# Patient Record
Sex: Male | Born: 1945
Health system: Southern US, Community
[De-identification: ages and names within clinical notes are randomized; demographics above are authoritative.]

## PROBLEM LIST (undated history)

## (undated) DIAGNOSIS — H409 Unspecified glaucoma: Secondary | ICD-10-CM

## (undated) DIAGNOSIS — R001 Bradycardia, unspecified: Secondary | ICD-10-CM

## (undated) DIAGNOSIS — E785 Hyperlipidemia, unspecified: Secondary | ICD-10-CM

## (undated) DIAGNOSIS — F17200 Nicotine dependence, unspecified, uncomplicated: Secondary | ICD-10-CM

## (undated) DIAGNOSIS — I739 Peripheral vascular disease, unspecified: Secondary | ICD-10-CM

## (undated) DIAGNOSIS — I10 Essential (primary) hypertension: Secondary | ICD-10-CM

## (undated) HISTORY — DX: Hyperlipidemia, unspecified: E78.5

## (undated) HISTORY — DX: Bradycardia, unspecified: R00.1

## (undated) HISTORY — PX: REFRACTIVE SURGERY: SHX103

## (undated) HISTORY — DX: Nicotine dependence, unspecified, uncomplicated: F17.200

## (undated) HISTORY — DX: Unspecified glaucoma: H40.9

## (undated) HISTORY — DX: Peripheral vascular disease, unspecified: I73.9

---

## 2013-12-24 ENCOUNTER — Ambulatory Visit
Admission: RE | Admit: 2013-12-24 | Discharge: 2013-12-24 | Disposition: A | Discharge status: Home / Self Care | Payer: Medicare HMO | Source: Ambulatory Visit | Attending: Cardiology | Admitting: Cardiology

## 2013-12-24 ENCOUNTER — Other Ambulatory Visit: Payer: Self-pay | Admitting: Cardiology

## 2013-12-24 DIAGNOSIS — Z129 Encounter for screening for malignant neoplasm, site unspecified: Secondary | ICD-10-CM

## 2014-01-24 DIAGNOSIS — I10 Essential (primary) hypertension: Secondary | ICD-10-CM | POA: Diagnosis not present

## 2014-01-24 DIAGNOSIS — E780 Pure hypercholesterolemia: Secondary | ICD-10-CM | POA: Diagnosis not present

## 2014-01-24 DIAGNOSIS — Z136 Encounter for screening for cardiovascular disorders: Secondary | ICD-10-CM | POA: Diagnosis not present

## 2014-02-07 DIAGNOSIS — I739 Peripheral vascular disease, unspecified: Secondary | ICD-10-CM | POA: Diagnosis not present

## 2014-02-07 DIAGNOSIS — F17200 Nicotine dependence, unspecified, uncomplicated: Secondary | ICD-10-CM | POA: Diagnosis not present

## 2014-02-07 DIAGNOSIS — E780 Pure hypercholesterolemia: Secondary | ICD-10-CM | POA: Diagnosis not present

## 2014-02-07 DIAGNOSIS — I10 Essential (primary) hypertension: Secondary | ICD-10-CM | POA: Diagnosis not present

## 2014-02-15 DIAGNOSIS — I739 Peripheral vascular disease, unspecified: Secondary | ICD-10-CM | POA: Diagnosis not present

## 2014-02-24 DIAGNOSIS — E780 Pure hypercholesterolemia: Secondary | ICD-10-CM | POA: Diagnosis not present

## 2014-02-24 DIAGNOSIS — I739 Peripheral vascular disease, unspecified: Secondary | ICD-10-CM | POA: Diagnosis not present

## 2014-02-24 DIAGNOSIS — I10 Essential (primary) hypertension: Secondary | ICD-10-CM | POA: Diagnosis not present

## 2014-02-24 DIAGNOSIS — F17200 Nicotine dependence, unspecified, uncomplicated: Secondary | ICD-10-CM | POA: Diagnosis not present

## 2014-03-08 DIAGNOSIS — I10 Essential (primary) hypertension: Secondary | ICD-10-CM | POA: Diagnosis not present

## 2014-03-08 DIAGNOSIS — E780 Pure hypercholesterolemia: Secondary | ICD-10-CM | POA: Diagnosis not present

## 2014-03-08 DIAGNOSIS — Z01810 Encounter for preprocedural cardiovascular examination: Secondary | ICD-10-CM | POA: Diagnosis not present

## 2014-03-15 ENCOUNTER — Encounter (HOSPITAL_COMMUNITY)
Admission: RE | Disposition: A | Discharge status: Home / Self Care | Payer: Self-pay | Source: Ambulatory Visit | Attending: Cardiology

## 2014-03-15 ENCOUNTER — Encounter (HOSPITAL_COMMUNITY): Payer: Self-pay | Admitting: Cardiology

## 2014-03-15 ENCOUNTER — Ambulatory Visit (HOSPITAL_COMMUNITY)
Admission: RE | Admit: 2014-03-15 | Discharge: 2014-03-15 | Disposition: A | Discharge status: Home / Self Care | Payer: Commercial Managed Care - HMO | Source: Ambulatory Visit | Attending: Cardiology | Admitting: Cardiology

## 2014-03-15 DIAGNOSIS — J449 Chronic obstructive pulmonary disease, unspecified: Secondary | ICD-10-CM | POA: Diagnosis not present

## 2014-03-15 DIAGNOSIS — I70213 Atherosclerosis of native arteries of extremities with intermittent claudication, bilateral legs: Secondary | ICD-10-CM | POA: Insufficient documentation

## 2014-03-15 DIAGNOSIS — Z79899 Other long term (current) drug therapy: Secondary | ICD-10-CM | POA: Insufficient documentation

## 2014-03-15 DIAGNOSIS — I739 Peripheral vascular disease, unspecified: Secondary | ICD-10-CM | POA: Diagnosis not present

## 2014-03-15 DIAGNOSIS — F17210 Nicotine dependence, cigarettes, uncomplicated: Secondary | ICD-10-CM | POA: Diagnosis not present

## 2014-03-15 DIAGNOSIS — I7092 Chronic total occlusion of artery of the extremities: Secondary | ICD-10-CM | POA: Diagnosis not present

## 2014-03-15 DIAGNOSIS — I10 Essential (primary) hypertension: Secondary | ICD-10-CM | POA: Diagnosis not present

## 2014-03-15 DIAGNOSIS — E780 Pure hypercholesterolemia: Secondary | ICD-10-CM | POA: Insufficient documentation

## 2014-03-15 DIAGNOSIS — Z7982 Long term (current) use of aspirin: Secondary | ICD-10-CM | POA: Diagnosis not present

## 2014-03-15 HISTORY — PX: LOWER EXTREMITY ANGIOGRAM: SHX5508

## 2014-03-15 HISTORY — PX: INSERTION OF ILIAC STENT: SHX6256

## 2014-03-15 SURGERY — ANGIOGRAM, LOWER EXTREMITY
Anesthesia: LOCAL

## 2014-03-15 MED ORDER — LIDOCAINE HCL (PF) 1 % IJ SOLN
INTRAMUSCULAR | Status: AC
  Filled 2014-03-15: qty 30

## 2014-03-15 MED ORDER — SODIUM CHLORIDE 0.9 % IV SOLN: INTRAVENOUS | Status: DC

## 2014-03-15 MED ORDER — MIDAZOLAM HCL 2 MG/2ML IJ SOLN
INTRAMUSCULAR | Status: AC
  Filled 2014-03-15: qty 2

## 2014-03-15 MED ORDER — NITROGLYCERIN IN D5W 200-5 MCG/ML-% IV SOLN
INTRAVENOUS | Status: AC
  Filled 2014-03-15: qty 250

## 2014-03-15 MED ORDER — HEPARIN (PORCINE) IN NACL 2-0.9 UNIT/ML-% IJ SOLN
INTRAMUSCULAR | Status: AC
  Filled 2014-03-15: qty 1000

## 2014-03-15 MED ORDER — HEPARIN SODIUM (PORCINE) 1000 UNIT/ML IJ SOLN
INTRAMUSCULAR | Status: AC
  Filled 2014-03-15: qty 1

## 2014-03-15 MED ORDER — CLOPIDOGREL BISULFATE 300 MG PO TABS
ORAL_TABLET | ORAL | Status: AC
  Filled 2014-03-15: qty 2

## 2014-03-15 MED ORDER — SODIUM CHLORIDE 0.9 % IV SOLN: 1.0000 mL/kg/h | INTRAVENOUS | Status: DC

## 2014-03-15 MED ORDER — HEPARIN (PORCINE) IN NACL 2-0.9 UNIT/ML-% IJ SOLN
INTRAMUSCULAR | Status: AC
  Filled 2014-03-15: qty 500

## 2014-03-15 MED ORDER — HYDROMORPHONE HCL 1 MG/ML IJ SOLN
INTRAMUSCULAR | Status: AC
  Filled 2014-03-15: qty 1

## 2014-03-15 MED ORDER — VERAPAMIL HCL 2.5 MG/ML IV SOLN
INTRAVENOUS | Status: AC
  Filled 2014-03-15: qty 2

## 2014-03-15 NOTE — Discharge Instructions (Signed)
Angiogram, Care After °Refer to this sheet in the next few weeks. These instructions provide you with information on caring for yourself after your procedure. Your health care provider may also give you more specific instructions. Your treatment has been planned according to current medical practices, but problems sometimes occur. Call your health care provider if you have any problems or questions after your procedure.  °WHAT TO EXPECT AFTER THE PROCEDURE °After your procedure, it is typical to have the following sensations: °· Minor discomfort or tenderness and a small bump at the catheter insertion site. The bump should usually decrease in size and tenderness within 1 to 2 weeks. °· Any bruising will usually fade within 2 to 4 weeks. °HOME CARE INSTRUCTIONS  °· You may need to keep taking blood thinners if they were prescribed for you. Take medicines only as directed by your health care provider. °· Do not apply powder or lotion to the site. °· Do not take baths, swim, or use a hot tub until your health care provider approves. °· You may shower 24 hours after the procedure. Remove the bandage (dressing) and gently wash the site with plain soap and water. Gently pat the site dry. °· Inspect the site at least twice daily. °· Limit your activity for the first 48 hours. Do not bend, squat, or lift anything over 20 lb (9 kg) or as directed by your health care provider. °· Plan to have someone take you home after the procedure. Follow instructions about when you can drive or return to work. °SEEK MEDICAL CARE IF: °· You get light-headed when standing up. °· You have drainage (other than a small amount of blood on the dressing). °· You have chills. °· You have a fever. °· You have redness, warmth, swelling, or pain at the insertion site. °SEEK IMMEDIATE MEDICAL CARE IF:  °· You develop chest pain or shortness of breath, feel faint, or pass out. °· You have bleeding, swelling larger than a walnut, or drainage from the  catheter insertion site. °· You develop pain, discoloration, coldness, or severe bruising in the leg or arm that held the catheter. °· You have heavy bleeding from the site. If this happens, hold pressure on the site and call 911. °MAKE SURE YOU: °· Understand these instructions. °· Will watch your condition. °· Will get help right away if you are not doing well or get worse. °Document Released: 07/12/2004 Document Revised: 05/10/2013 Document Reviewed: 05/18/2012 °ExitCare® Patient Information ©2015 [employer]. This information is not intended to replace advice given to you by your health care provider. Make sure you discuss any questions you have with your health care provider. ° °

## 2014-03-15 NOTE — Interval H&P Note (Signed)
History and Physical Interval Note:  03/15/2014 7:50 AM  Timothy Webster  has presented today for surgery, with the diagnosis of pvd  The various methods of treatment have been discussed with the patient and family. After consideration of risks, benefits and other options for treatment, the patient has consented to  Procedure(s): LOWER EXTREMITY ANGIOGRAM (N/A) and possible PTA as a surgical intervention .  The patient's history has been reviewed, patient examined, no change in status, stable for surgery.  I have reviewed the patient's chart and labs.  Questions were answered to the patient's satisfaction.     Pamella PertGANJI,[name]

## 2014-03-15 NOTE — H&P (Signed)
  Please see office visit notes for complete details of HPI.  

## 2014-03-15 NOTE — CV Procedure (Signed)
Procedures performed:  1. Right femoral Arterial access 2. Abdominal aortogram, abdominal aortogram with bifemoral runoff. 3. Crossover from right to the left  femoral artery placement of catheter tip in the left common  femoral artery.  4. Left femoral arteriogram with distal runoff 5. PTA and stenting of the left external iliac artery with implantation of 2 overlapping 9.0 x 60 and 9.0 x 20 mm absolute Pro self-expanding stents. PTCA and diamondback atherectomy of the left distal CTO of left SFA with 1.5 mm CSI  Stealth Solid crown, PTA and atherectomy with the same crown of the left tibioperoneal trunk followed by PTA and balloon angioplasty of the left SFA with a 5.0 x 16 mm Saber balloon, and PTA and balloon angioplasty of the left tibioperoneal trunk with a 2.5 x 40 mm chocolate balloon. 6. Right femoral arteriogram and closure of right femoral arterial access with Perclose. Fairly complex and prolonged procedure due to multiple catheter exchange and wire exchange.   Indication: Claudication,  Abnormal LE arterial duplex.  patient is a 69 year old African-American male with history of hypertension, hyperlipidemia and tobacco use disorder who presents with severe lifestyle limiting claudication with markedly abnormal bilateral ABI at 0.3, due to symptoms of ongoing severe claudication, he was brought to the peripheral angiography suite to eval at his peripheral anatomy.   Peripheral arthrogram: No evidence of abdominal aneurysm. 2 renal arteries one on either side and they're widely patent.   Aortoiliac bifurcation was widely patent. there is mild atherosclerotic changes of the abdominal aorta. The right and left external iliac artery showed a 50-60% stenosis, careful pullback was performed across both the lesions, there was a 40 mmHg pressure gradient across the left iliac artery stenosis, there was no gradient across the right iliac artery.   Femoral arteriogram: the right SFA shows similar  diffuse calcification. Midsegment of the right SFA has a CTO. Below the right knee there is two-vessel runoff in the form of peroneal and posterior tibial artery. Anterior tibial artery is severely diffusely diseased and is occluded and reconstitutes via collaterals and fills faintly at the level of the ankle.  The left SFA shows severe diffuse calcification, there is a 60% stenosis in the midsegment followed by distal SFA which is CTO and calcified. Below the left knee, the anterior tibial artery is again severely diseased and calcified and occluded and reconstitutes via collaterals. The tibioperoneal trunk has a high-grade 99% calcific stenosis.   Interventional data:  SUCCESSFUL PTA and stenting of the left external iliac artery with implantation of 2 overlapping 9.0 x 60 and 9.0 x 20 mm absolute Pro self-expanding stents. PTCA and diamondback atherectomy of the left distal CTO of left SFA with 1.5 mm CSI  Stealth Solid crown, PTA and atherectomy with the same crown of the left tibioperoneal trunk followed by PTA and balloon angioplasty of the left SFA with a 5.0 x 16 mm Saber balloon, and PTA and balloon angioplasty of the left tibioperoneal trunk with a 2.5 x 40 mm chocolate balloon.  Recommendation: Patient due to symptoms of claudication will need right SFA angioplasty in a elective fashion.   TECHNIQUE OF THE  PROCEDURE: Under sterile precautions using a 5-French  rightal arterial access, a 5 Frenchpigtailatheter was advanced  into the ascending aorta and arteriogram was performed.  the catheter was then utilized to perform abdominal aortogram with bifemoral runoff.  Using the same catheter, I cross the left iliac artery with the help of a Versacore wire and pigtail catheter. The  catheter was then exchanged to a 5 [place] end hole catheter. The careful pullback across the stenosis in the iliac artery revealed a 40 mmHg pressure gradient.  I decided to proceed with intervention to the left  external iliac artery. A 45 cm 7 French Ansel sheath was advanced into the left common iliac artery and then angioplasty was performed via this catheter sheath. Direct stenting was performed with utilization of 2 overlapping self-expanding 9.0 x 60 and a 9.0 x 20 mm absolute Pro stents. The stents were postdilated with a 8.0 x 60 mm  Armada balloon which was performed at at 10 atmospheric pressure 2. The inflow the stent had to be stented again due to short length of the first stent that was placed and there was a lesion in the inflow. Excellent results was obtained.  Attention was then directed towards the right external iliac artery stenosis. Careful pullback was performed across the right iliac stenosis keeping the left femoral arterial access intact with palpable Versacore wire. It was not significant stenosis. Then I re-advance the sheath back into the left common femoral artery protecting the recently placed self-expanding stent carefully under fluoroscopic guidance. I utilized a 5 [place] KMP catheter to access the SFA and advanced the guidewire into the SFA.  Utilizing a 5 French glide cath and hole catheter, 300 cm 0.035 inch Glidewire, I carefully advanced to the site of CTO in the left distal SFA/proximal popliteal artery under fluoroscopic guidance. I was unable to cross the 5 French sheath at the site of CTO. Then I utilized a Cook CXR 4 [place] support catheter, with moderate amount of difficulty and was able to cross the CTO. Then I utilized 0.014 x 300 cm Viper guidewire and placed the tip of the guidewire carefully into the peroneal artery. Then utilizing 1.5 mm solid crown, careful atherectomy was performed of the left distal SFA and proximal popliteal artery followed by atherectomy of the tibioperoneal trunk. High-speed was utilized up to 120000 rotations per second in the popliteal SFA region and 90,000. Into the tibioperoneal trunk after having passed multiple times, intra-arterial  nitroglycerin was administered and angiography was performed. Excellent result was evident at atherectomy site without any evidence of dissection. I then utilized a 2.5 x 40 mm chocolate balloon to perform angioplasty of the left tibioperoneal trunk which was performed at 15 atmospheric pressure for 2 minutes. The same balloon was utilized to perform angioplasty at the distal SFA/proximal popliteal artery at 15 atmospheric pressure for 90 seconds. Following this I utilized a 5.0 x 60 mm Saber balloon and balloon angioplasty at 8 atmospheric pressure for 1 minute each 2 was performed at the site of atherectomy. I then utilized the same balloon to perform angioplasty at 50-60% stenosis in the midsegment of the SFA which was again performed at 7 atmospheric pressure for 1 minute each 2. Following this angiography revealed excellent result, brisk flow without evidence of any dissection. The guidewire was withdrawn, the Ancel sheath was withdrawn back into the right common femoral artery, right femoral arteriogram was performed and access closed with Perclose with excellent hemostasis. Patient tolerated the procedure well. There was no immediate competitions. A  348 mL of contrast was utilized for diagnostic and procedure.

## 2014-03-17 LAB — POCT ACTIVATED CLOTTING TIME
ACTIVATED CLOTTING TIME: 239 s
ACTIVATED CLOTTING TIME: 300 s
Activated Clotting Time: 263 seconds
Activated Clotting Time: 288 seconds

## 2014-03-23 DIAGNOSIS — I739 Peripheral vascular disease, unspecified: Secondary | ICD-10-CM | POA: Diagnosis not present

## 2014-03-23 DIAGNOSIS — F17200 Nicotine dependence, unspecified, uncomplicated: Secondary | ICD-10-CM | POA: Diagnosis not present

## 2014-03-23 DIAGNOSIS — I10 Essential (primary) hypertension: Secondary | ICD-10-CM | POA: Diagnosis not present

## 2014-04-20 DIAGNOSIS — I10 Essential (primary) hypertension: Secondary | ICD-10-CM | POA: Diagnosis not present

## 2014-04-20 DIAGNOSIS — I739 Peripheral vascular disease, unspecified: Secondary | ICD-10-CM | POA: Diagnosis not present

## 2014-04-23 NOTE — H&P (Signed)
Notes from office copied to [facility] L. Scobie Mar 31, 2014 11:37 AM Location: [facility] Patient #: [phone] DOB: [birth year] Married / Language: English / Race: Black or African American Male    History of Present Illness(Jagadeesh Tomasita Crumble, MD; 2014/03/31 8:55 PM) Patient words: 7-10 day F/U post cath  The patient is a 69 year old male who presents with peripheral vascular disease. Patient presents here for follow-up and evaluation of peripheral arterial disease. Underwent left iliac artery stenting and left SFA and walk smoke popliteal and tibioperoneal trunk up for me with 1.5 mm diamondback,. He has residual right SFA CTO and needs angioplasty for the same. He now presents here for follow-up.  His wife is present at the bedside, states that his symptoms of leg claudication has improved remarkably. He is now able to walk significantly much more than previously but still has mild limitations due to claudication of his right Calf. He has not noticed any bluish discoloration or ulcerations. He has reduced his smoking to about 2-3 cigarettes a day and is having difficulty quitting smoking. Presents here for follow-up. Has chronic mild dyspnea, but no chest pain or palpitation. No leg edema. States that he has not had any complications from the procedure.     Problem List/Past Medical(Charavina Reader; Mar 31, 2014 11:41 AM) Tobacco use disorder, severe, dependence (F17.200). 45 pack year history of smoking  Chest x-ray 12/24/2013: COPD. There is no evidence of pneumonia, CHF, or other acute cardiopulmonary abnormality. Hypercholesteremia (E78.0). Labs 12/24/2013: thyroid panel normal, total cholesterol 174, triglycerides 67, HDL 59, LDL 102 PAD (peripheral artery disease) (I73.9). Labs 03/08/2014: HB 12.5/HCT 38.7 with normocytic indices, PT 12.9, INR 0.97, creatinine 0.77, BMP normal  Lower extremity arterial duplex 02/15/2014: Study suggests  occlusion/near occlusion of the mid SFA bilaterally with severe calcific diffuse plaque. This exam reveals severely decreased perfusion of both the lower extremities, noted at the post tibial artery level with bilateral ABI of 0.37. Consider further work-up.  ABI 01/04/2014: This exam reveals moderately decreased perfusion of both the lower extremities, noted at the post tibial artery level. Right ABI 0.64 and left 0.52 with monophasic waveforms suggesting severe disease. Consider complete evaluation of the Lower extremities if clinically indicated. Essential hypertension, benign (I10). Labs 12/24/2013: Creatinine 0.85, CMP normal  Treadmill Exercise stress 01/24/2014: Indications: Assessment of Chest Pain. Conclusions: Negative for ischemia. Normal exercise tolerence. The patient exercised according to the Bruce protocol, Total time recorded 7 Min. 37 sec. achieving a max heart rate of 153 which was 101% of MPHR for age and 8.8 METS of work. Baseline NIBP was 126/64. Peak NIBP was 126/64 MaxSysp was: 160 MaxDiasp was: 64. The baseline ECG showed NSR,Normal ECG. During exercise there was no ST-T changes of ischemia. Symptoms: MPHR (100%) achieved. Mild dyspnea. Arrhythmia: Occasional PVC, PAC. Continue primary prevention.    Allergies(Charavina Reader; 03/31/2014 11:41 AM) No Known Drug Allergies. 12/24/2013    Family History(Charavina Reader; 03/31/2014 11:41 AM) Mother. Deceased. at age 8, from cancer. Father. Deceased. father died when pt was a small child, does not know any information on him Siblings. 2 brothers, younger    Social History(Charavina Reader; 31-Mar-2014 11:41 AM) Marital status. Married. Number of Children. 1. Current tobacco use. Current every day smoker. 1 ppd smoker, for 20 years; 1/2 PPD Non Drinker/No Alcohol Use Living Situation. Lives with spouse.    Past Surgical History(Charavina Reader; March 31, 2014 11:41 AM) None.  12/24/2013    Medication History(Charavina Reader; 03/31/14 11:42 AM) Cilostazol (  Tablet, 1 (  one) Tablet Oral two times daily, Taken starting 03/21/2014) Active. Amlodipine Besy-Benazepril HCl (10-40MG  Capsule, 1 (one) Oral daily, Taken starting 02/07/2014) Active. Aspirin (81MG  Tablet, 1 Oral daily) Active.    Diagnostic Studies History(Jagadeesh Tomasita Crumble Derin Matthes, MD; 03/23/2014 9:04 PM) Colonoscopy. 2010 Normal ABI's. 01/04/2014 This exam reveals moderately decreased perfusion of both the lower extremities, noted at the post tibial artery level. Right ABI 0.64 and left 0.52 with monophasic waveforms suggesting severe disease. Consider complete evaluation of the Lower extremities if clinically indicated. Treadmill stress test. 01/24/2014 Indications: Assessment of Chest Pain. Conclusions: Negative for ischemia. Normal exercise tolerence. The patient exercised according to the Bruce protocol, Total time recorded 7 Min. 37 sec. achieving a max heart rate of 153 which was 101% of MPHR for age and 8.8 METS of work. Baseline NIBP was 126/64. Peak NIBP was 126/64 MaxSysp was: 160 MaxDiasp was: 64. The baseline ECG showed NSR,Normal ECG. During exercise there was no ST-T changes of ischemia. Symptoms: MPHR (100%) achieved. Mild dyspnea. Arrhythmia: Occasional PVC, PAC. Continue primary prevention. Angiography. 03/15/2014 Peripheral arteriogram 03/15/2014: Left external iliac artery 9.0 x 60, 9.0 x 20 mm self-expanding stent. Left distal SFA and proximal popliteal and tibioperoneal trunk    Review of Systems(Jagadeesh Tomasita Crumble Reyne Falconi, MD; 03/23/2014 8:55 PM) General:Present- Feeling well. Not Present- Fatigue, Fever and Night Sweats. Skin:Not Present- Itching and Rash. HEENT:Not Present- Headache. Respiratory:Not Present- Difficulty Breathing. Cardiovascular:Present- Claudications. Not Present- Chest Pain, Fainting, Orthopnea and Swelling of Extremities. Gastrointestinal:Not  Present- Abdominal Pain, Constipation, Diarrhea, Nausea and Vomiting. Musculoskeletal:Not Present- Joint Swelling. Neurological:Not Present- Headaches. Hematology:Not Present- Blood Clots, Easy Bruising and Nose Bleed. All other systems negative    Vitals(Charavina Reader; 03/23/2014 11:41 AM) 03/23/2014 11:38 AM Weight: 126 lb Height: 66 in Body Surface Area: 1.63 m Body Mass Index: 20.34 kg/m Pulse: 63 (Regular) P.OX: 98% (Room air) BP: 124/68 (Sitting, Left Arm, Standard)     Physical Exam(Jagadeesh Tomasita Crumble Dorthula Bier, MD; 03/23/2014 9:02 PM) The physical exam findings are as follows:   General Mental Status- Alert. General Appearance- Cooperative and Appears stated age. Not in acute distress. Orientation- Oriented X3. Build & Nutrition- Lean and Well built.   Head and Neck Thyroid Gland Characteristics- no palpable nodules. no palpable enlargement.   Eye Cornea- Left- Opaque.   Chest and Lung Exam Inspection:Shape- Barrel-shaped. Palpation:Tender- No chest wall tenderness. Auscultation: Breath sounds:- Clear.   Cardiovascular Inspection:Jugular vein- Right- No Distention. Auscultation:Heart Sounds- S1 WNL, S2 WNL and No gallop present. Murmurs & Other Heart Sounds: Murmur:- No murmur.   Abdomen Palpation/Percussion:Palpation and Percussion of the abdomen reveal - Non Tender and No hepatosplenomegaly. Auscultation:Auscultation of the abdomen reveals - Bowel sounds normal.   Peripheral Vascular Lower Extremity:Inspection- Left- No Pigmentation or Varicose veins. Right- No Pigmentation or Varicose veins. Palpation:Temperature- Left- Normal. Right- Cool. Edema- Bilateral- No edema. Femoral pulse- Left- Normal. Right- Normal. Bilateral- Bruit. Popliteal pulse- Bilateral- 0+. Dorsalis pedis pulse- Left- 1/2+. Right- 0+. Posterior tibial pulse- Bilateral- 0+. Carotid arteries- Left- No Carotid bruit.  Right- No Carotid bruit. Abdomen- No prominent abdominal aortic pulsation or epigastric bruit.   Neurologic Motor:- Grossly intact without any focal deficits.   Musculoskeletal Global Assessment Left Lower Extremity - normal range of motion without pain. Right Lower Extremity- normal range of motion without pain.    Assessment & Plan(Jagadeesh Tomasita Crumble Riannon Mukherjee, MD; 03/23/2014 9:03 PM) PAD (peripheral artery disease) (I73.9) Story: Lower extremity arterial duplex 02/15/2014: Study suggests occlusion/near occlusion of the mid SFA bilaterally with severe calcific diffuse plaque. This exam  reveals severely decreased perfusion of both the lower extremities, noted at the post tibial artery level with bilateral ABI of 0.37. Consider further work-up.  ABI 01/04/2014: This exam reveals moderately decreased perfusion of both the lower extremities, noted at the post tibial artery level. Right ABI 0.64 and left 0.52 with monophasic waveforms suggesting severe disease. Consider complete evaluation of the Lower extremities if clinically indicated. Impression: Peripheral arteriogram 03/15/2014: Left external iliac artery 9.0 x 60, 9.0 x 20 mm self-expanding stent. Left distal SFA and proximal popliteal and tibioperoneal trunk atherectomy with CSI 1.5 mm Crown, balloon angioplasty of the same. Right SFA CTO, needs angioplasty. 2 was runoff below the knee in the form of posterior tibial and peroneal arteries. Current Plans l Continued Cilostazol , 1 (one) Tablet Tablet two times daily, Mail Order #[id number], 03/23/2014, Ref. x1. l Restarted Pravastatin Sodium , 1 Tablet at bedtime, #30, 30 days starting 03/23/2014, No Refill, Mail Order #90, Ref. x1.  Essential hypertension, benign (I10) Story: Labs 03/08/2014: HB 12.5/HCT 38.7 with normocytic indices, PT 12.9, INR 0.97, creatinine 0.77, BMP normal  Treadmill Exercise stress 01/24/2014: Indications: Assessment of Chest Pain. Conclusions: Negative for  ischemia. Normal exercise tolerence. The patient exercised according to the Bruce protocol, Total time recorded 7 Min. 37 sec. achieving a max heart rate of 153 which was 101% of MPHR for age and 8.8 METS of work. Baseline NIBP was 126/64. Peak NIBP was 126/64 MaxSysp was: 160 MaxDiasp was: 64. The baseline ECG showed NSR,Normal ECG. During exercise there was no ST-T changes of ischemia. Symptoms: MPHR (100%) achieved. Mild dyspnea. Arrhythmia: Occasional PVC, PAC. Continue primary prevention. Impression: EKG 12/24/2013: Marked sinus bradycardia at the rate of 39 bpm, normal U waves evident. Otherwise normal EKG.  Tobacco use disorder, severe, dependence (F17.200) Story: 45 pack year history of smoking  Chest x-ray 12/24/2013: COPD. There is no evidence of pneumonia, CHF, or other acute cardiopulmonary abnormality. Current Plans l Mechanism of underlying disease process and action of medications discussed with the patient. I discussed primary/secondary prevention and also dietary counceling was done. Patient presents to me for follow-up of peripheral arterial disease. He underwent peripheral arteriogram and successful angioplasty but complex angioplasty to his left iliac artery and also proximal popliteal and tibioperoneal trunk atherectomy. Since then he has noticed significant improvement in symptoms of claudication. He still has mild symptoms of claudication in his right leg. I discussed with him regarding proceeding with angiography, he would like to wait for 4-5 weeks for traveling plans the recommend up soon. He is the right foot is still cold but is not in any acute limb threatening ischemia, left foot is warm and has faint pedal pulses. I have again discussed with him regarding smoking cessation, offered him medication. Patient would like to try patches and he would like to by them on his own. I'll see him back after peripheral arteriogram and possible angioplasty, I will obtain CMP along with  lipid profile testing at that time. 03/23/2014  l Referred to [name] MD (Internal Medicine).  Addendum: Due to persistent symptoms of claudication right leg proceed with repeat angiography and possible angioplasty.

## 2014-04-25 MED ORDER — SODIUM CHLORIDE 0.9 % IV SOLN
INTRAVENOUS | Status: DC
Start: 2014-04-26 — End: 2014-04-26
  Administered 2014-04-26: 06:00:00 via INTRAVENOUS

## 2014-04-26 ENCOUNTER — Encounter (HOSPITAL_COMMUNITY): Payer: Self-pay | Admitting: Cardiology

## 2014-04-26 ENCOUNTER — Ambulatory Visit (HOSPITAL_COMMUNITY)
Admission: RE | Admit: 2014-04-26 | Discharge: 2014-04-26 | Disposition: A | Discharge status: Home / Self Care | Payer: Commercial Managed Care - HMO | Source: Ambulatory Visit | Attending: Cardiology | Admitting: Cardiology

## 2014-04-26 ENCOUNTER — Encounter (HOSPITAL_COMMUNITY)
Admission: RE | Disposition: A | Discharge status: Home / Self Care | Payer: Self-pay | Source: Ambulatory Visit | Attending: Cardiology

## 2014-04-26 DIAGNOSIS — E780 Pure hypercholesterolemia: Secondary | ICD-10-CM | POA: Diagnosis not present

## 2014-04-26 DIAGNOSIS — I10 Essential (primary) hypertension: Secondary | ICD-10-CM | POA: Insufficient documentation

## 2014-04-26 DIAGNOSIS — J449 Chronic obstructive pulmonary disease, unspecified: Secondary | ICD-10-CM | POA: Insufficient documentation

## 2014-04-26 DIAGNOSIS — I739 Peripheral vascular disease, unspecified: Secondary | ICD-10-CM | POA: Insufficient documentation

## 2014-04-26 DIAGNOSIS — I70211 Atherosclerosis of native arteries of extremities with intermittent claudication, right leg: Secondary | ICD-10-CM | POA: Diagnosis not present

## 2014-04-26 DIAGNOSIS — F17210 Nicotine dependence, cigarettes, uncomplicated: Secondary | ICD-10-CM | POA: Insufficient documentation

## 2014-04-26 DIAGNOSIS — I771 Stricture of artery: Secondary | ICD-10-CM | POA: Diagnosis not present

## 2014-04-26 HISTORY — PX: LOWER EXTREMITY ANGIOGRAM: SHX5508

## 2014-04-26 LAB — POCT ACTIVATED CLOTTING TIME
ACTIVATED CLOTTING TIME: 257 s
ACTIVATED CLOTTING TIME: 190 s
Activated Clotting Time: 251 seconds
Activated Clotting Time: 319 seconds
Activated Clotting Time: 165 seconds

## 2014-04-26 SURGERY — ANGIOGRAM, LOWER EXTREMITY
Anesthesia: LOCAL | Laterality: Right

## 2014-04-26 MED ORDER — HEPARIN SODIUM (PORCINE) 1000 UNIT/ML IJ SOLN
INTRAMUSCULAR | Status: AC
  Filled 2014-04-26: qty 1

## 2014-04-26 MED ORDER — HEPARIN (PORCINE) IN NACL 2-0.9 UNIT/ML-% IJ SOLN
INTRAMUSCULAR | Status: AC
Start: 2014-04-26 — End: 2014-04-26
  Filled 2014-04-26: qty 500

## 2014-04-26 MED ORDER — NITROGLYCERIN IN D5W 200-5 MCG/ML-% IV SOLN
INTRAVENOUS | Status: AC
  Filled 2014-04-26: qty 250

## 2014-04-26 MED ORDER — VERAPAMIL HCL 2.5 MG/ML IV SOLN
INTRAVENOUS | Status: AC
  Filled 2014-04-26: qty 2

## 2014-04-26 MED ORDER — HEPARIN (PORCINE) IN NACL 2-0.9 UNIT/ML-% IJ SOLN
INTRAMUSCULAR | Status: AC
  Filled 2014-04-26: qty 1000

## 2014-04-26 MED ORDER — CLOPIDOGREL BISULFATE 300 MG PO TABS
ORAL_TABLET | ORAL | Status: AC
  Filled 2014-04-26: qty 2

## 2014-04-26 MED ORDER — LIDOCAINE HCL (PF) 1 % IJ SOLN
INTRAMUSCULAR | Status: AC
  Filled 2014-04-26: qty 30

## 2014-04-26 MED ORDER — CLOPIDOGREL BISULFATE 75 MG PO TABS: 75.0000 mg | ORAL_TABLET | Freq: Every day | ORAL | Status: DC

## 2014-04-26 MED ORDER — HYDROMORPHONE HCL 1 MG/ML IJ SOLN
INTRAMUSCULAR | Status: AC
  Filled 2014-04-26: qty 1

## 2014-04-26 MED ORDER — FAMOTIDINE IN NACL 20-0.9 MG/50ML-% IV SOLN
INTRAVENOUS | Status: AC
  Filled 2014-04-26: qty 50

## 2014-04-26 MED ORDER — SODIUM CHLORIDE 0.9 % IV SOLN: 1.0000 mL/kg/h | INTRAVENOUS | Status: DC

## 2014-04-26 MED ORDER — MIDAZOLAM HCL 2 MG/2ML IJ SOLN
INTRAMUSCULAR | Status: AC
  Filled 2014-04-26: qty 2

## 2014-04-26 NOTE — Interval H&P Note (Signed)
History and Physical Interval Note:  04/26/2014 7:47 AM  Timothy Webster  has presented today for surgery, with the diagnosis of pad  The various methods of treatment have been discussed with the patient and family. After consideration of risks, benefits and other options for treatment, the patient has consented to  Procedure and Medical resident Dr. Althea CharonKaramalegos for observation   [name]

## 2014-04-26 NOTE — Discharge Instructions (Signed)

## 2014-04-26 NOTE — CV Procedure (Addendum)
Procedure performed: Left femoral arterial access, cross over from the left into the right femoral artery, right femoral arteriogram with distal runoff, PTA and atherectomy of the right chronically occluded mid SFA and atherectomy of the proximal SFA and popliteal artery followed by balloon angioplasty. Extremely difficult, prolonged procedure and took approximately 2-1/2 hours due to complexity of the anatomy and multiple wire and devise exchanges.  Indication: Patient is a 69 year old African-American male with history of tobacco use disorder, known peripheral arterial disease with severe symptomatic claudication with severely decreased ABI bilaterally. ABI 0.3. He had undergone prior peripheral arteriogram and angioplasty 03/15/2014: Left external iliac artery 9.0 x 60 mm, 9.0 x 20 mm self-expanding stent, left distal SFA and proximal popliteal and tibioperoneal trunk atherectomy with CSI 1.5 mm solid crown.   Patient has residual right SFA occlusion, heavily calcified with diffuse disease. Due to symptomatic claudication and severely reduced ABI, he was now electively brought to the peripheral angiography suite to reevaluate his peripheral anatomy and attempted angioplasty if anatomy has not significantly changed.   Angiographic data: Right common and external iliac artery show moderate disease and calcification without high-grade stenosis. Right femoral artery has a calcific spicule in the proximal segment. Right SFA is severely diffusely diseased with heavy calcification followed by occlusion in the midsegment. It reconstitutes after a short segment occlusion, there is a large collateral that arises at the site of occlusion.  Below the knee, right ATA is occluded, right PT appears to fill at the level of the ankle and right peroneal is patent in the proximal segment and occluded or diffusely diseased in the midsegment distal segment. Very slow flow was evident and unable to completely evaluate the  anatomy due to markedly reduced flow.  Interventional data: Successful PTA, atherectomy of the right SFA and right popliteal artery with the CSI 1.5 mm solid crown atherectomy followed by balloon angioplasty with a 5.0 x 100 mm Saber. Stenosis reduced from 100% to less than 20% with brisk one-vessel runoff in the form of PT in the right lower extremity. The peroneal artery is also widely patent up to the mid to distal segment. ATA is occluded in the proximal segment.  Recommendation: Patient will need continued risk factor modification. He'll be discharged home today with outpatient follow-up. A total of 175 mL of contrast was utilized for diagnostic and interventional procedure.\  Technique: Under sterile precautions under fluoroscopic guidance, left femoral arterial access was obtained using syringe technique. A 7 French Ansel angled 45 cm sheath was advanced to the distal abdominal aorta and using a 5 French crossover catheter, I placed the Versacore wire into the right SFA. I then advanced the 5 [place] crossover catheter into the right external iliac artery. Because of inability to advance any further, a Glidewire had to be utilized to direct the catheter to the right SFA. The 7 French sheath was then placed in the right common femoral artery.  Using heparin 5 to correlation, maintaining his greater than 250, I then utilized a 5 [place] glide tip catheter to perform right femoral arteriogram with distal runoff. Then I utilized initially a whisper 0.014 x 300 cm wire and attempted to cross the CTO. I also utilized a angled CXI support catheter and with significant and great difficulty I was able to cross the flush occluded right SFA with the help of a MiracleBros 19 heavy wire, I was able to access the right distal SFA and right popliteal artery and enters the lumen. I was unable to  cross the CXI support catheter through the CTO, hence quick cross catheter was utilized to cross the CTO. The wire was  carefully positioned into the right popliteal artery distally. I then exchanged the heavy wire to a Viper advance guidewire, performed atherectomy with 1.5 mm solid crown. Multiple runs were made throughout the CTO and proximal to mid segment of SFA. This was followed by balloon angioplasty with a Saber 5.0 x 100 mm balloon at its atmospheric pressure for 3 minutes with gentle inflations. There was excellent result, after the angioplasty, proximal SFA and right proximal and mid popliteal artery high-grade stenosis and severe calcification were evident. Hence I performed atherectomy for the second time at these sites, followed by balloon angioplasty of the same balloon again at 6-7 atmospheric pressure for 3 minutes each this was followed by intra-arterial nitroglycerin administration and angiography. Excellent result with very brisk flow, one-vessel runoff below the knee, the peroneal artery is diseased distally, posterior tibial artery is widely patent. Anterior tibial is diffusely diseased and occluded in the proximal segment. Having performed this I withdrew the wire and the sheath back into the left femoral artery and exchanged it to a short 7 [place] sheath. Patient tolerated the procedure. The was no immediate competition.

## 2014-04-26 NOTE — Interval H&P Note (Signed)
History and Physical Interval Note:  04/26/2014 7:46 AM  Timothy Webster  has presented today for surgery, with the diagnosis of pad  The various methods of treatment have been discussed with the patient and family. After consideration of risks, benefits and other options for treatment, the patient has consented to  Procedure(s): LOWER EXTREMITY ANGIOGRAM (N/A) and possible PTA as a surgical intervention .  The patient's history has been reviewed, patient examined, no change in status, stable for surgery.  I have reviewed the patient's chart and labs.  Questions were answered to the patient's satisfaction.     Pamella PertGANJI,[name]

## 2014-04-26 NOTE — Progress Notes (Signed)
Site area: LFA Site Prior to Removal:  Level Risk analyst0Pressure Applied For:5725min Manual:  yes  Patient Status During Pull:  stable Post Pull Site:  Level 0 Post Pull Instructions Given:  yes Post Pull Pulses Present: palpable Dressing Applied:  clear Bedrest begins @ 1355 Comments:

## 2014-04-27 LAB — POCT ACTIVATED CLOTTING TIME: Activated Clotting Time: 282 seconds

## 2014-05-11 DIAGNOSIS — I739 Peripheral vascular disease, unspecified: Secondary | ICD-10-CM | POA: Diagnosis not present

## 2014-05-11 DIAGNOSIS — F17200 Nicotine dependence, unspecified, uncomplicated: Secondary | ICD-10-CM | POA: Diagnosis not present

## 2014-06-27 DIAGNOSIS — E785 Hyperlipidemia, unspecified: Secondary | ICD-10-CM | POA: Diagnosis not present

## 2014-06-27 DIAGNOSIS — I10 Essential (primary) hypertension: Secondary | ICD-10-CM | POA: Diagnosis not present

## 2014-06-29 DIAGNOSIS — I10 Essential (primary) hypertension: Secondary | ICD-10-CM | POA: Diagnosis not present

## 2014-06-29 DIAGNOSIS — E785 Hyperlipidemia, unspecified: Secondary | ICD-10-CM | POA: Diagnosis not present

## 2014-09-07 DIAGNOSIS — I739 Peripheral vascular disease, unspecified: Secondary | ICD-10-CM | POA: Diagnosis not present

## 2014-09-19 DIAGNOSIS — I739 Peripheral vascular disease, unspecified: Secondary | ICD-10-CM | POA: Diagnosis not present

## 2014-09-19 DIAGNOSIS — F17200 Nicotine dependence, unspecified, uncomplicated: Secondary | ICD-10-CM | POA: Diagnosis not present

## 2014-10-25 DIAGNOSIS — I10 Essential (primary) hypertension: Secondary | ICD-10-CM | POA: Diagnosis not present

## 2014-10-25 DIAGNOSIS — E785 Hyperlipidemia, unspecified: Secondary | ICD-10-CM | POA: Diagnosis not present

## 2014-11-28 DIAGNOSIS — Z6821 Body mass index (BMI) 21.0-21.9, adult: Secondary | ICD-10-CM | POA: Diagnosis not present

## 2014-11-28 DIAGNOSIS — N400 Enlarged prostate without lower urinary tract symptoms: Secondary | ICD-10-CM | POA: Diagnosis not present

## 2014-11-28 DIAGNOSIS — Z125 Encounter for screening for malignant neoplasm of prostate: Secondary | ICD-10-CM | POA: Diagnosis not present

## 2015-03-14 DIAGNOSIS — H5412 Blindness, left eye, low vision right eye: Secondary | ICD-10-CM | POA: Diagnosis not present

## 2015-03-14 DIAGNOSIS — I10 Essential (primary) hypertension: Secondary | ICD-10-CM | POA: Diagnosis not present

## 2015-03-14 DIAGNOSIS — E785 Hyperlipidemia, unspecified: Secondary | ICD-10-CM | POA: Diagnosis not present

## 2015-04-04 DIAGNOSIS — I739 Peripheral vascular disease, unspecified: Secondary | ICD-10-CM | POA: Diagnosis not present

## 2015-04-19 DIAGNOSIS — I10 Essential (primary) hypertension: Secondary | ICD-10-CM | POA: Diagnosis not present

## 2015-04-19 DIAGNOSIS — M7711 Lateral epicondylitis, right elbow: Secondary | ICD-10-CM | POA: Diagnosis not present

## 2015-04-19 DIAGNOSIS — I739 Peripheral vascular disease, unspecified: Secondary | ICD-10-CM | POA: Diagnosis not present

## 2015-04-19 DIAGNOSIS — E7800 Pure hypercholesterolemia, unspecified: Secondary | ICD-10-CM | POA: Diagnosis not present

## 2015-05-11 DIAGNOSIS — E7800 Pure hypercholesterolemia, unspecified: Secondary | ICD-10-CM | POA: Diagnosis not present

## 2015-05-11 DIAGNOSIS — I739 Peripheral vascular disease, unspecified: Secondary | ICD-10-CM | POA: Diagnosis not present

## 2015-05-14 NOTE — H&P (Signed)
OFFICE VISIT NOTES COPIED TO EPIC FOR DOCUMENTATION  .  History of Present Illness Timothy Webster([name]. Jidenna Figgs MD; 04/19/2015 4:17 PM) Patient words: Last O/V 09/19/2014; 6 Mth F/U for PAD.  The patient is a 70 year old male who presents for a Follow-up for Peripheral vascular disease. Patient presents here for follow-up and evaluation of peripheral arterial disease. On 03/25/2014, he underwent left iliac artery stenting and left SFA and proximal popliteal and tibioperoneal trunk atherectomy with 1.5 mm diamondback and on 04/26/2014 he underwent atherectomy and balloon angioplasty of the right SFA and right popliteal artery.  He states that his symptoms of leg claudication have improved but states that he still has some cramping and severe burning in both calves right slightly worse than left with activity, that appear to have comeback since last OV 3 months ago. He has not noticed any bluish discoloration or ulcerations. He has quit smoking since second procedure. Has chronic mild dyspnea, but no chest pain or palpitation. No leg edema.   Problem List/Past Medical (April Rinaldo RatelGarrison; 04/19/2015 3:41 PM) Essential hypertension, benign (I10)  Treadmill Exercise stress 01/24/2014: Indications: Assessment of Chest Pain. Conclusions: Negative for ischemia. Normal exercise tolerence. The patient exercised according to the Bruce protocol, Total time recorded 7 Min. 37 sec. achieving a max heart rate of 153 which was 101% of MPHR for age and 8.8 METS of work. Baseline NIBP was 126/64. Peak NIBP was 126/64 MaxSysp was: 160 MaxDiasp was: 64. The baseline ECG showed NSR,Normal ECG. During exercise there was no ST-T changes of ischemia. Symptoms: MPHR (100%) achieved. Mild dyspnea. Arrhythmia: Occasional PVC, PAC. Continue primary prevention. Hypercholesteremia (E78.00)  Tobacco use disorder, severe, dependence (F17.200)  45 pack year history of smoking Chest x-ray 12/24/2013: COPD. There is no evidence of pneumonia,  CHF, or other acute cardiopulmonary abnormality. PAD (peripheral artery disease) (I73.9)  Lower extremity arterial duplex 04/04/2015: Moderate velocity increase at the right proximal superficial femoral artery suggestive of > 50% stenosis. Severe calcific plaque throughout the left lower extremity. Study suggest restenosis of the right SFA angioplasty site. Right AT/PT appears occluded Left lower extremity arterial system shows severe calcific plaque. Left iliac and SFA angioplasty site appear patent. Left AT appears occluded. This exam reveals moderately decreased perfusion of the lower extremity bilaterally with ABI of 0.70 bilateral. Compared to the study done on 09/07/14, no significant change in ABI, but right SFA flow appears dampened now and suggests restenosis or progression of disease. Consider further w/u if clinically indicated. Lower extremity arterial duplex 09/07/2014: 1. Severe calcific plaque throughout biltaral lower extremity arteries. Right common femoral artery shows >50% stenosis. 2. No hemodynamically significant stenoses are identified in the left lower extremity arterial system. 3. This exam reveals moderately decreased perfusion of both lower extremities, noted at the post tibial artery level with bilateral ABI of 0.76. 4. Compared to the study done on ABI has improved from 0.37 bilaterally. Right SFA angioplasty site and left iliac stent and SFA angioplasty site patent. Clinical correlation recommended. Peripheral arteriogram 04/26/2014: Atherectomy with 1.5 mm CSI solid crown followed by balloon angioplasty of the right SFA CTO and right popliteal artery. AT occluded and T-P trunk 70-80% stenosis. Brisk two-vessel runoff with the diseased distal peroneal artery and normal dominant PT. 03/15/2014: Left external iliac artery 9.0 x 60, 9.0 x 20 mm self-expanding stent. Left distal SFA and proximal popliteal and tibioperoneal trunk atherectomy with CSI 1.5 mm Crown, balloon angioplasty of the  same.  Allergies (April Garrison; 04/19/2015 3:41 PM)  No Known Drug Allergies 12/24/2013  Family History (April Rinaldo Ratel; 05/04/15 3:41 PM) Mother  Deceased. at age 84, from cancer. Father  Deceased. father died when pt was a small child, does not know any information on him Siblings  2 brothers, younger  Social History (April Rinaldo Ratel; 05/04/2015 3:47 PM) Current tobacco use  Former smoker. Quit 01/2015 Non Drinker/No Alcohol Use  Marital status  Married. Number of Children  1. Living Situation  Lives with spouse.  Past Surgical History (April Rinaldo Ratel; 05/04/15 3:41 PM) None 12/24/2013  Medication History (April Garrison; 05/04/15 3:49 PM) Amlodipine Besy-Benazepril HCl (10-40MG  Capsule, 1 (one) Capsule Capsule Oral daily, Taken starting 08/18/2014) Active. Cilostazol (100MG  Tablet, 1 (one) Tablet Tablet Tablet Oral two times daily, Taken starting 03/23/2014) Active. Aspirin (81MG  Tablet, 1 Oral daily) Active. Pravastatin Sodium (20MG  Tablet, 1 Oral daily) Active. Medications Reconciled (Pt brought list)  Diagnostic Studies History (April Garrison; 05-04-15 3:45 PM) Loney Laurence  04/20/2014: Creatinine 0.77, CMP normal, HCT 12.7/HCT 38.4 with normocytic indices, total cholesterol 172, triglycerides 60, HDL 75, LDL 85, LDL particle # 1026, PT 13.2, INR 1.0 03/08/2014: HB 12.5/HCT 38.7 with normocytic indices, PT 12.9, INR 0.97, creatinine 0.77, BMP normal 12/24/2013: thyroid panel normal, total cholesterol 174, triglycerides 67, HDL 59, LDL 102 Lower Extremity Dopplers 04/04/2015 Moderate velocity increase at the right proximal superficial femoral artery suggestive of > 50% stenosis. Severe calcific plaque throughout the left lower extremity. Study suggest restenosis of the right SFA angioplasty site. Right AT/PT appears occluded Left lower extremity arterial system shows severe calcific plaque. Left iliac and SFA angioplasty site appear patent. Left AT appears occluded. This  exam reveals moderately decreased perfusion of the lower extremity bilaterally with ABI of 0.70 bilateral. Compared to the study done on 09/07/14, no significant change in ABI, but right SFA flow appears dampened now and suggests restenosis or progression of disease. Consider further w/u if clinically indicated Colonoscopy 2010 Normal Treadmill stress test 01/24/2014 Indications: Assessment of Chest Pain. Conclusions: Negative for ischemia. Normal exercise tolerence. The patient exercised according to the Bruce protocol, Total time recorded 7 Min. 37 sec. achieving a max heart rate of 153 which was 101% of MPHR for age and 8.8 METS of work. Baseline NIBP was 126/64. Peak NIBP was 126/64 MaxSysp was: 160 MaxDiasp was: 64. The baseline ECG showed NSR,Normal ECG. During exercise there was no ST-T changes of ischemia. Symptoms: MPHR (100%) achieved. Mild dyspnea. Arrhythmia: Occasional PVC, PAC. Continue primary prevention. Angiography 03/15/2014 Peripheral arteriogram 03/15/2014: Left external iliac artery 9.0 x 60, 9.0 x 20 mm self-expanding stent. Left distal SFA and proximal popliteal and tibioperoneal trunk Arteriogram 04/26/2014 Peripheral arteriogram : Atherectomy with 1.5 mm CSI solid ground followed by balloon angioplasty of the right SFA CTO and right popliteal artery. AT occluded and T-P trunk 70-80% stenosis. Brisk two-vessel runoff with the diseased distal peroneal artery and normal dominant PT    Review of Systems Timothy Pert MD; 05-04-2015 4:27 PM) General Present- Feeling well. Not Present- Fatigue, Fever and Night Sweats. Skin Not Present- Itching and Rash. HEENT Not Present- Headache. Respiratory Not Present- Difficulty Breathing. Cardiovascular Not Present- Chest Pain, Claudications, Fainting, Orthopnea and Swelling of Extremities. Gastrointestinal Not Present- Abdominal Pain, Constipation, Diarrhea, Nausea and Vomiting. Musculoskeletal Present- Back Pain (upper back pain and  right elbow pain continuous for 3 weeks). Not Present- Joint Swelling. Neurological Not Present- Headaches. Hematology Not Present- Blood Clots, Easy Bruising and Nose Bleed. All other systems negative  Vitals (April Garrison; May 04, 2015 3:51 PM)  04/19/2015 3:46 PM Weight: 132.19 lb Height: 66in Body Surface Area: 1.68 m Body Mass Index: 21.34 kg/m  Pulse: 65 (Regular)  P.OX: 98% (Room air) BP: 128/64 (Sitting, Left Arm, Standard)       Physical Exam Timothy Pert MD; 04/19/2015 4:24 PM) General Mental Status-Alert. General Appearance-Cooperative, Appears stated age, Not in acute distress. Orientation-Oriented X3. Build & Nutrition-Lean and Well built.  Head and Neck Thyroid Gland Characteristics - no palpable nodules, no palpable enlargement.  Eye Cornea - Left-Opaque.  Chest and Lung Exam Inspection Shape - Barrel-shaped. Palpation Tender - No chest wall tenderness. Auscultation Breath sounds - Clear.  Cardiovascular Inspection Jugular vein - Right - No Distention. Auscultation Heart Sounds - S1 WNL, S2 WNL and No gallop present. Murmurs & Other Heart Sounds - Murmur - No murmur.  Abdomen Palpation/Percussion Normal exam - Non Tender and No hepatosplenomegaly. Auscultation Normal exam - Bowel sounds normal.  Peripheral Vascular Lower Extremity Inspection - Bilateral - Loss of hair, No Pigmentation, No Varicose veins. Palpation - Temperature - Bilateral - Normal. Edema - Bilateral - No edema. Femoral pulse - Bilateral - Normal(loud bruit). Popliteal pulse - Bilateral - 0+. Dorsalis pedis pulse - Bilateral - Absent. Posterior tibial pulse - Bilateral - Absent. Carotid arteries - Bilateral-No Carotid bruit. Abdomen-No prominent abdominal aortic pulsation, No epigastric bruit.  Neurologic Motor-Grossly intact without any focal deficits.  Musculoskeletal Global Assessment Left Lower Extremity - normal range of motion without  pain. Right Lower Extremity - normal range of motion without pain.    Assessment & Plan Timothy Pert MD; 04/19/2015 7:07 PM)  PAD (peripheral artery disease) (I73.9) Story: Lower extremity arterial duplex 04/04/2015: Moderate velocity increase at the right proximal superficial femoral artery suggestive of > 50% stenosis. Severe calcific plaque throughout the left lower extremity. Study suggest restenosis of the right SFA angioplasty site. Right AT/PT appears occluded Left lower extremity arterial system shows severe calcific plaque. Left iliac and SFA angioplasty site appear patent. Left AT appears occluded. This exam reveals moderately decreased perfusion of the lower extremity bilaterally with ABI of 0.70 bilateral. Compared to the study done on 09/07/14, no significant change in ABI, but right SFA flow appears dampened now and suggests restenosis or progression of disease. Consider further w/u if clinically indicated.  Peripheral arteriogram 04/26/2014: Atherectomy with 1.5 mm CSI solid crown, balloon angioplasty of the right SFA CTO and right popliteal artery. AT occluded and T-P trunk 70-80% stenosis. Brisk two-vessel runoff with the diseased distal peroneal artery and normal dominant PT. 03/15/2014: Left external iliac artery 9.0 x 60, 9.0 x 20 mm self-expanding stent. Left distal SFA and proximal popliteal and tibioperoneal trunk atherectomy with CSI 1.5 mm Crown, balloon angioplasty of the same. Future Plans 05/08/2015: METABOLIC PANEL, BASIC [phone]) - one time 05/08/2015: CBC & PLATELETS (AUTO) (78295) - one time 05/08/2015: PT (PROTHROMBIN TIME) (62130) - one time Labwork Story: 04/20/2014: Creatinine 0.77, CMP normal, HCT 12.7/HCT 38.4 with normocytic indices, total cholesterol 172, triglycerides 60, HDL 75, LDL 85, LDL particle # 1026, PT 13.2, INR 1.0  03/08/2014: HB 12.5/HCT 38.7 with normocytic indices, PT 12.9, INR 0.97, creatinine 0.77, BMP normal  12/24/2013: thyroid panel normal,  total cholesterol 174, triglycerides 67, HDL 59, LDL 102 Essential hypertension, benign (I10) Story: Treadmill Exercise stress 01/24/2014: Indications: Assessment of Chest Pain. Conclusions: Negative for ischemia. Normal exercise tolerence. The patient exercised according to the Bruce protocol, Total time recorded 7 Min. 37 sec. achieving a max heart rate of 153 which  was 101% of MPHR for age and 8.8 METS of work. Baseline NIBP was 126/64. Peak NIBP was 126/64 MaxSysp was: 160 MaxDiasp was: 64. The baseline ECG showed NSR,Normal ECG. During exercise there was no ST-T changes of ischemia. Symptoms: MPHR (100%) achieved. Mild dyspnea. Arrhythmia: Occasional PVC, PAC. Continue primary prevention. Impression: EKG 04/19/2015: Normal sinus rhythm at rate of 52 bpm, normal axis. No evidence of ischemia otherwise normal EKG. No significant change from EKG 12/24/2013: Marked sinus bradycardia at the rate of 39 bpm. Right tennis elbow (M77.11) Current Plans Started TraMADol HCl , 1 (one) Tablet three times daily, as needed, #90, 04/19/2015, No Refill. Hypercholesteremia (E78.00) Future Plans 05/08/2015: LIPID PANEL (16109) - one time Tobacco use disorder, severe, dependence - Status is Resolved (F17.200) Story: 45 pack year history of smoking  Chest x-ray 12/24/2013: COPD. There is no evidence of pneumonia, CHF, or other acute cardiopulmonary abnormality. Current Plans Mechanism of underlying disease process and action of medications discussed with the patient. I discussed primary/secondary prevention and also dietary counseling was done. He see here on a six-month office visit and follow-up of referral arterial disease. He has no noticed recurrence of burning sensation in both his calf right worse than the left and physical examination and vascular Dopplers suggestive of progression of disease. He would like to proceed with repeat peripheral arteriogram as it has been lifestyle limiting and symptoms of  worse. Otherwise he is on appropriate medical therapy, he completely quit smoking which I congratulated him. I'll repeat lipid profile testing along with his precatheterization lab. No changes in the medications were done today. Mild tenderness in the lateral epicondyles suggestive of tennis elbow and also mild spraining in his upper back, prescribed him tramadol for the same.  CC Dr. [name].  Addendum Note(Bridgette Allison AGNP-C; 05/12/2015 3:05 PM) 05/11/2015: Total cholesterol 182, triglycerides 57, HDL 79, LDL 92, creatinine 0.88, potassium 4.6, CBC normal, PT/INR normal  Labs stable for PV angiogram Signed by Timothy Pert, MD (04/19/2015 7:08

## 2015-05-16 ENCOUNTER — Encounter (HOSPITAL_COMMUNITY)
Admission: RE | Disposition: A | Discharge status: Home / Self Care | Payer: Self-pay | Source: Ambulatory Visit | Attending: Cardiology

## 2015-05-16 ENCOUNTER — Ambulatory Visit (HOSPITAL_COMMUNITY)
Admission: RE | Admit: 2015-05-16 | Discharge: 2015-05-17 | Disposition: A | Discharge status: Home / Self Care | Payer: Commercial Managed Care - HMO | Source: Ambulatory Visit | Attending: Cardiology | Admitting: Cardiology

## 2015-05-16 DIAGNOSIS — I70213 Atherosclerosis of native arteries of extremities with intermittent claudication, bilateral legs: Secondary | ICD-10-CM | POA: Diagnosis not present

## 2015-05-16 DIAGNOSIS — E785 Hyperlipidemia, unspecified: Secondary | ICD-10-CM | POA: Insufficient documentation

## 2015-05-16 DIAGNOSIS — I10 Essential (primary) hypertension: Secondary | ICD-10-CM | POA: Insufficient documentation

## 2015-05-16 DIAGNOSIS — T82858A Stenosis of vascular prosthetic devices, implants and grafts, initial encounter: Secondary | ICD-10-CM | POA: Insufficient documentation

## 2015-05-16 DIAGNOSIS — Y838 Other surgical procedures as the cause of abnormal reaction of the patient, or of later complication, without mention of misadventure at the time of the procedure: Secondary | ICD-10-CM | POA: Insufficient documentation

## 2015-05-16 DIAGNOSIS — Z7982 Long term (current) use of aspirin: Secondary | ICD-10-CM | POA: Insufficient documentation

## 2015-05-16 DIAGNOSIS — I739 Peripheral vascular disease, unspecified: Secondary | ICD-10-CM | POA: Diagnosis not present

## 2015-05-16 DIAGNOSIS — Z87891 Personal history of nicotine dependence: Secondary | ICD-10-CM | POA: Insufficient documentation

## 2015-05-16 HISTORY — PX: PERIPHERAL VASCULAR CATHETERIZATION: SHX172C

## 2015-05-16 LAB — POCT ACTIVATED CLOTTING TIME
ACTIVATED CLOTTING TIME: 234 s
ACTIVATED CLOTTING TIME: 178 s
Activated Clotting Time: 219 seconds
Activated Clotting Time: 188 seconds

## 2015-05-16 SURGERY — LOWER EXTREMITY ANGIOGRAPHY
Anesthesia: LOCAL | Laterality: Right

## 2015-05-16 MED ORDER — IODIXANOL 320 MG/ML IV SOLN
INTRAVENOUS | Status: DC | PRN
  Administered 2015-05-16: 110 mL via INTRA_ARTERIAL

## 2015-05-16 MED ORDER — SODIUM CHLORIDE 0.9% FLUSH: 3.0000 mL | Freq: Two times a day (BID) | INTRAVENOUS | Status: DC

## 2015-05-16 MED ORDER — CLOPIDOGREL BISULFATE 300 MG PO TABS
ORAL_TABLET | ORAL | Status: DC | PRN
  Administered 2015-05-16: 600 mg via ORAL

## 2015-05-16 MED ORDER — LIDOCAINE HCL (PF) 1 % IJ SOLN
INTRAMUSCULAR | Status: AC
  Filled 2015-05-16: qty 30

## 2015-05-16 MED ORDER — NITROGLYCERIN 1 MG/10 ML FOR IR/CATH LAB
INTRA_ARTERIAL | Status: DC | PRN
  Administered 2015-05-16: 600 ug

## 2015-05-16 MED ORDER — HEPARIN (PORCINE) IN NACL 2-0.9 UNIT/ML-% IJ SOLN
INTRAMUSCULAR | Status: DC | PRN
  Administered 2015-05-16: 2000 mL

## 2015-05-16 MED ORDER — VERAPAMIL HCL 2.5 MG/ML IV SOLN
INTRAVENOUS | Status: AC
  Filled 2015-05-16: qty 2

## 2015-05-16 MED ORDER — HEPARIN SODIUM (PORCINE) 1000 UNIT/ML IJ SOLN
INTRAMUSCULAR | Status: AC
  Filled 2015-05-16: qty 1

## 2015-05-16 MED ORDER — SODIUM CHLORIDE 0.9 % IV SOLN: 250.0000 mL | INTRAVENOUS | Status: DC | PRN

## 2015-05-16 MED ORDER — CLOPIDOGREL BISULFATE 300 MG PO TABS
ORAL_TABLET | ORAL | Status: AC
  Filled 2015-05-16: qty 2

## 2015-05-16 MED ORDER — SODIUM CHLORIDE 0.9% FLUSH: 3.0000 mL | INTRAVENOUS | Status: DC | PRN

## 2015-05-16 MED ORDER — VIPERSLIDE LUBRICANT OPTIME
TOPICAL | Status: DC | PRN
  Administered 2015-05-16: 14:00:00 via SURGICAL_CAVITY

## 2015-05-16 MED ORDER — HEPARIN (PORCINE) IN NACL 2-0.9 UNIT/ML-% IJ SOLN
INTRAMUSCULAR | Status: AC
  Filled 2015-05-16: qty 1000

## 2015-05-16 MED ORDER — LIDOCAINE HCL (PF) 1 % IJ SOLN
INTRAMUSCULAR | Status: DC | PRN
  Administered 2015-05-16: 16 mL

## 2015-05-16 MED ORDER — SODIUM CHLORIDE 0.9 % IV SOLN: INTRAVENOUS | Status: DC

## 2015-05-16 MED ORDER — HYDRALAZINE HCL 20 MG/ML IJ SOLN
INTRAMUSCULAR | Status: AC
  Filled 2015-05-16: qty 1

## 2015-05-16 MED ORDER — HYDRALAZINE HCL 20 MG/ML IJ SOLN
10.0000 mg | Freq: Once | INTRAMUSCULAR | Status: AC
  Administered 2015-05-16: 10 mg via INTRAVENOUS

## 2015-05-16 MED ORDER — SODIUM CHLORIDE 0.9 % IV SOLN
INTRAVENOUS | Status: DC
Start: 2015-05-16 — End: 2015-05-16
  Administered 2015-05-16: 500 mL via INTRAVENOUS

## 2015-05-16 MED ORDER — HEPARIN SODIUM (PORCINE) 1000 UNIT/ML IJ SOLN
INTRAMUSCULAR | Status: DC | PRN
  Administered 2015-05-16: 2000 [IU] via INTRAVENOUS
  Administered 2015-05-16: 6000 [IU] via INTRAVENOUS

## 2015-05-16 MED ORDER — LABETALOL HCL 5 MG/ML IV SOLN: 10.0000 mg | Freq: Once | INTRAVENOUS | Status: DC

## 2015-05-16 MED ORDER — NITROGLYCERIN 1 MG/10 ML FOR IR/CATH LAB
INTRA_ARTERIAL | Status: DC | PRN
  Administered 2015-05-16: 600 ug via INTRA_ARTERIAL

## 2015-05-16 MED ORDER — CLONIDINE HCL 0.2 MG PO TABS
0.2000 mg | ORAL_TABLET | Freq: Two times a day (BID) | ORAL | Status: DC
  Administered 2015-05-16 – 2015-05-17 (×2): 0.2 mg via ORAL
  Filled 2015-05-16 (×2): qty 1

## 2015-05-16 MED ORDER — NITROGLYCERIN IN D5W 200-5 MCG/ML-% IV SOLN
INTRAVENOUS | Status: AC
  Filled 2015-05-16: qty 250

## 2015-05-16 SURGICAL SUPPLY — 18 items
BALLN LUTONIX 5X150X130 (BALLOONS) ×3
BALLOON LUTONIX 5X150X130 (BALLOONS) ×2 IMPLANT
CATH OMNI FLUSH 5F 65CM (CATHETERS) ×3 IMPLANT
DEVICE CONTINUOUS FLUSH (MISCELLANEOUS) ×3 IMPLANT
DIAMONDBACK SOLID OAS 1.5MM (CATHETERS) ×3
KIT ENCORE 26 ADVANTAGE (KITS) ×3 IMPLANT
KIT MICROINTRODUCER STIFF 5F (SHEATH) ×3 IMPLANT
KIT PV (KITS) ×3 IMPLANT
LUBRICANT VIPERSLIDE CORONARY (MISCELLANEOUS) ×3 IMPLANT
SHEATH PINNACLE 5F 10CM (SHEATH) ×3 IMPLANT
SHEATH PINNACLE ST 7F 45CM (SHEATH) ×3 IMPLANT
SYRINGE MEDRAD AVANTA MACH 7 (SYRINGE) ×3 IMPLANT
SYSTEM DIMNDBCK SLD OAS 1.5MM (CATHETERS) ×2 IMPLANT
TAPE RADIOPAQUE TURBO (MISCELLANEOUS) ×3 IMPLANT
TRANSDUCER W/STOPCOCK (MISCELLANEOUS) ×3 IMPLANT
TRAY PV CATH (CUSTOM PROCEDURE TRAY) ×3 IMPLANT
WIRE HITORQ VERSACORE ST 145CM (WIRE) ×3 IMPLANT
WIRE VIPER ADVANCE .017X335CM (WIRE) ×3 IMPLANT

## 2015-05-16 NOTE — Interval H&P Note (Signed)
History and Physical Interval Note:  05/16/2015 1:17 PM  Timothy Webster  has presented today for surgery, with the diagnosis of pvd  The various methods of treatment have been discussed with the patient and family. After consideration of risks, benefits and other options for treatment, the patient has consented to  Procedure(s): Lower Extremity Angiography (N/A) and possible PTA  as a surgical intervention .  The patient's history has been reviewed, patient examined, no change in status, stable for surgery.  I have reviewed the patient's chart and labs.  Questions were answered to the patient's satisfaction.     Yates DecampGANJI, Dieter Hane

## 2015-05-16 NOTE — Progress Notes (Signed)
Pt received from cath lab. Pt and wife oriented to unit and equipment. VSS, CCMD notified.   Dr. Jacinto HalimGanji updated wife via telephone at bedside. Received verbal order for heart healthy diet, clonidine, and NS at 3175mL/hr. Per Dr. Jacinto HalimGanji pt baseline HR is SB.   Call light within reach. Will continue to monitor.   Leonidas Rombergaitlin S Bumbledare, RN

## 2015-05-16 NOTE — Progress Notes (Signed)
Site area: LFA Site Prior to Removal:  Level 0 Pressure Applied For:20 imn Manual:   yes Patient Status During Pull:  Pt SBP to 84 during pressure post hydralazine. 200 cc bolus and mild trendelenburg Post Pull Site:  Level 0 Post Pull Instructions Given:  yes Post Pull Pulses Present: palpable Dressing Applied: tegaderm  Bedrest begins @ 1700  Till 2100 Comments:removed by [facility]

## 2015-05-17 ENCOUNTER — Encounter (HOSPITAL_COMMUNITY): Payer: Self-pay | Admitting: Cardiology

## 2015-05-17 DIAGNOSIS — Z87891 Personal history of nicotine dependence: Secondary | ICD-10-CM | POA: Diagnosis not present

## 2015-05-17 DIAGNOSIS — I70213 Atherosclerosis of native arteries of extremities with intermittent claudication, bilateral legs: Secondary | ICD-10-CM | POA: Diagnosis not present

## 2015-05-17 DIAGNOSIS — I10 Essential (primary) hypertension: Secondary | ICD-10-CM | POA: Diagnosis not present

## 2015-05-17 DIAGNOSIS — Z7982 Long term (current) use of aspirin: Secondary | ICD-10-CM | POA: Diagnosis not present

## 2015-05-17 DIAGNOSIS — T82858A Stenosis of vascular prosthetic devices, implants and grafts, initial encounter: Secondary | ICD-10-CM | POA: Diagnosis not present

## 2015-05-17 DIAGNOSIS — E785 Hyperlipidemia, unspecified: Secondary | ICD-10-CM | POA: Diagnosis not present

## 2015-05-17 MED ORDER — IBUPROFEN 600 MG PO TABS
600.0000 mg | ORAL_TABLET | Freq: Once | ORAL | Status: AC
  Administered 2015-05-17: 600 mg via ORAL
  Filled 2015-05-17: qty 1

## 2015-05-17 MED ORDER — CLONIDINE HCL 0.2 MG PO TABS: 0.2000 mg | ORAL_TABLET | Freq: Two times a day (BID) | ORAL | Status: DC

## 2015-05-17 MED ORDER — IBUPROFEN 600 MG PO TABS
600.0000 mg | ORAL_TABLET | Freq: Once | ORAL | Status: DC
  Filled 2015-05-17: qty 1

## 2015-05-17 NOTE — Discharge Summary (Signed)
Physician Discharge Summary  [name] MRN: [id number] DOB/AGE: 1945/09/14 70 y.o.  Admit date: 05/16/2015 Discharge date: 05/17/2015  Discharge Diagnoses: 1. PAD s/p orbital atherectomy with 1.5 mm CSI solid crown burr, followed by drug-coated balloon angioplasty with a 5 x 1 50 mm Lutonix balloon (H/O Peripheral arteriogram 04/26/2014: Atherectomy with 1.5 mm CSI solid crown, balloon angioplasty of the right SFA CTO and right popliteal artery. AT occluded and T-P trunk 70-80% stenosis. Brisk two-vessel runoff with the diseased distal peroneal artery and normal dominant PT. 03/15/2014: Left external iliac artery 9.0 x 60, 9.0 x 20 mm self-expanding stent. Left distal SFA and proximal popliteal and tibioperoneal trunk atherectomy with CSI 1.5 mm Crown, balloon angioplasty of the same). 2. HTN 3. HLD 4. H/O Tobacco use; quit 01/2015  Significant Diagnostic Studies: PV Angiogram 05/16/2015: 1. Abdominal aortogram reveals widely patent left external iliac artery stent placed on 03/25/2014. Right SFA in the midsegment showed high-grade 95% stenosis, tandem 70-80% stenosis proximally. Heavy calcification was evident. Below the right knee there was two-vessel runoff in the form of peroneal and posterior tibial artery. 2. Successful orbital atherectomy with 1.5 mm CSI solid crown burr, followed by drug-coated balloon angioplasty with a 5 x 1 50 mm Lutonix balloon, stenosis reduced from 99% to 0% with brisk two-vessel runoff below the knee, flow improved significantly post angioplasty. 3. Left femoral arteriogram with distal runoff: Proximal SFA shows a focal 80-90% stenosis, there is moderate to severe diffuse calcification, two-vessel runoff in the form of peroneal and posterior tibial artery. Brisk runoff was evident. The previously performed atherectomy for left distal SFA/proximal popliteal artery is widely patent.  Hospital Course:  He has a h/o HTN, HLD, tobacco use, and known PAD. On  03/25/2014, he underwent left iliac artery stenting and left SFA and proximal popliteal and tibioperoneal trunk atherectomy with 1.5 mm diamondback and on 04/26/2014 he underwent atherectomy and balloon angioplasty of the right SFA and right popliteal artery.  At his last follow up, he reported cramping and severe burning in both calves, right slightly worse than left with activity, that had become worse since he previous office visit.  He has not noticed any bluish discoloration or ulcerations.  He has quit smoking since second procedure.  Has chronic mild dyspnea, but no chest pain or palpitation.  No leg edema.  Vascular Dopplers suggestive of progression of disease, therefore decided to proceed with repeat peripheral arteriogram.  On 05/16/2015, he underwent successful orbital atherectomy with 1.5 mm CSI solid crown burr, followed by drug-coated balloon angioplasty with a 5 x 1 50 mm Lutonix balloon.  Recommendations on discharge: Patient has a focal lesion in the left proximal SFA which appears to be amenable for atherectomy and angioplasty. If symptoms of claudication persist will consider this on an elective fashion. Patient will need ABI in the outpatient basis post procedure within the next 2-3 weeks.  Otherwise he is on appropriate medical therapy, he has completely quit smoking. BP high in the hospital, will add and continue Clonidine for now and f/u in the OP setting.  Discharge Exam: Blood pressure 135/62, pulse 46, temperature 98.5 F (36.9 C), temperature source Oral, resp. rate 18, SpO2 100 %.    General appearance: alert, cooperative, appears stated age and no distress Resp: clear to auscultation bilaterally Cardio: regular rate and rhythm, S1, S2 normal, no murmur, click, rub or gallop GI: soft, non-tender; bowel sounds normal; no masses,  no organomegaly Extremities: extremities normal, atraumatic, no cyanosis  or edema and Warm, bilateral rappid capillary fill noted Pulses: Bilateral  carotids normal, bilateral femoral artery pulses normal. Popliteal pulse 1+. Dorsalis pedis absent bilaterally, PT 2+ right 1+ left. Left femoral arterial access site without any complications. FOLLOW UP PLANS AND APPOINTMENTS    Medication List    STOP taking these medications        clopidogrel 75 MG tablet  Commonly known as:  PLAVIX      TAKE these medications        acetaminophen 500 MG tablet  Commonly known as:  TYLENOL  Take 1,000 mg by mouth every 6 (six) hours as needed (pain).     amLODipine-benazepril 10-40 MG capsule  Commonly known as:  LOTREL  Take 1 capsule by mouth daily.     aspirin EC 81 MG tablet  Take 81 mg by mouth daily.     cilostazol 100 MG tablet  Commonly known as:  PLETAL  Take 100 mg by mouth 2 (two) times daily.     cloNIDine 0.2 MG tablet  Commonly known as:  CATAPRES  Take 1 tablet (0.2 mg total) by mouth 2 (two) times daily.     pravastatin 40 MG tablet  Commonly known as:  PRAVACHOL  Take 40 mg by mouth at bedtime.       Follow-up Information    Follow up with Yates DecampGANJI, Jodi Criscuolo, MD On 05/25/2015.   Specialty:  Cardiology   Why:  at 9:45am   Contact information:   [address] [place] [address] [phone]      Yates DecampJay Verlean Allport, MD 05/17/2015, 7:39 AM [facility] Pager: [phone] Office: [phone] If no answer: [phone]

## 2015-05-17 NOTE — Progress Notes (Signed)
Order received to discharge.  Telemetry removed and CCMD notified.  IV removed with catheter intact.  Discharge education given to Pt with wife at bedside.  All questions answered.  Pt denies pain to left leg, palpable pulse to left foot. Pt stable at discharge.

## 2015-05-25 DIAGNOSIS — M7711 Lateral epicondylitis, right elbow: Secondary | ICD-10-CM | POA: Diagnosis not present

## 2015-05-25 DIAGNOSIS — E7800 Pure hypercholesterolemia, unspecified: Secondary | ICD-10-CM | POA: Diagnosis not present

## 2015-05-25 DIAGNOSIS — I10 Essential (primary) hypertension: Secondary | ICD-10-CM | POA: Diagnosis not present

## 2015-05-25 DIAGNOSIS — I739 Peripheral vascular disease, unspecified: Secondary | ICD-10-CM | POA: Diagnosis not present

## 2015-06-13 DIAGNOSIS — I739 Peripheral vascular disease, unspecified: Secondary | ICD-10-CM | POA: Diagnosis not present

## 2015-07-05 DIAGNOSIS — I10 Essential (primary) hypertension: Secondary | ICD-10-CM | POA: Diagnosis not present

## 2015-07-05 DIAGNOSIS — R202 Paresthesia of skin: Secondary | ICD-10-CM | POA: Diagnosis not present

## 2015-07-05 DIAGNOSIS — E785 Hyperlipidemia, unspecified: Secondary | ICD-10-CM | POA: Diagnosis not present

## 2015-08-25 DIAGNOSIS — E7800 Pure hypercholesterolemia, unspecified: Secondary | ICD-10-CM | POA: Diagnosis not present

## 2015-08-25 DIAGNOSIS — I739 Peripheral vascular disease, unspecified: Secondary | ICD-10-CM | POA: Diagnosis not present

## 2015-08-25 DIAGNOSIS — R202 Paresthesia of skin: Secondary | ICD-10-CM | POA: Diagnosis not present

## 2015-08-25 DIAGNOSIS — I10 Essential (primary) hypertension: Secondary | ICD-10-CM | POA: Diagnosis not present

## 2015-09-08 DIAGNOSIS — E7800 Pure hypercholesterolemia, unspecified: Secondary | ICD-10-CM | POA: Diagnosis not present

## 2015-10-04 DIAGNOSIS — I10 Essential (primary) hypertension: Secondary | ICD-10-CM | POA: Diagnosis not present

## 2015-10-04 DIAGNOSIS — Z23 Encounter for immunization: Secondary | ICD-10-CM | POA: Diagnosis not present

## 2015-10-04 DIAGNOSIS — E785 Hyperlipidemia, unspecified: Secondary | ICD-10-CM | POA: Diagnosis not present

## 2015-10-05 DIAGNOSIS — I739 Peripheral vascular disease, unspecified: Secondary | ICD-10-CM | POA: Diagnosis not present

## 2015-10-12 DIAGNOSIS — R001 Bradycardia, unspecified: Secondary | ICD-10-CM | POA: Diagnosis not present

## 2015-10-12 DIAGNOSIS — E7800 Pure hypercholesterolemia, unspecified: Secondary | ICD-10-CM | POA: Diagnosis not present

## 2015-10-12 DIAGNOSIS — I739 Peripheral vascular disease, unspecified: Secondary | ICD-10-CM | POA: Diagnosis not present

## 2015-10-12 DIAGNOSIS — R202 Paresthesia of skin: Secondary | ICD-10-CM | POA: Diagnosis not present

## 2015-12-19 DIAGNOSIS — R079 Chest pain, unspecified: Secondary | ICD-10-CM | POA: Diagnosis not present

## 2015-12-19 DIAGNOSIS — I10 Essential (primary) hypertension: Secondary | ICD-10-CM | POA: Diagnosis not present

## 2015-12-28 DIAGNOSIS — Z0000 Encounter for general adult medical examination without abnormal findings: Secondary | ICD-10-CM | POA: Diagnosis not present

## 2016-02-05 DIAGNOSIS — E785 Hyperlipidemia, unspecified: Secondary | ICD-10-CM | POA: Diagnosis not present

## 2016-02-05 DIAGNOSIS — I10 Essential (primary) hypertension: Secondary | ICD-10-CM | POA: Diagnosis not present

## 2016-02-05 DIAGNOSIS — R21 Rash and other nonspecific skin eruption: Secondary | ICD-10-CM | POA: Diagnosis not present

## 2016-02-17 ENCOUNTER — Emergency Department (HOSPITAL_COMMUNITY): Payer: Medicare HMO

## 2016-02-17 ENCOUNTER — Emergency Department (HOSPITAL_COMMUNITY)
Admission: EM | Admit: 2016-02-17 | Discharge: 2016-02-17 | Disposition: A | Discharge status: Home / Self Care | Payer: Medicare HMO | Attending: Emergency Medicine | Admitting: Emergency Medicine

## 2016-02-17 ENCOUNTER — Encounter (HOSPITAL_COMMUNITY): Payer: Self-pay | Admitting: Emergency Medicine

## 2016-02-17 DIAGNOSIS — Z7982 Long term (current) use of aspirin: Secondary | ICD-10-CM | POA: Diagnosis not present

## 2016-02-17 DIAGNOSIS — I10 Essential (primary) hypertension: Secondary | ICD-10-CM | POA: Insufficient documentation

## 2016-02-17 DIAGNOSIS — Z79899 Other long term (current) drug therapy: Secondary | ICD-10-CM | POA: Insufficient documentation

## 2016-02-17 DIAGNOSIS — R0789 Other chest pain: Secondary | ICD-10-CM | POA: Diagnosis not present

## 2016-02-17 DIAGNOSIS — K429 Umbilical hernia without obstruction or gangrene: Secondary | ICD-10-CM | POA: Diagnosis not present

## 2016-02-17 DIAGNOSIS — R079 Chest pain, unspecified: Secondary | ICD-10-CM | POA: Diagnosis not present

## 2016-02-17 DIAGNOSIS — Z87891 Personal history of nicotine dependence: Secondary | ICD-10-CM | POA: Diagnosis not present

## 2016-02-17 DIAGNOSIS — R072 Precordial pain: Secondary | ICD-10-CM | POA: Diagnosis present

## 2016-02-17 DIAGNOSIS — I2510 Atherosclerotic heart disease of native coronary artery without angina pectoris: Secondary | ICD-10-CM | POA: Diagnosis not present

## 2016-02-17 DIAGNOSIS — R0602 Shortness of breath: Secondary | ICD-10-CM | POA: Diagnosis not present

## 2016-02-17 HISTORY — DX: Essential (primary) hypertension: I10

## 2016-02-17 LAB — HEPATIC FUNCTION PANEL
ALK PHOS: 49 U/L (ref 38–126)
ALT: 18 U/L (ref 17–63)
AST: 27 U/L (ref 15–41)
Albumin: 4.3 g/dL (ref 3.5–5.0)
Bilirubin, Direct: <0.1 mg/dL — ABNORMAL LOW (ref 0.1–0.5)
TOTAL PROTEIN: 7.5 g/dL (ref 6.5–8.1)
Total Bilirubin: 0.5 mg/dL (ref 0.3–1.2)

## 2016-02-17 LAB — BASIC METABOLIC PANEL
Anion gap: 9 (ref 5–15)
BUN: 14 mg/dL (ref 6–20)
CALCIUM: 9.6 mg/dL (ref 8.9–10.3)
CO2: 24 mmol/L (ref 22–32)
Chloride: 107 mmol/L (ref 101–111)
Creatinine, Ser: 0.91 mg/dL (ref 0.61–1.24)
GFR calc Af Amer: >60 mL/min (ref >60)
GLUCOSE: 123 mg/dL — AB (ref 65–99)
POTASSIUM: 3.3 mmol/L — AB (ref 3.5–5.1)
Sodium: 140 mmol/L (ref 135–145)

## 2016-02-17 LAB — I-STAT TROPONIN, ED: Troponin i, poc: 0 ng/mL (ref 0.00–0.08)

## 2016-02-17 LAB — CBC
HEMATOCRIT: 40.6 % (ref 39.0–52.0)
Hemoglobin: 13.4 g/dL (ref 13.0–17.0)
MCH: 30.6 pg (ref 26.0–34.0)
MCHC: 33 g/dL (ref 30.0–36.0)
MCV: 92.7 fL (ref 78.0–100.0)
Platelets: 219 10*3/uL (ref 150–400)
RBC: 4.38 MIL/uL (ref 4.22–5.81)
RDW: 14.5 % (ref 11.5–15.5)
WBC: 5.9 10*3/uL (ref 4.0–10.5)

## 2016-02-17 LAB — LIPASE, BLOOD: Lipase: 14 U/L (ref 11–51)

## 2016-02-17 MED ORDER — IOPAMIDOL (ISOVUE-370) INJECTION 76%
100.0000 mL | Freq: Once | INTRAVENOUS | Status: AC | PRN
  Administered 2016-02-17: 100 mL via INTRAVENOUS

## 2016-02-17 NOTE — ED Notes (Signed)
Dr Zammit in to assess 

## 2016-02-17 NOTE — ED Notes (Signed)
Awaiting reassessment and disposition 

## 2016-02-17 NOTE — ED Triage Notes (Signed)
Pt reports centralized CP X 2-3 weeks. States he began feeling dizzy today and has some SOB.

## 2016-02-17 NOTE — ED Notes (Signed)
Pt reports that he has had leg pain and chest pain for 2 weeks- he reports that he saw his pcp, Dr Loleta ChanceHill and told him he had leg pain and that they were burning and when he got up in hte morning it was like water running- When asked he was speaking of water inside of his leg, he reports that this is correct. He states he also spoke with Dr Loleta ChanceHill regarding his chest pain but when asked what the physician told him he states he didn't tell me nothing. He denies pain currently

## 2016-02-17 NOTE — ED Provider Notes (Signed)
[facility]Y DEPT Provider Note   CSN: [id number] Arrival date & time: 02/17/16  1555     History   Chief Complaint Chief Complaint  Patient presents with  . Chest Pain    HPI Timothy Webster is a 71 y.o. male.  Patient states he's been having some burning in his legs and some chest pain going into his back for a couple weeks. No pain now.   The history is provided by the patient. A language interpreter was used.  Chest Pain   This is a new problem. The current episode started yesterday. The problem occurs rarely. The problem has been resolved. The pain is associated with walking. The pain is present in the substernal region. The pain is at a severity of 2/10. The pain is mild. The quality of the pain is described as dull. Pertinent negatives include no abdominal pain, no back pain, no cough and no headaches.  Pertinent negatives for past medical history include no seizures.    Past Medical History:  Diagnosis Date  . Hypertension     Patient Active Problem List   Diagnosis Date Noted  . PVD (peripheral vascular disease) (HCC) 05/16/2015  . Peripheral artery disease (HCC) 04/26/2014  . Claudication in peripheral vascular disease (HCC) 03/14/2014    Past Surgical History:  Procedure Laterality Date  . INSERTION OF ILIAC STENT  03/15/2014   Procedure: INSERTION OF ILIAC STENT;  Surgeon: Yates Decamp, MD;  Location[facility] CATH LAB;  Service: Cardiovascular;;  LEIA   . LOWER EXTREMITY ANGIOGRAM N/A 03/15/2014   Procedure: LOWER EXTREMITY ANGIOGRAM;  Surgeon: Yates Decamp, MD;  Location: [facility] CATH LAB;  Service: Cardiovascular;  Laterality: N/A;  . LOWER EXTREMITY ANGIOGRAM Right 04/26/2014   Procedure: LOWER EXTREMITY ANGIOGRAM;  Surgeon: Yates Decamp, MD;  Location: [facility] CATH LAB;  Service: Cardiovascular;  Laterality: Right;  . PERIPHERAL VASCULAR CATHETERIZATION Bilateral 05/16/2015   Procedure: Lower Extremity Angiography;  Surgeon: Yates Decamp, MD;  Location[facility] INVASIVE CV LAB;  Service:  Cardiovascular;  Laterality: Bilateral;  . PERIPHERAL VASCULAR CATHETERIZATION Right 05/16/2015   Procedure: Peripheral Vascular Atherectomy;  Surgeon: Yates Decamp, MD;  Location: [facility] INVASIVE CV LAB;  Service: Cardiovascular;  Laterality: Right;  sfa       Home Medications    Prior to Admission medications   Medication Sig Start Date End Date Taking? Authorizing Provider  acetaminophen (TYLENOL) 500 MG tablet Take 1,000 mg by mouth every 6 (six) hours as needed (pain).   Yes Historical Provider, MD  amLODipine-benazepril (LOTREL) 10-40 MG per capsule Take 1 capsule by mouth daily.   Yes Historical Provider, MD  aspirin EC 81 MG tablet Take 81 mg by mouth daily.   Yes Historical Provider, MD  b complex vitamins tablet Take 1 tablet by mouth daily.   Yes Historical Provider, MD  cilostazol (PLETAL) 100 MG tablet Take 100 mg by mouth 2 (two) times daily.   Yes Historical Provider, MD  gabapentin (NEURONTIN) 300 MG capsule Take 1 capsule by mouth 2 (two) times daily. 02/05/16  Yes Historical Provider, MD  pravastatin (PRAVACHOL) 40 MG tablet Take 40 mg by mouth at bedtime.  02/07/14  Yes Historical Provider, MD    Family History History reviewed. No pertinent family history.  Social History Social History  Substance Use Topics  . Smoking status: Former Smoker    Years: 15.00    Quit date: 07/17/2015  . Smokeless tobacco: Never Used  . Alcohol use No     Allergies  Patient has no known allergies.   Review of Systems Review of Systems  Constitutional: Negative for appetite change and fatigue.  HENT: Negative for congestion, ear discharge and sinus pressure.   Eyes: Negative for discharge.  Respiratory: Negative for cough.   Cardiovascular: Positive for chest pain.  Gastrointestinal: Negative for abdominal pain and diarrhea.  Genitourinary: Negative for frequency and hematuria.  Musculoskeletal: Negative for back pain.  Skin: Negative for rash.  Neurological: Negative for seizures  and headaches.  Psychiatric/Behavioral: Negative for hallucinations.     Physical Exam Updated Vital Signs BP 145/69   Pulse 63   Temp 97.8 F (36.6 C) (Oral)   Resp 16   Ht 5\' 2"  (1.575 m)   Wt 125 lb (56.7 kg)   SpO2 98%   BMI 22.86 kg/m   Physical Exam  Constitutional: He is oriented to person, place, and time. He appears well-developed.  HENT:  Head: Normocephalic.  Eyes: Conjunctivae and EOM are normal. No scleral icterus.  Neck: Neck supple. No thyromegaly present.  Cardiovascular: Normal rate and regular rhythm.  Exam reveals no gallop and no friction rub.   No murmur heard. Pulmonary/Chest: No stridor. He has no wheezes. He has no rales. He exhibits no tenderness.  Abdominal: He exhibits no distension. There is no tenderness. There is no rebound.  Musculoskeletal: Normal range of motion. He exhibits no edema.  Lymphadenopathy:    He has no cervical adenopathy.  Neurological: He is oriented to person, place, and time. He exhibits normal muscle tone. Coordination normal.  Skin: No rash noted. No erythema.  Psychiatric: He has a normal mood and affect. His behavior is normal.     ED Treatments / Results  Labs (all labs ordered are listed, but only abnormal results are displayed) Labs Reviewed  BASIC METABOLIC PANEL - Abnormal; Notable for the following:       Result Value   Potassium 3.3 (*)    Glucose, Bld 123 (*)    All other components within normal limits  HEPATIC FUNCTION PANEL - Abnormal; Notable for the following:    Bilirubin, Direct <0.1 (*)    All other components within normal limits  CBC  LIPASE, BLOOD  I-STAT TROPOININ, ED    EKG  EKG Interpretation  Date/Time:  Saturday February 17 2016 16:04:24 EST Ventricular Rate:  62 PR Interval:    QRS Duration: 88 QT Interval:  430 QTC Calculation: 437 R Axis:   80 Text Interpretation:  Sinus rhythm Minimal ST depression, lateral leads Confirmed by Kyanna Mahrt  MD, Farran Amsden [phone]) on 02/17/2016 4:25:39  PM       Radiology Dg Chest 2 View  Result Date: 02/17/2016 CLINICAL DATA:  Chest pain, dizziness, shortness of breath EXAM: CHEST  2 VIEW COMPARISON:  12/24/2013 FINDINGS: There is no focal parenchymal opacity. There is no pleural effusion or pneumothorax. The heart and mediastinal contours are unremarkable. The osseous structures are unremarkable. IMPRESSION: No active cardiopulmonary disease. Electronically Signed   By: Elige Ko   On: 02/17/2016 16:52   Ct Angio Chest/abd/pel For Dissection W And/or Wo Contrast  Result Date: 02/17/2016 CLINICAL DATA:  Subacute onset of generalized chest pain, with acute onset of shortness of breath and dizziness. Initial encounter. EXAM: CT ANGIOGRAPHY CHEST, ABDOMEN AND PELVIS TECHNIQUE: Multidetector CT imaging through the chest, abdomen and pelvis was performed using the standard protocol during bolus administration of intravenous contrast. Multiplanar reconstructed images and MIPs were obtained and reviewed to evaluate the vascular anatomy. CONTRAST:  100  mL of Isovue 370 IV contrast COMPARISON:  Chest radiograph performed earlier today at 4:30 p.m. FINDINGS: CTA CHEST FINDINGS Cardiovascular: There is no evidence of aortic dissection. There is no evidence of aneurysmal dilatation. Mild calcific atherosclerotic disease is seen along the aortic arch. Scattered coronary artery calcifications are seen. There is no evidence of significant pulmonary embolus. The heart remains normal in size, with slight prominence of the right side of the heart. Mediastinum/Nodes: The mediastinum is unremarkable in appearance. No mediastinal lymphadenopathy is seen. No pericardial effusion is identified. A 1.9 cm hypodense nodule is seen within the right thyroid lobe. No axillary lymphadenopathy is seen. Lungs/Pleura: Minimal emphysema is seen at the lung apices. The lungs are otherwise clear. No focal consolidation, pleural effusion or pneumothorax is seen. No masses are  identified. Musculoskeletal: No acute osseous abnormalities are identified. The visualized musculature is unremarkable in appearance. Review of the MIP images confirms the above findings. CTA ABDOMEN AND PELVIS FINDINGS VASCULAR Aorta: Mild scattered calcification is seen along the abdominal aorta, without significant luminal narrowing. Celiac: Scattered calcification is seen at the origin of the celiac trunk, without significant luminal narrowing. SMA: The superior mesenteric artery is unremarkable in appearance, aside from minimal calcification distally. Renals: The renal arteries appear intact bilaterally, with mild calcification at the proximal renal arteries bilaterally. IMA: Scattered calcification is noted along the proximal inferior mesenteric artery, though it remains patent. Inflow: Scattered calcification is seen along the common, external internal iliac arteries bilaterally, with likely mild to moderate luminal narrowing along the external and internal iliac arteries bilaterally. Veins: The visualized venous structures are unremarkable. The inferior vena cava is grossly unremarkable in appearance. Review of the MIP images confirms the above findings. NON-VASCULAR Hepatobiliary: The liver is unremarkable in appearance. The gallbladder is unremarkable in appearance. The common bile duct remains normal in caliber. Pancreas: The pancreas is within normal limits. Spleen: The spleen is unremarkable in appearance. Adrenals/Urinary Tract: The adrenal glands are unremarkable in appearance. The kidneys are within normal limits. There is no evidence of hydronephrosis. No renal or ureteral stones are identified. No perinephric stranding is seen. Stomach/Bowel: The stomach is unremarkable in appearance. The small bowel is within normal limits. The appendix is normal in caliber, without evidence of appendicitis. The colon is unremarkable in appearance. Lymphatic: No retroperitoneal or pelvic sidewall lymphadenopathy  is seen. Reproductive: The bladder is mildly distended and grossly unremarkable. The prostate remains normal in size. Minimal calcification is seen within the prostate. Other:  A small umbilical hernia is noted, containing only fat. Musculoskeletal: No acute osseous abnormalities are identified. Multilevel vacuum phenomenon is noted along the lumbar spine, with a likely chronic air-containing disc extrusion on the left side at L2-L3. The visualized musculature is unremarkable in appearance. Review of the MIP images confirms the above findings. IMPRESSION: 1. No evidence of aortic dissection. No evidence of aneurysmal dilatation. 2. No evidence of significant pulmonary embolus. 3. Scattered calcific atherosclerotic disease along the abdominal aorta and its branches, with likely mild to moderate luminal narrowing along the external and internal iliac arteries bilaterally. 4. **An incidental finding of potential clinical significance has been found. 1.9 cm hypodense nodule at the right thyroid lobe. Consider further evaluation with thyroid ultrasound. If patient is clinically hyperthyroid, consider nuclear medicine thyroid uptake and scan.** 5. Scattered coronary artery calcifications seen. 6. Minimal emphysema at the lung apices.  Lungs otherwise clear. 7. Small umbilical hernia, containing only fat. 8. Mild degenerative change along the lumbar spine. Electronically Signed  By: Roanna Raider M.D.   On: 02/17/2016 17:34    Procedures Procedures (including critical care time)  Medications Ordered in ED Medications  iopamidol (ISOVUE-370) 76 % injection 100 mL (100 mLs Intravenous Contrast Given 02/17/16 1700)     Initial Impression / Assessment and Plan / ED Course  I have reviewed the triage vital signs and the nursing notes.  Pertinent labs & imaging results that were available during my care of the patient were reviewed by me and considered in my medical decision making (see chart for details).      EKG troponin normal. CT angiogram of the chest abdomen show no acute disease. Patient no longer chest pain. Patient is referred back to his cardiologist and will follow-up this week  Final Clinical Impressions(s) / ED Diagnoses   Final diagnoses:  Atypical chest pain    New Prescriptions New Prescriptions   No medications on file     Bethann Berkshire, MD 02/17/16 2023

## 2016-02-17 NOTE — ED Notes (Signed)
Patient transported to X-ray 

## 2016-02-17 NOTE — Discharge Instructions (Signed)
Follow up with Dr. Jacinto HalimGanji next week.  Return if problems before you see him

## 2016-02-21 DIAGNOSIS — R202 Paresthesia of skin: Secondary | ICD-10-CM | POA: Diagnosis not present

## 2016-02-21 DIAGNOSIS — R001 Bradycardia, unspecified: Secondary | ICD-10-CM | POA: Diagnosis not present

## 2016-02-21 DIAGNOSIS — I739 Peripheral vascular disease, unspecified: Secondary | ICD-10-CM | POA: Diagnosis not present

## 2016-02-21 DIAGNOSIS — I10 Essential (primary) hypertension: Secondary | ICD-10-CM | POA: Diagnosis not present

## 2016-04-04 DIAGNOSIS — I739 Peripheral vascular disease, unspecified: Secondary | ICD-10-CM | POA: Diagnosis not present

## 2016-04-17 DIAGNOSIS — R202 Paresthesia of skin: Secondary | ICD-10-CM | POA: Diagnosis not present

## 2016-04-17 DIAGNOSIS — I10 Essential (primary) hypertension: Secondary | ICD-10-CM | POA: Diagnosis not present

## 2016-04-17 DIAGNOSIS — I739 Peripheral vascular disease, unspecified: Secondary | ICD-10-CM | POA: Diagnosis not present

## 2016-04-17 DIAGNOSIS — R001 Bradycardia, unspecified: Secondary | ICD-10-CM | POA: Diagnosis not present

## 2016-05-16 ENCOUNTER — Ambulatory Visit (INDEPENDENT_AMBULATORY_CARE_PROVIDER_SITE_OTHER): Payer: Medicare HMO | Admitting: Internal Medicine

## 2016-05-16 ENCOUNTER — Encounter: Payer: Self-pay | Admitting: Internal Medicine

## 2016-05-16 DIAGNOSIS — E041 Nontoxic single thyroid nodule: Secondary | ICD-10-CM | POA: Diagnosis not present

## 2016-05-16 NOTE — Progress Notes (Signed)
[name], male   DOB: 1945-05-20, 71 y.o.   MRN: [id number]    HPI  Timothy Webster is a 71 y.o.-year-old male, referred by Dr. Jacinto HalimGanji, for evaluation for a R thyroid nodule.  The right thyroid nodule was observed incidentally on a CT scan of his chest on 02/17/2016 (after he presented at [facility] for chest pain). The report mentioned a 1.9 cm hypodense nodule. He did not have a thyroid ultrasound yet.    Pt denies: - feeling nodules in neck - hoarseness - dysphagia - choking - SOB with lying down  No recent TFTs available for review.  Pt denies: - fatigue - heat intolerance/cold intolerance - tremors - palpitations - anxiety/depression - hyperdefecation/constipation - dry skin  But he does c/o: - weight gain  - after quitting smoking in 01/2016 - hair loss  No FH of thyroid ds. No FH of thyroid cancer. No h/o radiation tx to head or neck.  No seaweed or kelp. + recent contrast studies - last 02/2016. No steroid use. No herbal supplements. He is on B vitamins - last dose this am.  Pt also has a history of HTN, HL, PAD, sinus bradycardia.  ROS: Constitutional: + see HPI Eyes: no blurry vision, no xerophthalmia ENT: no sore throat, + see HPI Cardiovascular: no CP/SOB/palpitations/leg swelling Respiratory: no cough/SOB Gastrointestinal: no N/V/D/C Musculoskeletal: + muscle/+ joint aches Skin: no rashes, + hair loss Neurological: no tremors/numbness/tingling/dizziness, + HA (allergies) - started allergy med Psychiatric: no depression/anxiety  Past Medical History:  Diagnosis Date  . Hyperlipidemia   . Hypertension   . PAD (peripheral artery disease) (HCC)    Past Surgical History:  Procedure Laterality Date  . INSERTION OF ILIAC STENT  03/15/2014   Procedure: INSERTION OF ILIAC STENT;  Surgeon: Yates DecampJay [name], MD;  [facility];  Service: Cardiovascular;;  LEIA   . LOWER EXTREMITY ANGIOGRAM N/A 03/15/2014   Procedure: LOWER EXTREMITY  ANGIOGRAM;  Surgeon: Yates DecampJay [name], MD;  [facility];  Service: Cardiovascular;  Laterality: N/A;  . LOWER EXTREMITY ANGIOGRAM Right 04/26/2014   Procedure: LOWER EXTREMITY ANGIOGRAM;  Surgeon: Yates DecampJay [name], MD;  [facility];  Service: Cardiovascular;  Laterality: Right;  . PERIPHERAL VASCULAR CATHETERIZATION Bilateral 05/16/2015   Procedure: Lower Extremity Angiography;  Surgeon: Yates DecampJay [name], MD;  [facility];  Service: Cardiovascular;  Laterality: Bilateral;  . PERIPHERAL VASCULAR CATHETERIZATION Right 05/16/2015   Procedure: Peripheral Vascular Atherectomy;  Surgeon: Yates DecampJay [name], MD;  [facility];  Service: Cardiovascular;  Laterality: Right;  sfa   Social History   Social History  . Marital status: Married    Spouse name: N/A  . Number of children: 1    Occupational History  . retired   Social History Main Topics  . Smoking status: Former Smoker    Years: 15.00    Quit date: 01/2016  . Smokeless tobacco: Never Used  . Alcohol use No  . Drug use: No   Current Outpatient Prescriptions on File Prior to Visit  Medication Sig Dispense Refill  . acetaminophen (TYLENOL) 500 MG tablet Take 1,000 mg by mouth every 6 (six) hours as needed (pain).    Marland Kitchen. amLODipine-benazepril (LOTREL) 10-40 MG per capsule Take 1 capsule by mouth daily.    Marland Kitchen. aspirin EC 81 MG tablet Take 81 mg by mouth daily.    Marland Kitchen. b complex vitamins tablet Take 1 tablet by mouth daily.    .Marland Kitchen  gabapentin (NEURONTIN) 300 MG capsule Take 1 capsule by mouth 2 (two) times daily.    . pravastatin (PRAVACHOL) 40 MG tablet Take 40 mg by mouth at bedtime.     . cilostazol (PLETAL) 100 MG tablet Take 100 mg by mouth 2 (two) times daily.     No current facility-administered medications on file prior to visit.    No Known Allergies   Family History  Problem Relation Age of Onset  . Hypertension Mother   . Hyperlipidemia Mother   . Heart disease Mother     PE: BP 122/72 (BP Location: Left  Arm, Patient Position: Sitting)   Pulse 62   Ht 5\' 6"  (1.676 m)   Wt 141 lb (64 kg)   SpO2 98%   BMI 22.76 kg/m  Wt Readings from Last 3 Encounters:  05/16/16 141 lb (64 kg)  02/17/16 125 lb (56.7 kg)  04/26/14 132 lb (59.9 kg)   Constitutional: normal weight, in NAD Eyes: PERRLA, EOMI, no exophthalmos ENT: moist mucous membranes, no thyromegaly - but R thyroid lobe fuller, no cervical lymphadenopathy Cardiovascular: RRR, No MRG Respiratory: CTA B Gastrointestinal: abdomen soft, NT, ND, BS+ Musculoskeletal: no deformities, strength intact in all 4;  Skin: moist, warm, no rashes Neurological: no tremor with outstretched hands, DTR normal in all 4  ASSESSMENT: 1. R thyroid nodule  PLAN: 1. R thyroid nodule - I reviewed the images of his chest CT >> thyroid nodule is large and hypoechoic. However, I discussed with the patient and his wife that the CT scan is not the optimum test to evaluate thyroid nodules. He will need a thyroid ultrasound which is better at characterizing the nodule. We did discuss about ultrasound features that would increase the risk of malignancy: Hypo-echogenicity, microcalcifications, internal blood flow, more tall than wide, invaginations in surrounding tissue.  - However, we need to start by checking his thyroid tests, since, if TSH is low, the indicated first test is an uptake and scan, rather than a thyroid ultrasound. I explained that if the thyroid nodule is overproducing hormones, the risk of cancer is very low, and no biopsy is necessary. If the thyroid tests are normal, will proceed with the thyroid ultrasound and, depending on the results, he may need a biopsy of this nodule. - We discussed about possible results of the biopsy: benign, indeterminant, malignant and we also discussed about the plan in each of these situations.  - I explained that thyroid cancer is usually slow-growing,  and not as aggressive as other cancers. However, he will need to have  his thyroid out in this case  - Patient and his wife agreed to plan  - He will need to come back for labs in 5 days, after he stops his B vitamins (biotin will interfere with the TFTs if recheck them today)  - I'll see him back in a year,  but this plan may change depending on the results of the TFTs, ultrasound, biopsy  -  he does not have a computer, so will need to be called with results   Component     Latest Ref Rng & Units 05/23/2016  T4,Free(Direct)     0.60 - 1.60 ng/dL 1.91  Triiodothyronine,Free,Serum     2.3 - 4.2 pg/mL 3.2  TSH     0.35 - 4.50 uIU/mL 1.47   Will go ahead with checking the thyroid ultrasound.  I will addend the results when they become available.  Carlus Pavlov, MD PhD [facility] Endocr[facility]

## 2016-05-16 NOTE — Patient Instructions (Addendum)
Please stop the B vitamins for the next 5 days and come back for labs in 5 days.  Depending on the results of the labs, we may need to just follow this thyroid nodule, or obtain an imaging test. I will let you know about further plan, as soon as the results are back.  Please return in 1 year.   Thyroid Nodule A thyroid nodule is an isolatedgrowth of thyroid cells that forms a lump in your thyroid gland. The thyroid gland is a butterfly-shaped gland. It is found in the lower front of your neck. This gland sends chemical messengers (hormones) through your blood to all parts of your body. These hormones are important in regulating your body temperature and helping your body to use energy. Thyroid nodules are common. Most are not cancerous (are benign). You may have one nodule or several nodules. Different types of thyroid nodules include:  Nodules that grow and fill with fluid (thyroid cysts).  Nodules that produce too much thyroid hormone (hot nodules or hyperthyroid).  Nodules that produce no thyroid hormone (cold nodules or hypothyroid).  Nodules that form from cancer cells (thyroid cancers). What are the causes? Usually, the cause of this condition is not known. What increases the risk? Factors that make this condition more likely to develop include:  Increasing age. Thyroid nodules become more common in people who are older than 72 years of age.  Gender.  Benign thyroid nodules are more common in women.  Cancerous (malignant) thyroid nodules are more common in men.  A family history that includes:  Thyroid nodules.  Pheochromocytoma.  Thyroid carcinoma.  Hyperparathyroidism.  Certain kinds of thyroid diseases, such as Hashimoto thyroiditis.  Lack of iodine.  A history of head and neck radiation, such as from X-rays. What are the signs or symptoms? It is common for this condition to cause no symptoms. If you have symptoms, they may include:  A lump in your lower  neck.  Feeling a lump or tickle in your throat.  Pain in your neck, jaw, or ear.  Having trouble swallowing. Hot nodules may cause symptoms that include:  Weight loss.  Warm, flushed skin.  Feeling hot.  Feeling nervous.  A racing heartbeat. Cold nodules may cause symptoms that include:  Weight gain.  Dry skin.  Brittle hair. This may also occur with hair loss.  Feeling cold.  Fatigue. Thyroid cancer nodules may cause symptoms that include:  Hard nodules that feel stuck to the thyroid gland.  Hoarseness.  Lumps in the glands near your thyroid (lymph nodes). How is this diagnosed? A thyroid nodule may be felt by your health care provider during a physical exam. This condition may also be diagnosed based on your symptoms. You may also have tests, including:  An ultrasound. This may be done to confirm the diagnosis.  A biopsy. This involves taking a sample from the nodule and looking at it under a microscope to see if the nodule is benign.  Blood tests to make sure that your thyroid is working properly.  Imaging tests such as MRI or CT scan may be done if:  Your nodule is large.  Your nodule is blocking your airway.  Cancer is suspected. How is this treated? Treatment depends on the cause and size of your nodule or nodules. If the nodule is benign, treatment may not be necessary. Your health care provider may monitor the nodule to see if it goes away without treatment. If the nodule continues to grow, is cancerous,  or does not go away:  It may need to be drained with a needle.  It may need to be removed with surgery. If you have surgery, part or all of your thyroid gland may need to be removed as well. Follow these instructions at home:  Pay attention to any changes in your nodule.  Take over-the-counter and prescription medicines only as told by your health care provider.  Keep all follow-up visits as told by your health care provider. This is  important. Contact a health care provider if:  Your voice changes.  You have trouble swallowing.  You have pain in your neck, ear, or jaw that is getting worse.  Your nodule gets bigger.  Your nodule starts to make it harder for you to breathe. Get help right away if:  You have a sudden fever.  You feel very weak.  Your muscles look like they are shrinking (muscle wasting).  You have mood swings.  You feel very restless.  You feel confused.  You are seeing or hearing things that other people do not see or hear (having hallucinations).  You feel suddenly nauseous or throw up.  You suddenly have diarrhea.  You have chest pain.  There is a loss of consciousness. This information is not intended to replace advice given to you by your health care provider. Make sure you discuss any questions you have with your health care provider. Document Released: 11/17/2003 Document Revised: 08/27/2015 Document Reviewed: 04/06/2014 Elsevier Interactive Patient Education  2017 [employer].

## 2016-05-23 ENCOUNTER — Telehealth: Payer: Self-pay

## 2016-05-23 ENCOUNTER — Other Ambulatory Visit (INDEPENDENT_AMBULATORY_CARE_PROVIDER_SITE_OTHER): Payer: Medicare HMO

## 2016-05-23 DIAGNOSIS — E041 Nontoxic single thyroid nodule: Secondary | ICD-10-CM

## 2016-05-23 LAB — T4, FREE: FREE T4: 0.88 ng/dL (ref 0.60–1.60)

## 2016-05-23 LAB — T3, FREE: T3, Free: 3.2 pg/mL (ref 2.3–4.2)

## 2016-05-23 LAB — TSH: TSH: 1.47 u[IU]/mL (ref 0.35–4.50)

## 2016-05-23 NOTE — Telephone Encounter (Signed)
-----   Message from Carlus Pavlovristina Gherghe, MD sent at 05/23/2016  1:30 PM EDT ----- Raynelle FanningJulie, can you please call pt:  TFts are all normal, therefore [place] o[place]

## 2016-05-23 NOTE — Telephone Encounter (Signed)
Called and LVM advising patient of lab results, and the referral to GI for ultrasound. Gave call back number if any questions.

## 2016-07-05 DIAGNOSIS — H43811 Vitreous degeneration, right eye: Secondary | ICD-10-CM | POA: Diagnosis not present

## 2016-07-05 DIAGNOSIS — H401113 Primary open-angle glaucoma, right eye, severe stage: Secondary | ICD-10-CM | POA: Diagnosis not present

## 2016-07-09 DIAGNOSIS — H5211 Myopia, right eye: Secondary | ICD-10-CM | POA: Diagnosis not present

## 2016-07-15 DIAGNOSIS — H5410 Blindness, one eye, low vision other eye, unspecified eyes: Secondary | ICD-10-CM | POA: Diagnosis not present

## 2016-07-15 DIAGNOSIS — E785 Hyperlipidemia, unspecified: Secondary | ICD-10-CM | POA: Diagnosis not present

## 2016-07-15 DIAGNOSIS — I10 Essential (primary) hypertension: Secondary | ICD-10-CM | POA: Diagnosis not present

## 2016-07-26 DIAGNOSIS — H401113 Primary open-angle glaucoma, right eye, severe stage: Secondary | ICD-10-CM | POA: Diagnosis not present

## 2016-07-30 DIAGNOSIS — I739 Peripheral vascular disease, unspecified: Secondary | ICD-10-CM | POA: Diagnosis not present

## 2016-07-30 DIAGNOSIS — R001 Bradycardia, unspecified: Secondary | ICD-10-CM | POA: Diagnosis not present

## 2016-07-30 DIAGNOSIS — R202 Paresthesia of skin: Secondary | ICD-10-CM | POA: Diagnosis not present

## 2016-07-30 DIAGNOSIS — I10 Essential (primary) hypertension: Secondary | ICD-10-CM | POA: Diagnosis not present

## 2016-08-13 DIAGNOSIS — H401113 Primary open-angle glaucoma, right eye, severe stage: Secondary | ICD-10-CM | POA: Diagnosis not present

## 2016-10-08 DIAGNOSIS — I10 Essential (primary) hypertension: Secondary | ICD-10-CM | POA: Diagnosis not present

## 2016-10-08 DIAGNOSIS — I70203 Unspecified atherosclerosis of native arteries of extremities, bilateral legs: Secondary | ICD-10-CM | POA: Diagnosis not present

## 2016-10-08 DIAGNOSIS — E785 Hyperlipidemia, unspecified: Secondary | ICD-10-CM | POA: Diagnosis not present

## 2016-11-13 DIAGNOSIS — I739 Peripheral vascular disease, unspecified: Secondary | ICD-10-CM | POA: Diagnosis not present

## 2016-11-19 DIAGNOSIS — H401113 Primary open-angle glaucoma, right eye, severe stage: Secondary | ICD-10-CM | POA: Diagnosis not present

## 2016-11-26 DIAGNOSIS — H401113 Primary open-angle glaucoma, right eye, severe stage: Secondary | ICD-10-CM | POA: Diagnosis not present

## 2016-12-26 DIAGNOSIS — I739 Peripheral vascular disease, unspecified: Secondary | ICD-10-CM | POA: Diagnosis not present

## 2016-12-26 DIAGNOSIS — R202 Paresthesia of skin: Secondary | ICD-10-CM | POA: Diagnosis not present

## 2016-12-26 DIAGNOSIS — E7800 Pure hypercholesterolemia, unspecified: Secondary | ICD-10-CM | POA: Diagnosis not present

## 2016-12-26 DIAGNOSIS — I10 Essential (primary) hypertension: Secondary | ICD-10-CM | POA: Diagnosis not present

## 2017-01-01 DIAGNOSIS — E785 Hyperlipidemia, unspecified: Secondary | ICD-10-CM | POA: Diagnosis not present

## 2017-01-01 DIAGNOSIS — I10 Essential (primary) hypertension: Secondary | ICD-10-CM | POA: Diagnosis not present

## 2017-01-01 DIAGNOSIS — I70203 Unspecified atherosclerosis of native arteries of extremities, bilateral legs: Secondary | ICD-10-CM | POA: Diagnosis not present

## 2017-01-21 DIAGNOSIS — E7800 Pure hypercholesterolemia, unspecified: Secondary | ICD-10-CM | POA: Diagnosis not present

## 2017-01-21 DIAGNOSIS — I739 Peripheral vascular disease, unspecified: Secondary | ICD-10-CM | POA: Diagnosis not present

## 2017-01-26 NOTE — H&P (Signed)
OFFICE VISIT NOTES COPIED TO EPIC FOR DOCUMENTATION  . History of Present Illness (April Harrington; 12/27/16 11:13 AM) Patient words: Last OV 07/30/2016; f/u for abi.  The patient is a 72 year old male who presents for a Follow-up for Peripheral vascular disease. He has PAD, hyperlipidemia, tobacco use disorder which he has quit after second peripheral intervention in May 2017. He has history of left iliac artery stenting and left SFA and proximal popliteal and tibioperoneal trunk atherectomy on 03/25/2014 and right SFA intervention on 05/16/15 with atherectomy followed by [facility] angioplasty.  Recently he has noticed burning sensation in bilateral lower extremities. He also feels tightness in the legs with walking especially right leg. He reports he is able to push mow his yard for 20 mins and then stop to rest for 31mns [place] states that he has remained active. Has chronic mild dyspnea, but no chest pain or palpitation. No leg edema. He underwent repeat bilateral lower extremity duplex and now presents for follow up. No chest pain.   Problem List/Past Medical (April Harrington; 1December 21, 201811:13 AM) Glaucoma [06/2016]: Essential hypertension, benign (I10)  Hypercholesteremia (E78.00)  Tobacco use disorder, severe, dependence (F17.200)  45 pack year history of smoking Chest x-ray 12/24/2013: COPD. There is no evidence of pneumonia, CHF, or other acute cardiopulmonary abnormality. PAD (peripheral artery disease) (I73.9)  Peripheral arteriogram 05/16/2015: Right mid SFA atherectomy followed by drug-coated balloon angioplasty. Atherectomy right TP trunk and peroneal artery performed 04/26/2014 widely patent. Angioplasty performed 03/15/2014 Left external iliac artery 9.0 x 60, 9.0 x 20 mm self-expanding stent. Left distal SFA and proximal popliteal and tibioperoneal trunk atherectomy with CSI 1.5 mm Crown site is widely patent. 2 vessel runoff in the form of peroneal and  PT. Labwork  Labs 02/17/2016: CBC normal, BMP normal, potassium 3.3, hepatic function normal. I-STAT troponin negative. 02/05/2016: Potassium 4.1, creatinine 1.0, BMP normal. Cholesterol 201, triglycerides 79, HDL 79, LDL 105. 09/08/2015: Total cholesterol 162, triglycerides 60, HDL 71, LDL 79 05/11/2015: Total cholesterol 182, triglycerides 57, HDL 79, LDL 92, creatinine 0.88, potassium 4.6, CBC normal, PT/INR normal 04/20/2014: Creatinine 0.77, CMP normal, HCT 12.7/HCT 38.4 with normocytic indices, total cholesterol 172, triglycerides 60, HDL 75, LDL 85, LDL particle # 1026, PT 13.2, INR 1.0 Right tennis elbow (M77.11)  Leg paresthesia (R20.2)  Sinus bradycardia by electrocardiogram (R00.1)  EKG 10/12/2015: Marked sinus bradycardia at rate of 44 bpm, normal axis. No evidence of ischemia otherwise normal EKG. No significant change from EKG 04/19/2015: Normal sinus rhythm at rate of 52 bpm, EKG 12/24/2013: Marked sinus bradycardia at the rate of 39 bpm. Thyroid nodule (E04.1)  CT angiogram chest 02/17/2016: No evidence of any dissection, no evidence of pulmonary embolus. Calcified atherosclerotic plaque in the abdominal aorta with mild to moderate luminal narrowing along the external and internal iliac arteries bilaterally. 1.9 cm hypodense nodule right thyroid lobe. Consider nuclear medicine thyroid uptake. Minimal emphysema lungs.  Allergies (April Harrington; 12018-12-2109:13 AM) No Known Drug Allergies [12/24/2013]:  Family History (April Harrington; 112/21/201811:13 AM) Mother  Deceased. at age 73 from cancer. Father  Deceased. father died when pt was a small child, does not know any information on him Siblings  2 brothers, younger  Social History (April Harrington; 1December 21, 201811:13 AM) Current tobacco use  Former smoker. Quit 01/2015 Non Drinker/No Alcohol Use  Marital status  Married. Number of Children  1. Living Situation  Lives with spouse.  Past Surgical History (April  Harrington; 112/21/1811:13 AM) None [  12/24/2013]:  Medication History (April Harrington; 12/26/2016 11:26 AM) Aspirin (81MG Tablet, 1 Oral daily) Active. Dorzolamide HCl-Timolol Mal (22.3-6.8MG/ML Solution, Ophthalmic two times daily) Active. Latanoprost (0.005% Solution, 1 drop in right eye Ophthalmic at bedtime) Active. Super B Complex (1 Oral daily) Active. Amlodipine Besy-Benazepril HCl (10-40MG Capsule, 1 (one) Capsul Capsule Capsu Oral every morning, Taken starting 05/25/2015) Active. Pravastatin Sodium (40MG Tablet, 1 Tablet Tablet Tablet Tablet Oral at bedtime, Taken starting 08/25/2015) Active. Gabapentin (300MG Capsule, 1 Oral two times daily) Active. Medications Reconciled (verbally with pt; list present)  Diagnostic Studies History (April Harrington; 12/26/2016 11:22 AM) ABI's [11/13/2016]: This exam reveals moderately decreased perfusion of the right lower extremity, noted at the dorsalis pedis and post tibial artery level (ABI) with RABI 0.53 with monophasic waveforms suggestive of proximal vessel stenosis. This exam reveals mildly decreased perfusion of the left lower extremity, LABI 0.80 noted at the post tibial artery level (ABI). Compared to prior studies, right ABI has decreased. Consider complete Lower extremity arterial duplex in view of knows PAD and angioplasty. Cath [05/16/2015]: Peripheral arteriogram: Right SFA mid to distal 95% restenosis, 1.5 mm burr and 5x150 mm DCB. Stenosis reduced to 0-%. Atherectomy performed 04/26/2014 in the right peroneal artery is widely patent. TP trunk atherectomy widely patent. 2 vessel runoff in the form of peroneal and posterior tibial artery. Angioplasty performed 03/15/2014 Left external iliac artery 9.0 x 60, 9.0 x 20 mm self-expanding stent. Left distal SFA and proximal popliteal and tibioperoneal trunk atherectomy with CSI 1.5 mm Crown site is widely patent. Left proximal SFA has a focal 80-90% stenosis. 2 vessel runoff in the  form of peroneal and PT. Treadmill stress test [01/24/2014]: Indications: Assessment of Chest Pain. Conclusions: Negative for ischemia. Normal exercise tolerence. The patient exercised according to the Bruce protocol, Total time recorded 7 Min. 37 sec. achieving a max heart rate of 153 which was 101% of MPHR for age and 8.8 METS of work. Baseline NIBP was 126/64. Peak NIBP was 126/64 MaxSysp was: 160 MaxDiasp was: 64. The baseline ECG showed NSR,Normal ECG. During exercise there was no ST-T changes of ischemia. Symptoms: MPHR (100%) achieved. Mild dyspnea. Arrhythmia: Occasional PVC, PAC. Continue primary prevention.    Review of Systems Laverda Page, MD; 12/29/2016 4:21 PM) General Present- Feeling well. Not Present- Fatigue, Fever and Night Sweats. Skin Not Present- Itching. HEENT Not Present- Headache. Respiratory Not Present- Difficulty Breathing. Cardiovascular Present- Calf Cramps (burning in calf occasionally, right worse) and Claudications (cramps and also right knee joint pain). Not Present- Chest Pain, Fainting, Orthopnea and Swelling of Extremities. Gastrointestinal Not Present- Abdominal Pain, Constipation, Diarrhea, Nausea and Vomiting. Musculoskeletal Not Present- Back Pain and Joint Swelling. Neurological Not Present- Headaches. Hematology Not Present- Blood Clots, Easy Bruising and Nose Bleed. All other systems negative  Vitals (April Harrington; 12/26/2016 11:27 AM) 12/26/2016 11:16 AM Weight: 141.06 lb Height: 66in Body Surface Area: 1.72 m Body Mass Index: 22.77 kg/m  Pulse: 48 (Regular)  P.OX: 98% (Room air) BP: 128/80 (Sitting, Left Arm, Standard)       Physical Exam Laverda Page, MD; 12/29/2016 4:21 PM) General Mental Status-Alert. General Appearance-Cooperative, Appears stated age, Not in acute distress. Orientation-Oriented X3. Build & Nutrition-Lean and Well built.  Head and Neck Thyroid Gland Characteristics - no  palpable nodules, no palpable enlargement.  Eye Cornea - Left-Opaque.  Chest and Lung Exam Inspection Shape - Barrel-shaped. Palpation Tender - No chest wall tenderness. Auscultation Breath sounds - Clear.  Cardiovascular Cardiovascular examination reveals -normal  heart sounds, regular rate and rhythm with no murmurs.  Abdomen Palpation/Percussion Normal exam - Non Tender and No hepatosplenomegaly. Auscultation Normal exam - Bowel sounds normal.  Peripheral Vascular Lower Extremity Inspection - Bilateral - Loss of hair, No Pigmentation, No Varicose veins. Palpation - Temperature - Left - Normal. Right - Cool. Edema - Bilateral - No edema. Femoral pulse - Bilateral - Normal(loud bruit). Popliteal pulse - Bilateral - 1+. Dorsalis pedis pulse - Left - 1+. Right - Feeble. Posterior tibial pulse - Bilateral - Absent. Carotid arteries - Bilateral-No Carotid bruit. Abdomen-No prominent abdominal aortic pulsation, No epigastric bruit.  Neurologic Motor-Grossly intact without any focal deficits.  Musculoskeletal Global Assessment Left Lower Extremity - normal range of motion without pain. Right Lower Extremity - normal range of motion without pain.    Assessment & Plan Laverda Page MD; 12/29/2016 4:22 PM) PAD (peripheral artery disease) (I73.9) Story: Peripheral arteriogram 05/16/2015: Right mid SFA atherectomy followed by drug-coated balloon angioplasty. Atherectomy right TP trunk and peroneal artery performed 04/26/2014 widely patent. Angioplasty performed 03/15/2014 Left external iliac artery 9.0 x 60, 9.0 x 20 mm self-expanding stent. Left distal SFA and proximal popliteal and tibioperoneal trunk atherectomy with CSI 1.5 mm Crown site is widely patent. 2 vessel runoff in the form of peroneal and PT. Impression: ABI 11/13/2016: This exam reveals moderately decreased perfusion of the right lower extremity, noted at the dorsalis pedis and post tibial artery level  (ABI) with RABI 0.53 with monophasic waveforms suggestive of proximal vessel stenosis. This exam reveals mildly decreased perfusion of the left lower extremity, LABI 0.80 noted at the post tibial artery level (ABI). Compared to prior studies, right ABI has decreased. Consider complete Lower extremity arterial duplex in view of knows PAD and angioplasty. Leg paresthesia (R20.2) Essential hypertension, benign (I10) Impression: EKG 02/21/2016: Sinus bradycardia at rate of 55 bpm, normal axis, no evidence of ischemia. Borderline criteria for LVH. Hypercholesteremia (E78.00) Current Plans Changed Pravastatin Sodium 80MG, 1 Tablet at bedtime, Mail Order #90, 90 days starting 12/26/2016, Ref. x3. LIPID PANEL ([id number]) Labwork Story: 01/22/2017: Cholesterol 171, triglycerides 79, HDL 55, LDL 100, CBC normal.  Creatinine 0.9, EGFR 76/88, sodium 145, potassium 5.2, BMP normal.  INR 1.0, prothrombin time 10.7. Labs 02/17/2016: CBC normal, BMP normal, potassium 3.3, hepatic function normal. I-STAT troponin negative.  02/05/2016: Potassium 4.1, creatinine 1.0, BMP normal. Cholesterol 201, triglycerides 79, HDL 79, LDL 105.  09/08/2015: Total cholesterol 162, triglycerides 60, HDL 71, LDL 79  05/11/2015: Total cholesterol 182, triglycerides 57, HDL 79, LDL 92, creatinine 0.88, potassium 4.6, CBC normal, PT/INR normal  04/20/2014: Creatinine 0.77, CMP normal, HCT 12.7/HCT 38.4 with normocytic indices, total cholesterol 172, triglycerides 60, HDL 75, LDL 85, LDL particle # 1026, PT 13.2, INR 1.0  Note:-  Recommendations:  Patient was seen by me 6 months ago and he had worsening symptoms of claudication involving his right leg. I did repeat ABI last month and this clearly reveals monophasic waveforms at the right ankle with moderately reduced ABI compared to mild decrease in ABI previously. Due to class III symptoms of claudication, I have recommended that we proceed with peripheral angiography and possible  angioplasty. His wife is present at the bedside, he wishes to proceed as he states that he is clearly reduced his physical activity recently and has been taking Tylenol frequently to reduce pain with activity. He has remained abstinent from tobacco use. Blood pressure remains well controlled, his risk factors and also well controlled. I will obtain lipid  profile testing with preoperative/preprocedural labs. I will increase the dose of pravastatin to 80 mg.  CC Dr. Iona Beard.    Signed by Laverda Page, MD (12/29/2016 4:24 PM)

## 2017-01-28 ENCOUNTER — Ambulatory Visit (HOSPITAL_COMMUNITY)
Admission: RE | Disposition: A | Discharge status: Home / Self Care | Payer: Self-pay | Source: Ambulatory Visit | Attending: Cardiology

## 2017-01-28 ENCOUNTER — Ambulatory Visit (HOSPITAL_COMMUNITY)
Admission: RE | Admit: 2017-01-28 | Discharge: 2017-01-28 | Disposition: A | Discharge status: Home / Self Care | Payer: Medicare HMO | Source: Ambulatory Visit | Attending: Cardiology | Admitting: Cardiology

## 2017-01-28 DIAGNOSIS — Z79899 Other long term (current) drug therapy: Secondary | ICD-10-CM | POA: Insufficient documentation

## 2017-01-28 DIAGNOSIS — I70213 Atherosclerosis of native arteries of extremities with intermittent claudication, bilateral legs: Secondary | ICD-10-CM | POA: Insufficient documentation

## 2017-01-28 DIAGNOSIS — Z95820 Peripheral vascular angioplasty status with implants and grafts: Secondary | ICD-10-CM | POA: Diagnosis not present

## 2017-01-28 DIAGNOSIS — I70211 Atherosclerosis of native arteries of extremities with intermittent claudication, right leg: Secondary | ICD-10-CM | POA: Diagnosis not present

## 2017-01-28 HISTORY — PX: LOWER EXTREMITY ANGIOGRAPHY: CATH118251

## 2017-01-28 SURGERY — LOWER EXTREMITY ANGIOGRAPHY
Anesthesia: LOCAL | Laterality: Right

## 2017-01-28 MED ORDER — SODIUM CHLORIDE 0.9% FLUSH: 3.0000 mL | INTRAVENOUS | Status: DC | PRN

## 2017-01-28 MED ORDER — HEPARIN (PORCINE) IN NACL 2-0.9 UNIT/ML-% IJ SOLN
INTRAMUSCULAR | Status: AC | PRN
  Administered 2017-01-28: 1000 mL

## 2017-01-28 MED ORDER — HEPARIN (PORCINE) IN NACL 2-0.9 UNIT/ML-% IJ SOLN
INTRAMUSCULAR | Status: AC
  Filled 2017-01-28: qty 1000

## 2017-01-28 MED ORDER — LABETALOL HCL 5 MG/ML IV SOLN: 10.0000 mg | INTRAVENOUS | Status: DC | PRN

## 2017-01-28 MED ORDER — SODIUM CHLORIDE 0.9 % IV SOLN: 250.0000 mL | INTRAVENOUS | Status: DC | PRN

## 2017-01-28 MED ORDER — LIDOCAINE HCL 1 % IJ SOLN
INTRAMUSCULAR | Status: AC
  Filled 2017-01-28: qty 20

## 2017-01-28 MED ORDER — HEPARIN SODIUM (PORCINE) 1000 UNIT/ML IJ SOLN
INTRAMUSCULAR | Status: DC | PRN
  Administered 2017-01-28: 8000 [IU] via INTRAVENOUS
  Administered 2017-01-28: 2000 [IU] via INTRAVENOUS

## 2017-01-28 MED ORDER — CLOPIDOGREL BISULFATE 75 MG PO TABS: 75.0000 mg | ORAL_TABLET | Freq: Every day | ORAL | Status: DC

## 2017-01-28 MED ORDER — NITROGLYCERIN 1 MG/10 ML FOR IR/CATH LAB
INTRA_ARTERIAL | Status: DC | PRN
  Administered 2017-01-28: 200 ug via INTRA_ARTERIAL
  Administered 2017-01-28: 400 ug via INTRA_ARTERIAL
  Administered 2017-01-28: 300 ug via INTRA_ARTERIAL

## 2017-01-28 MED ORDER — SODIUM CHLORIDE 0.9 % IV SOLN
INTRAVENOUS | Status: AC | PRN
Start: 2017-01-28 — End: 2017-01-28
  Administered 2017-01-28: 500 mL via INTRAVENOUS

## 2017-01-28 MED ORDER — MIDAZOLAM HCL 2 MG/2ML IJ SOLN
INTRAMUSCULAR | Status: DC | PRN
  Administered 2017-01-28: 1 mg via INTRAVENOUS

## 2017-01-28 MED ORDER — CLOPIDOGREL BISULFATE 300 MG PO TABS
ORAL_TABLET | ORAL | Status: AC
  Administered 2017-01-28: 300 mg via ORAL
  Filled 2017-01-28: qty 1

## 2017-01-28 MED ORDER — MIDAZOLAM HCL 2 MG/2ML IJ SOLN
INTRAMUSCULAR | Status: AC
  Filled 2017-01-28: qty 2

## 2017-01-28 MED ORDER — SODIUM CHLORIDE 0.9% FLUSH: 3.0000 mL | Freq: Two times a day (BID) | INTRAVENOUS | Status: DC

## 2017-01-28 MED ORDER — SODIUM CHLORIDE 0.9 % WEIGHT BASED INFUSION: 1.0000 mL/kg/h | INTRAVENOUS | Status: DC

## 2017-01-28 MED ORDER — VIPERSLIDE LUBRICANT OPTIME
TOPICAL | Status: DC | PRN
  Administered 2017-01-28: 11:00:00 via SURGICAL_CAVITY

## 2017-01-28 MED ORDER — NITROGLYCERIN IN D5W 200-5 MCG/ML-% IV SOLN
INTRAVENOUS | Status: AC
  Filled 2017-01-28: qty 250

## 2017-01-28 MED ORDER — SODIUM CHLORIDE 0.9 % IV BOLUS (SEPSIS)
500.0000 mL | Freq: Once | INTRAVENOUS | Status: AC
  Administered 2017-01-28: 500 mL via INTRAVENOUS

## 2017-01-28 MED ORDER — LIDOCAINE HCL (PF) 1 % IJ SOLN
INTRAMUSCULAR | Status: DC | PRN
  Administered 2017-01-28: 15 mL

## 2017-01-28 MED ORDER — CLOPIDOGREL BISULFATE 75 MG PO TABS: 75.0000 mg | ORAL_TABLET | Freq: Every day | ORAL | 1 refills | Status: AC

## 2017-01-28 MED ORDER — NITROGLYCERIN 1 MG/10 ML FOR IR/CATH LAB
INTRA_ARTERIAL | Status: AC
  Filled 2017-01-28: qty 10

## 2017-01-28 MED ORDER — FENTANYL CITRATE (PF) 100 MCG/2ML IJ SOLN
INTRAMUSCULAR | Status: DC | PRN
  Administered 2017-01-28: 25 ug via INTRAVENOUS

## 2017-01-28 MED ORDER — SODIUM CHLORIDE 0.9 % IV SOLN
INTRAVENOUS | Status: DC
  Administered 2017-01-28: 250 mL/h via INTRAVENOUS

## 2017-01-28 MED ORDER — IODIXANOL 320 MG/ML IV SOLN
INTRAVENOUS | Status: DC | PRN
  Administered 2017-01-28: 105 mL via INTRAVENOUS

## 2017-01-28 MED ORDER — HEPARIN SODIUM (PORCINE) 1000 UNIT/ML IJ SOLN
INTRAMUSCULAR | Status: AC
  Filled 2017-01-28: qty 1

## 2017-01-28 MED ORDER — CLOPIDOGREL BISULFATE 75 MG PO TABS
300.0000 mg | ORAL_TABLET | Freq: Once | ORAL | Status: AC
  Administered 2017-01-28: 300 mg via ORAL

## 2017-01-28 MED ORDER — VERAPAMIL HCL 2.5 MG/ML IV SOLN
INTRAVENOUS | Status: AC
  Filled 2017-01-28: qty 2

## 2017-01-28 MED ORDER — HYDRALAZINE HCL 20 MG/ML IJ SOLN: 5.0000 mg | INTRAMUSCULAR | Status: DC | PRN

## 2017-01-28 MED ORDER — FENTANYL CITRATE (PF) 100 MCG/2ML IJ SOLN
INTRAMUSCULAR | Status: AC
  Filled 2017-01-28: qty 2

## 2017-01-28 SURGICAL SUPPLY — 28 items
BALLN IN.PACT DCB 5X60 (BALLOONS) ×3
CATH OMNI FLUSH 5F 65CM (CATHETERS) ×3 IMPLANT
CATH SOFT-VU 4F 65 STRAIGHT (CATHETERS) ×2 IMPLANT
CATH SOFT-VU STRAIGHT 4F 65CM (CATHETERS) ×1
COVER PRB 48X5XTLSCP FOLD TPE (BAG) ×2 IMPLANT
COVER PROBE 5X48 (BAG) ×1
DCB IN.PACT 5X60 (BALLOONS) ×2 IMPLANT
DEVICE CLOSURE MYNXGRIP 6/7F (Vascular Products) ×3 IMPLANT
DEVICE EMBOSHIELD NAV6 4.0-7.0 (WIRE) ×3 IMPLANT
DIAMONDBACK SOLID OAS 2.0MM (CATHETERS) ×3
KIT ENCORE 26 ADVANTAGE (KITS) ×3 IMPLANT
KIT MICROINTRODUCER STIFF 5F (SHEATH) ×3 IMPLANT
KIT PV (KITS) ×3 IMPLANT
LUBRICANT VIPERSLIDE CORONARY (MISCELLANEOUS) ×3 IMPLANT
SHEATH FLEXOR ANSEL 1 7F 45CM (SHEATH) ×3 IMPLANT
SHEATH PINNACLE 5F 10CM (SHEATH) ×3 IMPLANT
SHEATH PINNACLE 7F 10CM (SHEATH) ×3 IMPLANT
STOPCOCK MORSE 400PSI 3WAY (MISCELLANEOUS) ×3 IMPLANT
SYRINGE MEDRAD AVANTA MACH 7 (SYRINGE) ×3 IMPLANT
SYSTEM DIMNDBCK SLD OAS 2.0MM (CATHETERS) ×2 IMPLANT
TAPE VIPERTRACK RADIOPAQ (MISCELLANEOUS) ×2 IMPLANT
TAPE VIPERTRACK RADIOPAQUE (MISCELLANEOUS) ×1
TRANSDUCER W/STOPCOCK (MISCELLANEOUS) ×3 IMPLANT
TRAY PV CATH (CUSTOM PROCEDURE TRAY) ×3 IMPLANT
TUBING CIL FLEX 10 FLL-RA (TUBING) ×3 IMPLANT
WIRE BENTSON .035X145CM (WIRE) ×3 IMPLANT
WIRE MICROINTRODUCER 60CM (WIRE) ×3 IMPLANT
WIRE VIPER ADVANCE .017X335CM (WIRE) ×3 IMPLANT

## 2017-01-28 NOTE — Discharge Instructions (Signed)
Femoral Site Care Refer to this sheet in the next few weeks. These instructions provide you with information about caring for yourself after your procedure. Your health care provider may also give you more specific instructions. Your treatment has been planned according to current medical practices, but problems sometimes occur. Call your health care provider if you have any problems or questions after your procedure. What can I expect after the procedure? After your procedure, it is typical to have the following:  Bruising at the site that usually fades within 1-2 weeks.  Blood collecting in the tissue (hematoma) that may be painful to the touch. It should usually decrease in size and tenderness within 1-2 weeks.  Follow these instructions at home:  Take medicines only as directed by your health care provider.  You may shower 24-48 hours after the procedure or as directed by your health care provider. Remove the bandage (dressing) and gently wash the site with plain soap and water. Pat the area dry with a clean towel. Do not rub the site, because this may cause bleeding.  Do not take baths, swim, or use a hot tub until your health care provider approves.  Check your insertion site every day for redness, swelling, or drainage.  Do not apply powder or lotion to the site.  Limit use of stairs to twice a day for the first 2-3 days or as directed by your health care provider.  Do not squat for the first 2-3 days or as directed by your health care provider.  Do not lift over 10 lb (4.5 kg) for 5 days after your procedure or as directed by your health care provider.  Ask your health care provider when it is okay to: ? Return to work or school. ? Resume usual physical activities or sports. ? Resume sexual activity.  Do not drive home if you are discharged the same day as the procedure. Have someone else drive you.  You may drive 24 hours after the procedure unless otherwise instructed by  your health care provider.  Do not operate machinery or power tools for 24 hours after the procedure or as directed by your health care provider.  If your procedure was done as an outpatient procedure, which means that you went home the same day as your procedure, a responsible adult should be with you for the first 24 hours after you arrive home.  Keep all follow-up visits as directed by your health care provider. This is important. Contact a health care provider if:  You have a fever.  You have chills.  You have increased bleeding from the site. Hold pressure on the site. Get help right away if:  You have unusual pain at the site.  You have redness, warmth, or swelling at the site.  You have drainage (other than a small amount of blood on the dressing) from the site.  The site is bleeding, and the bleeding does not stop after 30 minutes of holding steady pressure on the site.  Your leg or foot becomes pale, cool, tingly, or numb. This information is not intended to replace advice given to you by your health care provider. Make sure you discuss any questions you have with your health care provider. Document Released: 08/27/2013 Document Revised: 06/01/2015 Document Reviewed: 07/13/2013 Elsevier Interactive Patient Education  2018 [employer].   Clopidogrel tablets What is this medicine? CLOPIDOGREL (kloh PID oh grel) helps to prevent blood clots. This medicine is used to prevent heart attack, stroke, or  other vascular events in people who are at high risk. This medicine may be used for other purposes; ask your health care provider or pharmacist if you have questions. COMMON BRAND NAME(S): Plavix What should I tell my health care provider before I take this medicine? They need to know if you have any of the following conditions: -bleeding disorders -bleeding in the brain -having surgery -history of stomach bleeding -an unusual or allergic reaction to clopidogrel, other  medicines, foods, dyes, or preservatives -pregnant or trying to get pregnant -breast-feeding How should I use this medicine? Take this medicine by mouth with a glass of water. Follow the directions on the prescription label. You may take this medicine with or without food. If it upsets your stomach, take it with food. Take your medicine at regular intervals. Do not take it more often than directed. Do not stop taking except on your doctor's advice. A special MedGuide will be given to you by the pharmacist with each prescription and refill. Be sure to read this information carefully each time. Talk to your pediatrician regarding the use of this medicine in children. Special care may be needed. Overdosage: If you think you have taken too much of this medicine contact a poison control center or emergency room at once. NOTE: This medicine is only for you. Do not share this medicine with others. What if I miss a dose? If you miss a dose, take it as soon as you can. If it is almost time for your next dose, take only that dose. Do not take double or extra doses. What may interact with this medicine? Do not take this medicine with the following medications: -dasabuvir; ombitasvir; paritaprevir; ritonavir -defibrotide This medicine may also interact with the following medications: -antiviral medicines for HIV or AIDS -aspirin -certain medicines for depression like citalopram, fluoxetine, fluvoxamine -certain medicines for fungal infections like ketoconazole, fluconazole, voriconazole -certain medicines for seizures like felbamate, oxcarbazepine, phenytoin -certain medicines for stomach problems like cimetidine, omeprazole, esomeprazole -certain medicines that treat or prevent blood clots like warfarin, enoxaparin, dalteparin, apixaban, dabigatran, rivaroxaban, ticlopidine -chloramphenicol -cilostazol -fluvastatin -isoniazid -modafinil -nicardipine -NSAIDS, medicines for pain and inflammation,  like ibuprofen or naproxen -quinine -repaglinide -tamoxifen -tolbutamide -topiramate -torsemide This list may not describe all possible interactions. Give your health care provider a list of all the medicines, herbs, non-prescription drugs, or dietary supplements you use. Also tell them if you smoke, drink alcohol, or use illegal drugs. Some items may interact with your medicine. What should I watch for while using this medicine? Visit your doctor or health care professional for regular check ups. Do not stop taking your medicine unless your doctor tells you to. Notify your doctor or health care professional and seek emergency treatment if you develop breathing problems; changes in vision; chest pain; severe, sudden headache; pain, swelling, warmth in the leg; trouble speaking; sudden numbness or weakness of the face, arm or leg. These can be signs that your condition has gotten worse. If you are going to have surgery or dental work, tell your doctor or health care professional that you are taking this medicine. Certain genetic factors may reduce the effect of this medicine. Your doctor may use genetic tests to determine treatment. What side effects may I notice from receiving this medicine? Side effects that you should report to your doctor or health care professional as soon as possible: -allergic reactions like skin rash, itching or hives, swelling of the face, lips, or tongue -signs and symptoms of bleeding such  as bloody or black, tarry stools; red or dark-brown urine; spitting up blood or brown material that looks like coffee grounds; red spots on the skin; unusual bruising or bleeding from the eye, gums, or nose -signs and symptoms of a blood clot such as breathing problems; changes in vision; chest pain; severe, sudden headache; pain, swelling, warmth in the leg; trouble speaking; sudden numbness or weakness of the face, arm or leg Side effects that usually do not require medical attention  (report to your doctor or health care professional if they continue or are bothersome): -constipation -diarrhea -headache -upset stomach This list may not describe all possible side effects. Call your doctor for medical advice about side effects. You may report side effects to FDA at 1-800-FDA-1088. Where should I keep my medicine? Keep out of the reach of children. Store at room temperature of 59 to 86 degrees F (15 to 30 degrees C). Throw away any unused medicine after the expiration date. NOTE: This sheet is a summary. It may not cover all possible information. If you have questions about this medicine, talk to your doctor, pharmacist, or health care provider.  2018 Elsevier/Gold Standard (2014-09-29 10:00:44)

## 2017-01-28 NOTE — Interval H&P Note (Signed)
History and Physical Interval Note:  01/28/2017 9:25 AM  Timothy HaleWillie L Besson  has presented today for surgery, with the diagnosis of PAD  The various methods of treatment have been discussed with the patient and family. After consideration of risks, benefits and other options for treatment, the patient has consented to  Procedure(s): LOWER EXTREMITY ANGIOGRAPHY (Bilateral) and possible angioplasty as a surgical intervention .  The patient's history has been reviewed, patient examined, no change in status, stable for surgery.  I have reviewed the patient's chart and labs.  Questions were answered to the patient's satisfaction.     Yates DecampJay Alie Moudy

## 2017-01-29 ENCOUNTER — Encounter (HOSPITAL_COMMUNITY): Payer: Self-pay | Admitting: Cardiology

## 2017-01-29 LAB — POCT ACTIVATED CLOTTING TIME
ACTIVATED CLOTTING TIME: 246 s
ACTIVATED CLOTTING TIME: 268 s

## 2017-01-29 MED FILL — Lidocaine HCl Local Inj 1%: INTRAMUSCULAR | Qty: 20 | Status: AC

## 2017-02-06 DIAGNOSIS — I70219 Atherosclerosis of native arteries of extremities with intermittent claudication, unspecified extremity: Secondary | ICD-10-CM | POA: Diagnosis not present

## 2017-02-06 DIAGNOSIS — R202 Paresthesia of skin: Secondary | ICD-10-CM | POA: Diagnosis not present

## 2017-02-06 DIAGNOSIS — E7800 Pure hypercholesterolemia, unspecified: Secondary | ICD-10-CM | POA: Diagnosis not present

## 2017-03-03 DIAGNOSIS — R202 Paresthesia of skin: Secondary | ICD-10-CM | POA: Diagnosis not present

## 2017-03-03 DIAGNOSIS — I10 Essential (primary) hypertension: Secondary | ICD-10-CM | POA: Diagnosis not present

## 2017-03-03 DIAGNOSIS — I70219 Atherosclerosis of native arteries of extremities with intermittent claudication, unspecified extremity: Secondary | ICD-10-CM | POA: Diagnosis not present

## 2017-03-03 DIAGNOSIS — I70203 Unspecified atherosclerosis of native arteries of extremities, bilateral legs: Secondary | ICD-10-CM | POA: Diagnosis not present

## 2017-03-03 DIAGNOSIS — E785 Hyperlipidemia, unspecified: Secondary | ICD-10-CM | POA: Diagnosis not present

## 2017-03-20 DIAGNOSIS — H401113 Primary open-angle glaucoma, right eye, severe stage: Secondary | ICD-10-CM | POA: Diagnosis not present

## 2017-04-03 DIAGNOSIS — H401113 Primary open-angle glaucoma, right eye, severe stage: Secondary | ICD-10-CM | POA: Diagnosis not present

## 2017-04-03 DIAGNOSIS — H401133 Primary open-angle glaucoma, bilateral, severe stage: Secondary | ICD-10-CM | POA: Diagnosis not present

## 2017-05-06 DIAGNOSIS — E7800 Pure hypercholesterolemia, unspecified: Secondary | ICD-10-CM | POA: Diagnosis not present

## 2017-05-07 DIAGNOSIS — I739 Peripheral vascular disease, unspecified: Secondary | ICD-10-CM | POA: Diagnosis not present

## 2017-05-14 DIAGNOSIS — I10 Essential (primary) hypertension: Secondary | ICD-10-CM | POA: Diagnosis not present

## 2017-05-14 DIAGNOSIS — I70219 Atherosclerosis of native arteries of extremities with intermittent claudication, unspecified extremity: Secondary | ICD-10-CM | POA: Diagnosis not present

## 2017-05-14 DIAGNOSIS — R202 Paresthesia of skin: Secondary | ICD-10-CM | POA: Diagnosis not present

## 2017-05-14 DIAGNOSIS — E7800 Pure hypercholesterolemia, unspecified: Secondary | ICD-10-CM | POA: Diagnosis not present

## 2017-05-16 ENCOUNTER — Ambulatory Visit: Payer: Medicare HMO | Admitting: Internal Medicine

## 2017-05-16 DIAGNOSIS — Z0289 Encounter for other administrative examinations: Secondary | ICD-10-CM

## 2017-06-04 DIAGNOSIS — I70203 Unspecified atherosclerosis of native arteries of extremities, bilateral legs: Secondary | ICD-10-CM | POA: Diagnosis not present

## 2017-06-04 DIAGNOSIS — E7800 Pure hypercholesterolemia, unspecified: Secondary | ICD-10-CM | POA: Diagnosis not present

## 2017-06-04 DIAGNOSIS — R202 Paresthesia of skin: Secondary | ICD-10-CM | POA: Diagnosis not present

## 2017-06-04 DIAGNOSIS — E785 Hyperlipidemia, unspecified: Secondary | ICD-10-CM | POA: Diagnosis not present

## 2017-06-04 DIAGNOSIS — I10 Essential (primary) hypertension: Secondary | ICD-10-CM | POA: Diagnosis not present

## 2017-06-04 DIAGNOSIS — I70219 Atherosclerosis of native arteries of extremities with intermittent claudication, unspecified extremity: Secondary | ICD-10-CM | POA: Diagnosis not present

## 2017-07-04 DIAGNOSIS — H401113 Primary open-angle glaucoma, right eye, severe stage: Secondary | ICD-10-CM | POA: Diagnosis not present

## 2017-08-14 DIAGNOSIS — I739 Peripheral vascular disease, unspecified: Secondary | ICD-10-CM | POA: Diagnosis not present

## 2017-09-01 DIAGNOSIS — Z6822 Body mass index (BMI) 22.0-22.9, adult: Secondary | ICD-10-CM | POA: Diagnosis not present

## 2017-09-01 DIAGNOSIS — I10 Essential (primary) hypertension: Secondary | ICD-10-CM | POA: Diagnosis not present

## 2017-09-01 DIAGNOSIS — R202 Paresthesia of skin: Secondary | ICD-10-CM | POA: Diagnosis not present

## 2017-09-01 DIAGNOSIS — E785 Hyperlipidemia, unspecified: Secondary | ICD-10-CM | POA: Diagnosis not present

## 2017-09-01 DIAGNOSIS — I739 Peripheral vascular disease, unspecified: Secondary | ICD-10-CM | POA: Diagnosis not present

## 2017-09-29 ENCOUNTER — Other Ambulatory Visit: Payer: Self-pay

## 2017-09-29 ENCOUNTER — Emergency Department (HOSPITAL_COMMUNITY)
Admission: EM | Admit: 2017-09-29 | Discharge: 2017-09-29 | Disposition: A | Discharge status: Home / Self Care | Payer: Medicare HMO | Attending: Emergency Medicine | Admitting: Emergency Medicine

## 2017-09-29 ENCOUNTER — Encounter (HOSPITAL_COMMUNITY): Payer: Self-pay | Admitting: Emergency Medicine

## 2017-09-29 DIAGNOSIS — E860 Dehydration: Secondary | ICD-10-CM

## 2017-09-29 DIAGNOSIS — Z7982 Long term (current) use of aspirin: Secondary | ICD-10-CM | POA: Insufficient documentation

## 2017-09-29 DIAGNOSIS — R001 Bradycardia, unspecified: Secondary | ICD-10-CM | POA: Diagnosis not present

## 2017-09-29 DIAGNOSIS — Z87891 Personal history of nicotine dependence: Secondary | ICD-10-CM | POA: Diagnosis not present

## 2017-09-29 DIAGNOSIS — Z79899 Other long term (current) drug therapy: Secondary | ICD-10-CM | POA: Diagnosis not present

## 2017-09-29 DIAGNOSIS — R0689 Other abnormalities of breathing: Secondary | ICD-10-CM | POA: Diagnosis not present

## 2017-09-29 DIAGNOSIS — Z7902 Long term (current) use of antithrombotics/antiplatelets: Secondary | ICD-10-CM | POA: Insufficient documentation

## 2017-09-29 DIAGNOSIS — I10 Essential (primary) hypertension: Secondary | ICD-10-CM | POA: Diagnosis not present

## 2017-09-29 DIAGNOSIS — I959 Hypotension, unspecified: Secondary | ICD-10-CM | POA: Diagnosis not present

## 2017-09-29 DIAGNOSIS — R42 Dizziness and giddiness: Secondary | ICD-10-CM | POA: Diagnosis not present

## 2017-09-29 LAB — CBC WITH DIFFERENTIAL/PLATELET
BASOS ABS: 0.1 10*3/uL (ref 0.0–0.1)
BASOS PCT: 0 %
EOS ABS: 0.2 10*3/uL (ref 0.0–0.7)
EOS PCT: 1 %
HEMATOCRIT: 35.4 % — AB (ref 39.0–52.0)
Hemoglobin: 11.5 g/dL — ABNORMAL LOW (ref 13.0–17.0)
Lymphocytes Relative: 11 %
Lymphs Abs: 1.6 10*3/uL (ref 0.7–4.0)
MCH: 31.1 pg (ref 26.0–34.0)
MCHC: 32.5 g/dL (ref 30.0–36.0)
MCV: 95.7 fL (ref 78.0–100.0)
MONO ABS: 0.9 10*3/uL (ref 0.1–1.0)
MONOS PCT: 6 %
NEUTROS ABS: 11.5 10*3/uL — AB (ref 1.7–7.7)
Neutrophils Relative %: 82 %
PLATELETS: 224 10*3/uL (ref 150–400)
RBC: 3.7 MIL/uL — ABNORMAL LOW (ref 4.22–5.81)
RDW: 14.6 % (ref 11.5–15.5)
WBC: 14.1 10*3/uL — ABNORMAL HIGH (ref 4.0–10.5)

## 2017-09-29 LAB — COMPREHENSIVE METABOLIC PANEL
ALBUMIN: 4 g/dL (ref 3.5–5.0)
ALT: 17 U/L (ref 0–44)
ANION GAP: 9 (ref 5–15)
AST: 22 U/L (ref 15–41)
Alkaline Phosphatase: 49 U/L (ref 38–126)
BILIRUBIN TOTAL: 0.7 mg/dL (ref 0.3–1.2)
BUN: 17 mg/dL (ref 8–23)
CALCIUM: 8.9 mg/dL (ref 8.9–10.3)
CO2: 20 mmol/L — ABNORMAL LOW (ref 22–32)
Chloride: 115 mmol/L — ABNORMAL HIGH (ref 98–111)
Creatinine, Ser: 1.88 mg/dL — ABNORMAL HIGH (ref 0.61–1.24)
GFR calc Af Amer: 39 mL/min — ABNORMAL LOW (ref >60)
GFR, EST NON AFRICAN AMERICAN: 34 mL/min — AB (ref >60)
Glucose, Bld: 108 mg/dL — ABNORMAL HIGH (ref 70–99)
POTASSIUM: 3.5 mmol/L (ref 3.5–5.1)
Sodium: 144 mmol/L (ref 135–145)
TOTAL PROTEIN: 7.1 g/dL (ref 6.5–8.1)

## 2017-09-29 LAB — I-STAT CHEM 8, ED
BUN: 16 mg/dL (ref 8–23)
CALCIUM ION: 1.21 mmol/L (ref 1.15–1.40)
CREATININE: 1.9 mg/dL — AB (ref 0.61–1.24)
Chloride: 115 mmol/L — ABNORMAL HIGH (ref 98–111)
GLUCOSE: 105 mg/dL — AB (ref 70–99)
HEMATOCRIT: 35 % — AB (ref 39.0–52.0)
HEMOGLOBIN: 11.9 g/dL — AB (ref 13.0–17.0)
Potassium: 3.6 mmol/L (ref 3.5–5.1)
Sodium: 143 mmol/L (ref 135–145)
TCO2: 18 mmol/L — AB (ref 22–32)

## 2017-09-29 LAB — TROPONIN I: Troponin I: <0.03 ng/mL (ref <0.03)

## 2017-09-29 MED ORDER — SODIUM CHLORIDE 0.9 % IV BOLUS (SEPSIS)
1000.0000 mL | Freq: Once | INTRAVENOUS | Status: AC
  Administered 2017-09-29: 1000 mL via INTRAVENOUS

## 2017-09-29 NOTE — ED Notes (Signed)
Pt ambulatory to waiting room. Pt verbalized understanding of discharge instructions.   

## 2017-09-29 NOTE — Discharge Instructions (Addendum)
Drink plenty of fluids.   Follow up with your md this week to recheck kidney function

## 2017-09-29 NOTE — ED Triage Notes (Signed)
Patient was brought by [facility] due to Hypotension. Patients pressure was 80/50 supine  with a heart rate of 60 beats per minute. 1300 fluid bolus given in route with last BP prior to arrival 91/51. Patient reports he was outside painting all day in the hot sun without fluids.

## 2017-09-30 NOTE — ED Provider Notes (Signed)
[facility] EMERGENCY DEPARTMENT Provider Note   CSN: [id number] Arrival date & time: 09/29/17  1845     History   Chief Complaint Chief Complaint  Patient presents with  . Hypotension    HPI Timothy Webster is a 72 y.o. male.  Patient states he was outside painting all day and then became weak and lightheaded and almost passed out.  Paramedics found him hypotensive.  Patient was given a liter and a half and his hypotension improved  The history is provided by the patient. No language interpreter was used.  Illness  This is a new problem. The current episode started 3 to 5 hours ago. The problem occurs constantly. The problem has been resolved. Pertinent negatives include no chest pain, no abdominal pain and no headaches. Nothing aggravates the symptoms. Nothing relieves the symptoms. Treatments tried: IV fluids. The treatment provided moderate relief.    Past Medical History:  Diagnosis Date  . Hyperlipidemia   . Hypertension   . PAD (peripheral artery disease) [facility](HCC)     Patient Active Problem List   Diagnosis Date Noted  . Right thyroid nodule 05/16/2016  . PVD (peripheral vascular disease) (HCC) 05/16/2015  . Peripheral artery disease (HCC) 04/26/2014  . Claudication in peripheral vascular disease (HCC) 03/14/2014    Past Surgical History:  Procedure Laterality Date  . INSERTION OF ILIAC STENT  03/15/2014   Procedure: INSERTION OF ILIAC STENT;  Surgeon: Yates DecampJay [name], MD;  [facility];  Service: Cardiovascular;;  LEIA   . LOWER EXTREMITY ANGIOGRAM N/A 03/15/2014   Procedure: LOWER EXTREMITY ANGIOGRAM;  Surgeon: Yates DecampJay [name], MD;  [facility];  Service: Cardiovascular;  Laterality: N/A;  . LOWER EXTREMITY ANGIOGRAM Right 04/26/2014   Procedure: LOWER EXTREMITY ANGIOGRAM;  Surgeon: Yates DecampJay [name], MD;  [facility];  Service: Cardiovascular;  Laterality: Right;  . LOWER EXTREMITY ANGIOGRAPHY Bilateral 01/28/2017   Procedure: LOWER EXTREMITY ANGIOGRAPHY;   Surgeon: Yates DecampGanji, Jay, MD;  Location: [facility] INVASIVE CV LAB;  Service: Cardiovascular;  Laterality: Bilateral;  . PERIPHERAL VASCULAR CATHETERIZATION Bilateral 05/16/2015   Procedure: Lower Extremity Angiography;  Surgeon: Yates DecampJay [name], MD;  [facility];  Service: Cardiovascular;  Laterality: Bilateral;  . PERIPHERAL VASCULAR CATHETERIZATION Right 05/16/2015   Procedure: Peripheral Vascular Atherectomy;  Surgeon: Yates DecampJay [name], MD;  [facility];  Service: Cardiovascular;  Laterality: Right;  sfa        Home Medications    Prior to Admission medications   Medication Sig Start Date End Date Taking? Authorizing Provider  acetaminophen (TYLENOL) 500 MG tablet Take 1,000 mg by mouth every 6 (six) hours as needed (pain).   Yes [provider]  amLODipine-benazepril (LOTREL) 10-40 MG per capsule Take 1 capsule by mouth daily.   Yes [provider]  aspirin EC 81 MG tablet Take 81 mg by mouth daily.   Yes [provider]  clopidogrel (PLAVIX) 75 MG tablet Take 75 mg by mouth daily.   Yes [provider]  dorzolamide-timolol (COSOPT) 22.3-6.8 MG/ML ophthalmic solution Place 1 drop into the right eye 2 (two) times daily.   Yes [provider]  ezetimibe (ZETIA) 10 MG tablet Take 10 mg by mouth every evening.   Yes [provider]  gabapentin (NEURONTIN) 300 MG capsule Take 600 mg by mouth 2 (two) times daily. MORNING & BEDTIME 02/05/16  Yes [provider]  latanoprost (XALATAN) 0.005 % ophthalmic solution Place 1 drop into the right eye at bedtime.  Yes [provider]  pravastatin (PRAVACHOL) 80 MG tablet Take 40 mg by mouth at bedtime.    Yes [provider]    Family History Family History  Problem Relation Age of Onset  . Hypertension Mother   . Hyperlipidemia Mother   . Heart disease Mother     Social History Social History   Tobacco Use  . Smoking status: Former Smoker    Years: 15.00     Last attempt to quit: 07/17/2015    Years since quitting: 2.2  . Smokeless tobacco: Never Used  Substance Use Topics  . Alcohol use: No  . Drug use: No     Allergies   Patient has no known allergies.   Review of Systems Review of Systems  Constitutional: Negative for appetite change and fatigue.  HENT: Negative for congestion, ear discharge and sinus pressure.   Eyes: Negative for discharge.  Respiratory: Negative for cough.   Cardiovascular: Negative for chest pain.  Gastrointestinal: Negative for abdominal pain and diarrhea.  Genitourinary: Negative for frequency and hematuria.  Musculoskeletal: Negative for back pain.  Skin: Negative for rash.  Neurological: Positive for dizziness. Negative for seizures and headaches.  Psychiatric/Behavioral: Negative for hallucinations.     Physical Exam Updated Vital Signs BP (!) 147/68   Pulse 67   Temp 97.9 F (36.6 C) (Oral)   Resp 16   Ht 5\' 2"  (1.575 m)   Wt 56.7 kg   SpO2 96%   BMI 22.86 kg/m   Physical Exam  Constitutional: He is oriented to person, place, and time. He appears well-developed.  HENT:  Head: Normocephalic.  Dry mucous membrane  Eyes: Conjunctivae and EOM are normal. No scleral icterus.  Neck: Neck supple. No thyromegaly present.  Cardiovascular: Normal rate and regular rhythm. Exam reveals no gallop and no friction rub.  No murmur heard. Pulmonary/Chest: No stridor. He has no wheezes. He has no rales. He exhibits no tenderness.  Abdominal: He exhibits no distension. There is no tenderness. There is no rebound.  Musculoskeletal: Normal range of motion. He exhibits no edema.  Lymphadenopathy:    He has no cervical adenopathy.  Neurological: He is oriented to person, place, and time. He exhibits normal muscle tone. Coordination normal.  Skin: No rash noted. No erythema.  Psychiatric: He has a normal mood and affect. His behavior is normal.     ED Treatments / Results  Labs (all labs ordered  are listed, but only abnormal results are displayed) Labs Reviewed  CBC WITH DIFFERENTIAL/PLATELET - Abnormal; Notable for the following components:      Result Value   WBC 14.1 (*)    RBC 3.70 (*)    Hemoglobin 11.5 (*)    HCT 35.4 (*)    Neutro Abs 11.5 (*)    All other components within normal limits  COMPREHENSIVE METABOLIC PANEL - Abnormal; Notable for the following components:   Chloride 115 (*)    CO2 20 (*)    Glucose, Bld 108 (*)    Creatinine, Ser 1.88 (*)    GFR calc non Af Amer 34 (*)    GFR calc Af Amer 39 (*)    All other components within normal limits  I-STAT CHEM 8, ED - Abnormal; Notable for the following components:   Chloride 115 (*)    Creatinine, Ser 1.90 (*)    Glucose, Bld 105 (*)    TCO2 18 (*)    Hemoglobin 11.9 (*)    HCT 35.0 (*)  All other components within normal limits  TROPONIN I    EKG None  Radiology No results found.  Procedures Procedures (including critical care time)  Medications Ordered in ED Medications  sodium chloride 0.9 % bolus 1,000 mL (0 mLs Intravenous Stopped 09/29/17 2110)     Initial Impression / Assessment and Plan / ED Course  I have reviewed the triage vital signs and the nursing notes.  Pertinent labs & imaging results that were available during my care of the patient were reviewed by me and considered in my medical decision making (see chart for details).     Patient with near syncope and dehydration.  Possible mild AKI.  Patient has been given IV fluids and is instructed to drink plenty of fluids at home and follow-up with his PCP in the next couple days to check on his kidney function  Final Clinical Impressions(s) / ED Diagnoses   Final diagnoses:  Dehydration    ED Discharge Orders    None       Bethann Berkshire, MD 09/30/17 [phone]

## 2017-10-21 DIAGNOSIS — H401113 Primary open-angle glaucoma, right eye, severe stage: Secondary | ICD-10-CM | POA: Diagnosis not present

## 2017-11-05 DIAGNOSIS — H401113 Primary open-angle glaucoma, right eye, severe stage: Secondary | ICD-10-CM | POA: Diagnosis not present

## 2017-11-05 DIAGNOSIS — Z01818 Encounter for other preprocedural examination: Secondary | ICD-10-CM | POA: Diagnosis not present

## 2017-11-05 DIAGNOSIS — H409 Unspecified glaucoma: Secondary | ICD-10-CM | POA: Diagnosis not present

## 2017-12-01 DIAGNOSIS — I10 Essential (primary) hypertension: Secondary | ICD-10-CM | POA: Diagnosis not present

## 2017-12-01 DIAGNOSIS — E785 Hyperlipidemia, unspecified: Secondary | ICD-10-CM | POA: Diagnosis not present

## 2017-12-01 DIAGNOSIS — I739 Peripheral vascular disease, unspecified: Secondary | ICD-10-CM | POA: Diagnosis not present

## 2017-12-01 DIAGNOSIS — R202 Paresthesia of skin: Secondary | ICD-10-CM | POA: Diagnosis not present

## 2017-12-10 DIAGNOSIS — N390 Urinary tract infection, site not specified: Secondary | ICD-10-CM | POA: Diagnosis not present

## 2018-01-31 ENCOUNTER — Other Ambulatory Visit: Payer: Self-pay | Admitting: Cardiology

## 2018-01-31 DIAGNOSIS — I739 Peripheral vascular disease, unspecified: Secondary | ICD-10-CM

## 2018-02-17 ENCOUNTER — Ambulatory Visit: Payer: Medicare Other

## 2018-02-17 ENCOUNTER — Other Ambulatory Visit: Payer: Self-pay

## 2018-02-17 DIAGNOSIS — I739 Peripheral vascular disease, unspecified: Secondary | ICD-10-CM | POA: Diagnosis not present

## 2018-02-17 MED ORDER — CLOPIDOGREL BISULFATE 75 MG PO TABS: 75.0000 mg | ORAL_TABLET | Freq: Every day | ORAL | 1 refills | Status: DC

## 2018-02-17 MED ORDER — CLOPIDOGREL BISULFATE 75 MG PO TABS: 75.0000 mg | ORAL_TABLET | Freq: Every day | ORAL | 0 refills | Status: DC

## 2018-02-20 ENCOUNTER — Other Ambulatory Visit: Payer: Self-pay | Admitting: Cardiology

## 2018-02-23 DIAGNOSIS — R51 Headache: Secondary | ICD-10-CM | POA: Diagnosis not present

## 2018-02-23 DIAGNOSIS — I798 Other disorders of arteries, arterioles and capillaries in diseases classified elsewhere: Secondary | ICD-10-CM | POA: Diagnosis not present

## 2018-02-23 DIAGNOSIS — I10 Essential (primary) hypertension: Secondary | ICD-10-CM | POA: Diagnosis not present

## 2018-02-25 ENCOUNTER — Encounter: Payer: Self-pay | Admitting: Cardiology

## 2018-02-25 DIAGNOSIS — I10 Essential (primary) hypertension: Secondary | ICD-10-CM | POA: Insufficient documentation

## 2018-02-25 DIAGNOSIS — R001 Bradycardia, unspecified: Secondary | ICD-10-CM

## 2018-02-25 DIAGNOSIS — E782 Mixed hyperlipidemia: Secondary | ICD-10-CM | POA: Insufficient documentation

## 2018-02-25 DIAGNOSIS — F17200 Nicotine dependence, unspecified, uncomplicated: Secondary | ICD-10-CM

## 2018-02-25 DIAGNOSIS — Z0189 Encounter for other specified special examinations: Secondary | ICD-10-CM | POA: Insufficient documentation

## 2018-02-25 DIAGNOSIS — H409 Unspecified glaucoma: Secondary | ICD-10-CM

## 2018-02-25 DIAGNOSIS — E785 Hyperlipidemia, unspecified: Secondary | ICD-10-CM | POA: Insufficient documentation

## 2018-02-25 DIAGNOSIS — M7711 Lateral epicondylitis, right elbow: Secondary | ICD-10-CM | POA: Insufficient documentation

## 2018-02-25 HISTORY — DX: Unspecified glaucoma: H40.9

## 2018-02-25 HISTORY — DX: Nicotine dependence, unspecified, uncomplicated: F17.200

## 2018-02-25 HISTORY — DX: Bradycardia, unspecified: R00.1

## 2018-02-25 NOTE — Progress Notes (Signed)
Subjective:  Primary Physician:  Iona Beard, MD  [name], male    DOB: 1945-04-15, 73 y.o.   MRN: [id number]  Chief Complaint  Patient presents with  . Hyperlipidemia    6 month F/U PAD  . Circulatory Problem    Chest pain   HPI: Timothy Webster  is a 73 y.o. male  with PAD, hyperlipidemia, tobacco use disorder which he has quit in May 2017. He has left iliac artery stenting (2016) and left SFA and tibioperoneal trunk angioplasty and also right SFA angioplasty in 2016 and 2017 respectively and repeat angioplasty to his right SFA in 2019 again with atherectomy followed by drug coated balloon angioplasty.  He now presents here for follow-up. He has remained abstinent from tobacco use. Chronic dyspnea is remained stable.  Over the past 3 months he has noticed exertional chest discomfort in the form of feeling hot in the front of the chest, feels cold [place] in the back, and is relieved when he sits down and take several minutes.  Essentially states that this comes on every time he exerts but he has been able to use the treadmill and exercise regularly without symptoms on the treadmill.  No leg edema.  No hemoptysis.  No recent change in weight.  Past Medical History:  Diagnosis Date  . Glaucoma 02/25/2018  . Hyperlipidemia   . Hypertension   . PAD (peripheral artery disease) ([place])   . Sinus bradycardia on ECG 02/25/2018   EKG 10/12/2015: Marked sinus bradycardia at rate of 44 bpm, normal axis. No evidence of ischemia otherwise normal EKG. No significant change from EKG 04/19/2015: Normal sinus rhythm at rate of 52 bpm, EKG 12/24/2013: Marked sinus bradycardia at the rate of 39 bpm.  . Tobacco use disorder, severe, dependence 02/25/2018    Past Surgical History:  Procedure Laterality Date  . INSERTION OF ILIAC STENT  03/15/2014   Procedure: INSERTION OF ILIAC STENT;  Surgeon: Adrian Prows, MD;  Location: [facility] CATH LAB;  Service: Cardiovascular;;  LEIA   . LOWER EXTREMITY  ANGIOGRAM N/A 03/15/2014   Procedure: LOWER EXTREMITY ANGIOGRAM;  Surgeon: Adrian Prows, MD;  Location[facility] CATH LAB;  Service: Cardiovascular;  Laterality: N/A;  . LOWER EXTREMITY ANGIOGRAM Right 04/26/2014   Procedure: LOWER EXTREMITY ANGIOGRAM;  Surgeon: Adrian Prows, MD;  Location: [facility] CATH LAB;  Service: Cardiovascular;  Laterality: Right;  . LOWER EXTREMITY ANGIOGRAPHY Bilateral 01/28/2017   Procedure: LOWER EXTREMITY ANGIOGRAPHY;  Surgeon: Adrian Prows, MD;  Location: [place] CV LAB;  Service: Cardiovascular;  Laterality: Bilateral;  . PERIPHERAL VASCULAR CATHETERIZATION Bilateral 05/16/2015   Procedure: Lower Extremity Angiography;  Surgeon: Adrian Prows, MD;  Location: [place] CV LAB;  Service: Cardiovascular;  Laterality: Bilateral;  . PERIPHERAL VASCULAR CATHETERIZATION Right 05/16/2015   Procedure: Peripheral Vascular Atherectomy;  Surgeon: Adrian Prows, MD;  Location: [place] CV LAB;  Service: Cardiovascular;  Laterality: Right;  sfa  . REFRACTIVE SURGERY     Right Eye    Social History   Socioeconomic History  . Marital status: Married    Spouse name: Not on file  . Number of children: 1  . Years of education: Not on file  . Highest education level: Not on file  Occupational History  . Not on file  Social Needs  . Financial resource strain: Not on file  . Food insecurity:    Worry: Not on file    Inability: Not on file  . Transportation needs:  Medical: Not on file    Non-medical: Not on file  Tobacco Use  . Smoking status: Former Smoker    Years: 15.00    Last attempt to quit: 07/17/2015    Years since quitting: 2.6  . Smokeless tobacco: Never Used  Substance and Sexual Activity  . Alcohol use: No  . Drug use: No  . Sexual activity: Not on file  Lifestyle  . Physical activity:    Days per week: Not on file    Minutes per session: Not on file  . Stress: Not on file  Relationships  . Social connections:    Talks on phone: Not on file    Gets together: Not on file      Attends religious service: Not on file    Active member of club or organization: Not on file    Attends meetings of clubs or organizations: Not on file    Relationship status: Not on file  . Intimate partner violence:    Fear of current or ex partner: Not on file    Emotionally abused: Not on file    Physically abused: Not on file    Forced sexual activity: Not on file  Other Topics Concern  . Not on file  Social History Narrative  . Not on file    Current Outpatient Medications on File Prior to Visit  Medication Sig Dispense Refill  . acetaminophen (TYLENOL) 500 MG tablet Take 1,000 mg by mouth every 6 (six) hours as needed (pain).    Marland Kitchen amLODipine-benazepril (LOTREL) 10-40 MG per capsule Take 1 capsule by mouth daily.    Marland Kitchen aspirin EC 81 MG tablet Take 81 mg by mouth daily.    . clopidogrel (PLAVIX) 75 MG tablet TAKE 1 TABLET BY MOUTH ONCE DAILY 90 tablet 0  . dorzolamide-timolol (COSOPT) 22.3-6.8 MG/ML ophthalmic solution Place 1 drop into the right eye 2 (two) times daily.    Marland Kitchen ezetimibe (ZETIA) 10 MG tablet Take 10 mg by mouth every evening.    . gabapentin (NEURONTIN) 300 MG capsule Take 600 mg by mouth 2 (two) times daily. MORNING & BEDTIME    . latanoprost (XALATAN) 0.005 % ophthalmic solution Place 1 drop into the right eye at bedtime.    . pravastatin (PRAVACHOL) 80 MG tablet Take 40 mg by mouth at bedtime.     . prednisoLONE acetate (PRED FORTE) 1 % ophthalmic suspension Place 1 drop into both eyes daily.    . timolol (TIMOPTIC) 0.5 % ophthalmic solution Place 1 drop into both eyes 2 (two) times daily.     No current facility-administered medications on file prior to visit.    Review of Systems  Constitutional: Negative for malaise/fatigue and weight loss.  Respiratory: Negative for cough, hemoptysis and shortness of breath.   Cardiovascular: Positive for chest pain. Negative for palpitations, claudication and leg swelling.  Gastrointestinal: Negative for abdominal  pain, blood in stool, constipation, heartburn and vomiting.  Genitourinary: Negative for dysuria.  Musculoskeletal: Negative for joint pain and myalgias.  Neurological: Positive for tingling (bilateral feet). Negative for dizziness, focal weakness and headaches.  Endo/Heme/Allergies: Does not bruise/bleed easily.  Psychiatric/Behavioral: Negative for depression. The patient is not nervous/anxious.   All other systems reviewed and are negative.     Objective:  Blood pressure (!) 133/91, pulse (!) 47, height 5' 2"  (1.575 m), weight 144 lb 9.6 oz (65.6 kg), SpO2 97 %. Body mass index is 26.45 kg/m.   Physical Exam  Constitutional: He appears well-developed.  No distress.  petite  HENT:  Head: Atraumatic.  Eyes: Conjunctivae are normal.  Neck: Neck supple. No JVD present. No thyromegaly present.  Cardiovascular: Normal rate, regular rhythm, normal heart sounds and intact distal pulses. Exam reveals no gallop.  No murmur heard. Pulses:      Carotid pulses are 2+ on the right side and 2+ on the left side.      Femoral pulses are 2+ on the right side with bruit and 2+ on the left side with bruit.      Popliteal pulses are 1+ on the right side and 1+ on the left side.       Dorsalis pedis pulses are 1+ on the right side and 0 on the left side.       Posterior tibial pulses are 2+ on the right side and 1+ on the left side.  Loud bifemoral bruit present. Loss of hair. Warm extremity.  Pulmonary/Chest: Effort normal. He has no wheezes. He has no rales.  Barrel shaped chest with decreased breath sounds bilateral.   Abdominal: Soft. Bowel sounds are normal.  Musculoskeletal: Normal range of motion.        General: No edema.  Neurological: He is alert.  Skin: Skin is warm and dry.  Psychiatric: He has a normal mood and affect.     CARDIAC STUDIES:   ABI's [05/07/2017]: ABI 05/07/2017: This exam reveals moderately decreased perfusion of the lower extremity, noted at the post tibial  artery level (Bilateral ABI 0.63). Severely abnormal waveforms at the ankles (monophasic). Compared to the study done on 02/70/2019, right ABI 0.90, left ABI 0.79.  Cath [05/16/2015]: Peripheral arteriogram: Right SFA mid to distal 95% restenosis, 1.5 mm burr and 5x150 mm DCB. Stenosis reduced to 0-%. Atherectomy performed 04/26/2014 in the right peroneal artery is widely patent. TP trunk atherectomy widely patent. 2 vessel runoff in the form of peroneal and posterior tibial artery. Angioplasty performed 03/15/2014 Left external iliac artery 9.0 x 60, 9.0 x 20 mm self-expanding stent. Left distal SFA and proximal popliteal and tibioperoneal trunk atherectomy with CSI 1.5 mm Crown site is widely patent. Left proximal SFA has a focal 80-90% stenosis. 2 vessel runoff in the form of peroneal and PT.  Treadmill stress test [01/24/2014]: Indications: Assessment of Chest Pain. Conclusions: Negative for ischemia. Normal exercise tolerence. The patient exercised according to the Bruce protocol, Total time recorded 7 Min. 37 sec. achieving a max heart rate of 153 which was 101% of MPHR for age and 8.8 METS of work. Baseline NIBP was 126/64. Peak NIBP was 126/64 MaxSysp was: 160 MaxDiasp was: 64. The baseline ECG showed NSR,Normal ECG. During exercise there was no ST-T changes of ischemia. Symptoms: MPHR (100%) achieved. Mild dyspnea. Arrhythmia: Occasional PVC, PAC. Continue primary prevention.  Angiography [03/15/2014]: Peripheral arteriogram 03/15/2014: Left external iliac artery 9.0 x 60, 9.0 x 20 mm self-expanding stent. Left distal SFA and proximal popliteal and tibioperoneal trunk.  Arteriogram [04/26/2014]: Peripheral arteriogram : Atherectomy with 1.5 mm CSI solid ground followed by balloon angioplasty of the right SFA CTO and right popliteal artery. AT occluded and T-P trunk 70-80% stenosis. Brisk two-vessel runoff with the diseased distal peroneal artery and normal dominant PT  Assessment &  Recommendations:   1. Atypical chest pain/Chest pain on exertion in the form of feeling hot.   2. Essential hypertension, benign EKG 02/26/18/20: Marked sinus bradycardia at rate of 42 bpm, left atrial enlargement, no evidence of ischemia, otherwise normal EKG. No significant change from EKG  05/14/2017: Marked sinus bradycardia at the rate of 48 bpm.  3. PVD (peripheral vascular disease) (HCC)  Story: Peripheral arteriogram 01/28/2017: Right prox to mid 90% calcific stenosis s/p CSI orbital atherectomy and 5x60 mm InPact Admiral DCB. Patent Right mid SFA atherectomy performed 05/16/2015 and Atherectomy right TP trunk and peroneal artery performed 04/26/2014. 2 Vessel R/O - PA & PT.  Angioplasty performed 03/15/2014 Left external iliac artery 9.0 x 60, 9.0 x 20 mm self-expanding stent. Left distal SFA and proximal popliteal and tibioperoneal trunk atherectomy with CSI 1.5 mm Crown site is widely patent. 2 vessel runoff in the form of peroneal and PT.   4. H/O Tobacco use disorder, has remained abstinent.  5. Sinus bradycardia on ECG asymptomatic  6. Hyperlipidemia  7. Laboratory examination  05/06/2017: Cholesterol 161, triglycerides 83, HDL 52, LDL 92.  01/22/2017: Cholesterol 171, triglycerides 79, HDL 55, LDL 100, CBC normal. Creatinine 0.9, EGFR 76/88, sodium 145, potassium 5.2, BMP normal. INR 1.0, prothrombin time 10.7.  Labs 02/17/2016: CBC normal, BMP normal, potassium 3.3, hepatic function normal. I-STAT troponin negative.  02/05/2016: Potassium 4.1, creatinine 1.0, BMP normal. Cholesterol 201, triglycerides 79, HDL 79, LDL 105.  09/08/2015: Total cholesterol 162, triglycerides 60, HDL 71, LDL 79  05/11/2015: Total cholesterol 182, triglycerides 57, HDL 79, LDL 92, creatinine 0.88, potassium 4.6, CBC normal, PT/INR normal  04/20/2014: Creatinine 0.77, CMP normal, HCT 12.7/HCT 38.4 with normocytic indices, total cholesterol 172, triglycerides 60, HDL 75, LDL 85, LDL particle # [id number], PT  13.2, INR 1.0  Recommendation:  Patient is here on a six-month office visit and follow-up of peripheral arterial disease.  His vascular examination is not significantly changed from prior examination, symptoms of claudication of remained stable.  But more importantly patient has noticed exertional chest discomfort in the form of feeling hot and the front of the chest that lasts for several minutes and is reviewed with rest and has been happening every time he exerts.  Symptoms are very atypical for angina pectoris.  In view of his vascular disease, was scheduled for a exercise Myoview stress test.  S/L NTG was prescribed and explained how to and when to use it and to notify us if there is change in frequency of use. Interaction with cialis-like agents (if applicable was discussed).  I'll obtain the results of the recently performed labs by his PCP, his lipids need to be closely followed.  His LDL was not previously at goal.  Blood pressure is well controlled.  He has remained abstinent from tobacco.  I'll like to see him back after the stress test and make further recommendations.  Adrian Prows, MD, [facility] 02/26/2018, 12:33 PM [facility] [place] Pager: [phone] Office: [phone] If no answer Cell [phone]

## 2018-02-26 ENCOUNTER — Ambulatory Visit: Payer: Medicare Other | Admitting: Cardiology

## 2018-02-26 ENCOUNTER — Encounter: Payer: Self-pay | Admitting: Cardiology

## 2018-02-26 VITALS — BP 130/70 | HR 47 | Ht 62.0 in | Wt 144.6 lb

## 2018-02-26 DIAGNOSIS — I739 Peripheral vascular disease, unspecified: Secondary | ICD-10-CM | POA: Diagnosis not present

## 2018-02-26 DIAGNOSIS — R001 Bradycardia, unspecified: Secondary | ICD-10-CM

## 2018-02-26 DIAGNOSIS — E7800 Pure hypercholesterolemia, unspecified: Secondary | ICD-10-CM

## 2018-02-26 DIAGNOSIS — I10 Essential (primary) hypertension: Secondary | ICD-10-CM

## 2018-02-26 DIAGNOSIS — I209 Angina pectoris, unspecified: Secondary | ICD-10-CM | POA: Diagnosis not present

## 2018-02-26 DIAGNOSIS — Z0189 Encounter for other specified special examinations: Secondary | ICD-10-CM

## 2018-02-26 DIAGNOSIS — Z87891 Personal history of nicotine dependence: Secondary | ICD-10-CM | POA: Diagnosis not present

## 2018-02-26 MED ORDER — NITROGLYCERIN 0.4 MG SL SUBL: 0.4000 mg | SUBLINGUAL_TABLET | SUBLINGUAL | 3 refills | Status: DC | PRN

## 2018-03-11 ENCOUNTER — Other Ambulatory Visit: Payer: Self-pay | Admitting: Cardiology

## 2018-03-11 ENCOUNTER — Ambulatory Visit: Payer: Medicare Other

## 2018-03-11 DIAGNOSIS — I739 Peripheral vascular disease, unspecified: Secondary | ICD-10-CM

## 2018-03-11 DIAGNOSIS — I209 Angina pectoris, unspecified: Secondary | ICD-10-CM

## 2018-03-11 NOTE — Progress Notes (Signed)
Patient wrongly ordered GXT. High risk for CAD and angina pectoris. Change to Exercise myoview stress

## 2018-03-14 NOTE — Addendum Note (Signed)
Addended by: Delrae Rend on: 03/14/2018 06:30 PM   Modules accepted: Orders

## 2018-03-16 ENCOUNTER — Other Ambulatory Visit: Payer: Medicare Other

## 2018-03-23 ENCOUNTER — Ambulatory Visit: Payer: Medicare Other | Admitting: Cardiology

## 2018-03-23 ENCOUNTER — Other Ambulatory Visit: Payer: Self-pay

## 2018-03-23 DIAGNOSIS — I739 Peripheral vascular disease, unspecified: Secondary | ICD-10-CM

## 2018-03-23 DIAGNOSIS — I200 Unstable angina: Secondary | ICD-10-CM | POA: Diagnosis not present

## 2018-03-23 DIAGNOSIS — I209 Angina pectoris, unspecified: Secondary | ICD-10-CM

## 2018-03-25 ENCOUNTER — Other Ambulatory Visit: Payer: Medicare Other

## 2018-03-25 NOTE — Progress Notes (Signed)
Subjective:.  Because  Primary Physician:  Iona Beard, MD  [name], male    DOB: 1945-05-24, 73 y.o.   MRN: [id number]  Chief Complaint  Patient presents with  . Hypertension  . Follow-up   HPI: Timothy Webster  is a 73 y.o. male  with PAD, hyperlipidemia, tobacco use disorder which he has quit in May 2017. He has left iliac artery stenting (2016) and left SFA and tibioperoneal trunk angioplasty and also right SFA angioplasty in 2016 and 2017 respectively and repeat angioplasty to his right SFA in 2019 again with atherectomy followed by drug coated balloon angioplasty. He has a spiral high-grade stenosis in the left proximal SFA disease.  I seen him about a month ago, for chest pain suggestive of angina pectoris and prescribed him sublingual nitroglycerin and he underwent nuclear stress test and presents her for follow-up.  He has used once abnormal nitroglycerin with relief during exertion activity.  He still continues to have cramping in his legs with activity.  States that both his legs, sometimes left is worse and has to stop and rest before he can complete his chores.  He is accompanied by his wife today.  Denies dyspnea, dizziness or syncope.  Past Medical History:  Diagnosis Date  . Glaucoma 02/25/2018  . Hyperlipidemia   . Hypertension   . PAD (peripheral artery disease) ([place])   . Sinus bradycardia on ECG 02/25/2018   EKG 10/12/2015: Marked sinus bradycardia at rate of 44 bpm, normal axis. No evidence of ischemia otherwise normal EKG. No significant change from EKG 04/19/2015: Normal sinus rhythm at rate of 52 bpm, EKG 12/24/2013: Marked sinus bradycardia at the rate of 39 bpm.  . Tobacco use disorder, severe, dependence 02/25/2018    Past Surgical History:  Procedure Laterality Date  . INSERTION OF ILIAC STENT  03/15/2014   Procedure: INSERTION OF ILIAC STENT;  Surgeon: Adrian Prows, MD;  Location: [facility] CATH LAB;  Service: Cardiovascular;;  LEIA   . LOWER  EXTREMITY ANGIOGRAM N/A 03/15/2014   Procedure: LOWER EXTREMITY ANGIOGRAM;  Surgeon: Adrian Prows, MD;  Location: [facility] CATH LAB;  Service: Cardiovascular;  Laterality: N/A;  . LOWER EXTREMITY ANGIOGRAM Right 04/26/2014   Procedure: LOWER EXTREMITY ANGIOGRAM;  Surgeon: Adrian Prows, MD;  Location: [facility] CATH LAB;  Service: Cardiovascular;  Laterality: Right;  . LOWER EXTREMITY ANGIOGRAPHY Bilateral 01/28/2017   Procedure: LOWER EXTREMITY ANGIOGRAPHY;  Surgeon: Adrian Prows, MD;  Location: [place] CV LAB;  Service: Cardiovascular;  Laterality: Bilateral;  . PERIPHERAL VASCULAR CATHETERIZATION Bilateral 05/16/2015   Procedure: Lower Extremity Angiography;  Surgeon: Adrian Prows, MD;  Location: [place] CV LAB;  Service: Cardiovascular;  Laterality: Bilateral;  . PERIPHERAL VASCULAR CATHETERIZATION Right 05/16/2015   Procedure: Peripheral Vascular Atherectomy;  Surgeon: Adrian Prows, MD;  Location: [place] CV LAB;  Service: Cardiovascular;  Laterality: Right;  sfa  . REFRACTIVE SURGERY     Right Eye    Social History   Socioeconomic History  . Marital status: Married    Spouse name: Not on file  . Number of children: 1  . Years of education: Not on file  . Highest education level: Not on file  Occupational History  . Not on file  Social Needs  . Financial resource strain: Not on file  . Food insecurity:    Worry: Not on file    Inability: Not on file  . Transportation needs:    Medical: Not on file  Non-medical: Not on file  Tobacco Use  . Smoking status: Former Smoker    Packs/day: 1.00    Years: 15.00    Pack years: 15.00    Types: Cigarettes    Last attempt to quit: 07/17/2015    Years since quitting: 2.6  . Smokeless tobacco: Never Used  Substance and Sexual Activity  . Alcohol use: No  . Drug use: No  . Sexual activity: Not on file  Lifestyle  . Physical activity:    Days per week: Not on file    Minutes per session: Not on file  . Stress: Not on file  Relationships  . Social  connections:    Talks on phone: Not on file    Gets together: Not on file    Attends religious service: Not on file    Active member of club or organization: Not on file    Attends meetings of clubs or organizations: Not on file    Relationship status: Not on file  . Intimate partner violence:    Fear of current or ex partner: Not on file    Emotionally abused: Not on file    Physically abused: Not on file    Forced sexual activity: Not on file  Other Topics Concern  . Not on file  Social History Narrative  . Not on file    Current Outpatient Medications on File Prior to Visit  Medication Sig Dispense Refill  . acetaminophen (TYLENOL) 500 MG tablet Take 1,000 mg by mouth every 6 (six) hours as needed (pain).    Marland Kitchen amLODipine-benazepril (LOTREL) 10-40 MG per capsule Take 1 capsule by mouth daily.    Marland Kitchen aspirin EC 81 MG tablet Take 81 mg by mouth daily.    . clopidogrel (PLAVIX) 75 MG tablet TAKE 1 TABLET BY MOUTH ONCE DAILY 90 tablet 0  . dorzolamide-timolol (COSOPT) 22.3-6.8 MG/ML ophthalmic solution Place 1 drop into the right eye 2 (two) times daily.    Marland Kitchen ezetimibe (ZETIA) 10 MG tablet Take 10 mg by mouth every evening.    . gabapentin (NEURONTIN) 300 MG capsule Take 600 mg by mouth 2 (two) times daily. MORNING & BEDTIME    . latanoprost (XALATAN) 0.005 % ophthalmic solution Place 1 drop into the right eye at bedtime.    . pravastatin (PRAVACHOL) 80 MG tablet Take 40 mg by mouth at bedtime.     . prednisoLONE acetate (PRED FORTE) 1 % ophthalmic suspension Place 1 drop into both eyes daily.    . timolol (TIMOPTIC) 0.5 % ophthalmic solution Place 1 drop into both eyes 2 (two) times daily.    . nitroGLYCERIN (NITROSTAT) 0.4 MG SL tablet Place 1 tablet (0.4 mg total) under the tongue every 5 (five) minutes as needed for up to 25 days for chest pain. 25 tablet 3   No current facility-administered medications on file prior to visit.    Review of Systems  Constitutional: Negative for  malaise/fatigue and weight loss.  Respiratory: Negative for cough, hemoptysis and shortness of breath.   Cardiovascular: Positive for chest pain. Negative for palpitations, claudication and leg swelling.  Gastrointestinal: Negative for abdominal pain, blood in stool, constipation, heartburn and vomiting.  Genitourinary: Negative for dysuria.  Musculoskeletal: Negative for joint pain and myalgias.  Neurological: Positive for tingling (bilateral feet). Negative for dizziness, focal weakness and headaches.  Endo/Heme/Allergies: Does not bruise/bleed easily.  Psychiatric/Behavioral: Negative for depression. The patient is not nervous/anxious.   All other systems reviewed and are negative.  Objective:  Blood pressure (!) 142/75, pulse (!) 53, height _0  (1.676 m), weight 139 lb 11.2 oz (63.4 kg), SpO2 98 %. Body mass index is 22.55 kg/m.   Physical Exam  Constitutional: He appears well-developed. No distress.  petite  HENT:  Head: Atraumatic.  Eyes: Conjunctivae are normal.  Neck: Neck supple. No JVD present. No thyromegaly present.  Cardiovascular: Normal rate, regular rhythm, normal heart sounds and intact distal pulses. Exam reveals no gallop.  No murmur heard. Pulses:      Carotid pulses are 2+ on the right side and 2+ on the left side.      Femoral pulses are 2+ on the right side with bruit and 2+ on the left side with bruit.      Popliteal pulses are 1+ on the right side and 1+ on the left side.       Dorsalis pedis pulses are 1+ on the right side and 0 on the left side.       Posterior tibial pulses are 2+ on the right side and 1+ on the left side.  Loud bifemoral bruit present. Loss of hair. Warm extremity.  Pulmonary/Chest: Effort normal. He has no wheezes. He has no rales.  Barrel shaped chest with decreased breath sounds bilateral.   Abdominal: Soft. Bowel sounds are normal.  Musculoskeletal: Normal range of motion.        General: No edema.  Neurological: He is  alert.  Skin: Skin is warm and dry.  Psychiatric: He has a normal mood and affect.   CARDIAC STUDIES:   Exercise Myoview stress test 03/23/2018: 1. The patient performed treadmill exercise using Bruce protocol, completing 5:50 minutes. The patient completed an estimated workload of 7 METS, reaching 83% of the maximum predicted heart rate. Exercise capacity was low. Hemodynamic response was normal. No stress symptoms reported. No ischemic changes seen on stress electrocardiogram.  Marginally sub-maximal stress test. 2.  The overall quality of the study is good. There is no evidence of abnormal lung activity. Stress and rest SPECT images demonstrate homogeneous tracer distribution throughout the myocardium. Gated SPECT imaging reveals normal myocardial thickening and wall motion. The left ventricular ejection fraction was calculated 46%, although visually appears normal.    3. Low risk study. Marginally sub-maximal study. Clinical correlation recommended.   ABI 02/17/2018: This exam reveals moderately decreased perfusion of the right lower extremity, (ABI 0.72 with moderately abnormal waveform and severely decreased perfusion of the left lower extremity, noted at the post tibial artery level (ABI 0.48) with severely abnormal waveform.  Compared to 08/14/2017, let ABI has further reduced from 0.59, was 0.63 on 05/07/2017.  [05/16/2015]: Peripheral arteriogram: Right SFA mid to distal 95% restenosis, 1.5 mm burr and 5x150 mm DCB. Stenosis reduced to 0-%. Atherectomy performed 04/26/2014 in the right peroneal artery is widely patent. TP trunk atherectomy widely patent. 2 vessel runoff in the form of peroneal and posterior tibial artery.  Angioplasty performed 03/15/2014 Left external iliac artery 9.0 x 60, 9.0 x 20 mm self-expanding stent. Left distal SFA and proximal popliteal and tibioperoneal trunk atherectomy with CSI 1.5 mm Crown site is widely patent. Left proximal SFA has a focal 80-90% stenosis. 2  vessel runoff in the form of peroneal and PT.  Assessment & Recommendations:   1. Atypical chest pain/Chest pain on exertion in the form of feeling hot. -  Low risk stress 02/2018  2. Essential hypertension, benign EKG 02/26/18/20: Marked sinus bradycardia at rate of 42 bpm, left atrial enlargement,  no evidence of ischemia, otherwise normal EKG. No significant change from EKG 05/14/2017: Marked sinus bradycardia at the rate of 48 bpm.  3. PVD (peripheral vascular disease) ([facility]) with claudication   4. H/O Tobacco use disorder, has remained abstinent.  5. Hyperlipidemia  7. Laboratory examination  05/06/2017: Cholesterol 161, triglycerides 83, HDL 52, LDL 92.  01/22/2017: Cholesterol 171, triglycerides 79, HDL 55, LDL 100, CBC normal. Creatinine 0.9, EGFR 76/88, sodium 145, potassium 5.2, BMP normal. INR 1.0, prothrombin time 10.7.  Recommendation:   Patient has had nitroglycerin responsive burning sensation in the chest and also in his back, has used nitroglycerin times one.  Advised him to continue to use it and if symptoms of angina get worse then we will consider cardiac catheterization.  Overall low risk stress test.  With regard to claudication, ABI has clearly decreased left worse than the right, I suspect that his subdural high-grade stenosis in the proximal SFA to be the etiology.  However patient and his wife state that he is able to do most things with mild to moderate amount of claudication, there like to wait for a few more months to see if the symptoms would improve with continued medical therapy.  I'll repeat ABI in 3 months and see him back at that time.  He is on a statin therapy, blood pressure is well controlled, he does have nitroglycerin to use, I do not have his recent labs although she states that lipids have been well-controlled.   Adrian Prows, MD, [facility] 03/26/2018, 11:51 AM [facility] [place] Pager: [phone] Office: [phone] If no answer Cell  [phone]

## 2018-03-26 ENCOUNTER — Encounter: Payer: Self-pay | Admitting: Cardiology

## 2018-03-26 ENCOUNTER — Other Ambulatory Visit: Payer: Self-pay

## 2018-03-26 ENCOUNTER — Ambulatory Visit: Payer: Medicare Other | Admitting: Cardiology

## 2018-03-26 VITALS — BP 142/75 | HR 53 | Ht 66.0 in | Wt 139.7 lb

## 2018-03-26 DIAGNOSIS — I10 Essential (primary) hypertension: Secondary | ICD-10-CM | POA: Diagnosis not present

## 2018-03-26 DIAGNOSIS — I209 Angina pectoris, unspecified: Secondary | ICD-10-CM

## 2018-03-26 DIAGNOSIS — E7800 Pure hypercholesterolemia, unspecified: Secondary | ICD-10-CM

## 2018-03-26 DIAGNOSIS — I739 Peripheral vascular disease, unspecified: Secondary | ICD-10-CM

## 2018-05-25 DIAGNOSIS — I10 Essential (primary) hypertension: Secondary | ICD-10-CM | POA: Diagnosis not present

## 2018-05-25 DIAGNOSIS — I739 Peripheral vascular disease, unspecified: Secondary | ICD-10-CM | POA: Diagnosis not present

## 2018-05-25 DIAGNOSIS — Z7189 Other specified counseling: Secondary | ICD-10-CM | POA: Diagnosis not present

## 2018-05-25 DIAGNOSIS — M25511 Pain in right shoulder: Secondary | ICD-10-CM | POA: Diagnosis not present

## 2018-06-22 ENCOUNTER — Other Ambulatory Visit: Payer: Medicare Other

## 2018-06-29 ENCOUNTER — Ambulatory Visit: Payer: Medicare Other | Admitting: Cardiology

## 2018-07-20 DIAGNOSIS — H401113 Primary open-angle glaucoma, right eye, severe stage: Secondary | ICD-10-CM | POA: Diagnosis not present

## 2018-07-20 DIAGNOSIS — H3581 Retinal edema: Secondary | ICD-10-CM | POA: Diagnosis not present

## 2018-07-27 ENCOUNTER — Other Ambulatory Visit: Payer: Self-pay | Admitting: Cardiology

## 2018-07-27 NOTE — Telephone Encounter (Signed)
Please fill if necessary

## 2018-07-28 ENCOUNTER — Telehealth: Payer: Self-pay

## 2018-07-28 NOTE — Telephone Encounter (Signed)
Can you schedule a follow up appt for this appt per [facility]

## 2018-07-28 NOTE — Telephone Encounter (Signed)
-----   Message from Adrian Prows, MD sent at 07/27/2018  8:34 PM EDT ----- Regarding: Needs OV please

## 2018-08-12 ENCOUNTER — Encounter: Payer: Self-pay | Admitting: Cardiology

## 2018-08-12 ENCOUNTER — Other Ambulatory Visit: Payer: Self-pay

## 2018-08-12 ENCOUNTER — Ambulatory Visit (INDEPENDENT_AMBULATORY_CARE_PROVIDER_SITE_OTHER): Payer: Medicare Other | Admitting: Cardiology

## 2018-08-12 VITALS — BP 120/66 | HR 50 | Ht 62.0 in | Wt 133.1 lb

## 2018-08-12 DIAGNOSIS — R001 Bradycardia, unspecified: Secondary | ICD-10-CM

## 2018-08-12 DIAGNOSIS — I739 Peripheral vascular disease, unspecified: Secondary | ICD-10-CM | POA: Diagnosis not present

## 2018-08-12 DIAGNOSIS — I10 Essential (primary) hypertension: Secondary | ICD-10-CM | POA: Diagnosis not present

## 2018-08-12 DIAGNOSIS — I209 Angina pectoris, unspecified: Secondary | ICD-10-CM | POA: Diagnosis not present

## 2018-08-12 DIAGNOSIS — E7800 Pure hypercholesterolemia, unspecified: Secondary | ICD-10-CM | POA: Diagnosis not present

## 2018-08-12 NOTE — Progress Notes (Signed)
Subjective:.  Because  Primary Physician:  Iona Beard, MD  [name], male    DOB: 16-Nov-1945, 73 y.o.   MRN: [id number]  Chief Complaint  Patient presents with  . Hypertension  . Follow-up   HPI: Timothy Webster  is a 73 y.o. male  with PAD, hyperlipidemia, tobacco use disorder which he has quit in May 2017. He has left iliac artery stenting (2016) and left SFA and tibioperoneal trunk angioplasty and also right SFA angioplasty in 2016 and 2017 respectively and repeat angioplasty to his right SFA in 2019 again with atherectomy followed by drug coated balloon angioplasty. He has a spiral high-grade stenosis in the left proximal SFA disease.  I seen him about a month ago, for chest pain suggestive of angina pectoris and prescribed him sublingual nitroglycerin and he underwent nuclear stress test and presents her for follow-up.  He has used 1 s/l  nitroglycerin with relief during exertion activity.  He still continues to have cramping in his legs with activity.  States that both his legs, sometimes left is worse and has to stop and rest before he can complete his chores.  He is accompanied by his wife today.  Denies dyspnea, dizziness or syncope.  Past Medical History:  Diagnosis Date  . Glaucoma 02/25/2018  . Hyperlipidemia   . Hypertension   . PAD (peripheral artery disease) ([place])   . Sinus bradycardia on ECG 02/25/2018   EKG 10/12/2015: Marked sinus bradycardia at rate of 44 bpm, normal axis. No evidence of ischemia otherwise normal EKG. No significant change from EKG 04/19/2015: Normal sinus rhythm at rate of 52 bpm, EKG 12/24/2013: Marked sinus bradycardia at the rate of 39 bpm.  . Tobacco use disorder, severe, dependence 02/25/2018    Past Surgical History:  Procedure Laterality Date  . INSERTION OF ILIAC STENT  03/15/2014   Procedure: INSERTION OF ILIAC STENT;  Surgeon: Adrian Prows, MD;  Location: [facility] CATH LAB;  Service: Cardiovascular;;  LEIA   . LOWER EXTREMITY  ANGIOGRAM N/A 03/15/2014   Procedure: LOWER EXTREMITY ANGIOGRAM;  Surgeon: Adrian Prows, MD;  Location: [facility] CATH LAB;  Service: Cardiovascular;  Laterality: N/A;  . LOWER EXTREMITY ANGIOGRAM Right 04/26/2014   Procedure: LOWER EXTREMITY ANGIOGRAM;  Surgeon: Adrian Prows, MD;  Location: [facility] CATH LAB;  Service: Cardiovascular;  Laterality: Right;  . LOWER EXTREMITY ANGIOGRAPHY Bilateral 01/28/2017   Procedure: LOWER EXTREMITY ANGIOGRAPHY;  Surgeon: Adrian Prows, MD;  Location: [place] CV LAB;  Service: Cardiovascular;  Laterality: Bilateral;  . PERIPHERAL VASCULAR CATHETERIZATION Bilateral 05/16/2015   Procedure: Lower Extremity Angiography;  Surgeon: Adrian Prows, MD;  Location: [place] CV LAB;  Service: Cardiovascular;  Laterality: Bilateral;  . PERIPHERAL VASCULAR CATHETERIZATION Right 05/16/2015   Procedure: Peripheral Vascular Atherectomy;  Surgeon: Adrian Prows, MD;  Location: [place] CV LAB;  Service: Cardiovascular;  Laterality: Right;  sfa  . REFRACTIVE SURGERY     Right Eye    Social History   Socioeconomic History  . Marital status: Married    Spouse name: Not on file  . Number of children: 1  . Years of education: Not on file  . Highest education level: Not on file  Occupational History  . Not on file  Social Needs  . Financial resource strain: Not on file  . Food insecurity    Worry: Not on file    Inability: Not on file  . Transportation needs    Medical: Not on file  Non-medical: Not on file  Tobacco Use  . Smoking status: Former Smoker    Packs/day: 1.00    Years: 15.00    Pack years: 15.00    Types: Cigarettes    Quit date: 07/17/2015    Years since quitting: 3.0  . Smokeless tobacco: Never Used  Substance and Sexual Activity  . Alcohol use: No  . Drug use: No  . Sexual activity: Not on file  Lifestyle  . Physical activity    Days per week: Not on file    Minutes per session: Not on file  . Stress: Not on file  Relationships  . Social Herbalist on  phone: Not on file    Gets together: Not on file    Attends religious service: Not on file    Active member of club or organization: Not on file    Attends meetings of clubs or organizations: Not on file    Relationship status: Not on file  . Intimate partner violence    Fear of current or ex partner: Not on file    Emotionally abused: Not on file    Physically abused: Not on file    Forced sexual activity: Not on file  Other Topics Concern  . Not on file  Social History Narrative  . Not on file    Current Outpatient Medications on File Prior to Visit  Medication Sig Dispense Refill  . acetaminophen (TYLENOL) 500 MG tablet Take 1,000 mg by mouth every 6 (six) hours as needed (pain).    Marland Kitchen amLODipine-benazepril (LOTREL) 10-40 MG per capsule Take 1 capsule by mouth daily.    Marland Kitchen aspirin EC 81 MG tablet Take 81 mg by mouth daily.    . clopidogrel (PLAVIX) 75 MG tablet TAKE 1 TABLET BY MOUTH  DAILY 90 tablet 0  . dorzolamide-timolol (COSOPT) 22.3-6.8 MG/ML ophthalmic solution Place 1 drop into the right eye 2 (two) times daily.    Marland Kitchen ezetimibe (ZETIA) 10 MG tablet Take 10 mg by mouth every evening.    . gabapentin (NEURONTIN) 300 MG capsule Take 600 mg by mouth 2 (two) times daily. MORNING & BEDTIME    . latanoprost (XALATAN) 0.005 % ophthalmic solution Place 1 drop into the right eye at bedtime.    . nitroGLYCERIN (NITROSTAT) 0.4 MG SL tablet Place 1 tablet (0.4 mg total) under the tongue every 5 (five) minutes as needed for up to 25 days for chest pain. 25 tablet 3  . pravastatin (PRAVACHOL) 80 MG tablet Take 40 mg by mouth at bedtime.     . prednisoLONE acetate (PRED FORTE) 1 % ophthalmic suspension Place 1 drop into both eyes daily.    . timolol (TIMOPTIC) 0.5 % ophthalmic solution Place 1 drop into both eyes 2 (two) times daily.     No current facility-administered medications on file prior to visit.    Review of Systems  Constitutional: Negative for malaise/fatigue and weight loss.   Respiratory: Negative for cough, hemoptysis and shortness of breath.   Cardiovascular: Positive for chest pain and claudication. Negative for palpitations and leg swelling.  Gastrointestinal: Negative for abdominal pain, blood in stool, constipation, heartburn and vomiting.  Genitourinary: Negative for dysuria.  Musculoskeletal: Negative for joint pain and myalgias.  Neurological: Positive for tingling (bilateral feet). Negative for dizziness, focal weakness and headaches.  Endo/Heme/Allergies: Does not bruise/bleed easily.  Psychiatric/Behavioral: Negative for depression. The patient is not nervous/anxious.   All other systems reviewed and are negative.  Objective:  Blood pressure 120/66, pulse (!) 50, height 5' 2"  (1.575 m), weight 133 lb 1.6 oz (60.4 kg), SpO2 98 %. Body mass index is 24.34 kg/m.  Physical Exam  Constitutional: He appears well-developed. No distress.  petite  HENT:  Head: Atraumatic.  Eyes: Conjunctivae are normal.  Neck: Neck supple. No JVD present. No thyromegaly present.  Cardiovascular: Normal rate, regular rhythm, normal heart sounds and intact distal pulses. Exam reveals no gallop.  No murmur heard. Pulses:      Carotid pulses are 2+ on the right side and 2+ on the left side.      Femoral pulses are 2+ on the right side with bruit and 2+ on the left side with bruit.      Popliteal pulses are 1+ on the right side and 1+ on the left side.       Dorsalis pedis pulses are 1+ on the right side and 0 on the left side.       Posterior tibial pulses are 2+ on the right side and 1+ on the left side.  Loud bifemoral bruit present. Loss of hair. Warm extremity.  Pulmonary/Chest: Effort normal. He has no wheezes. He has no rales.  Barrel shaped chest with decreased breath sounds bilateral.   Abdominal: Soft. Bowel sounds are normal.  Musculoskeletal: Normal range of motion.        General: No edema.  Neurological: He is alert.  Skin: Skin is warm and dry.   Psychiatric: He has a normal mood and affect.   Laboratory Examination: 05/06/2017: Cholesterol 161, triglycerides 83, HDL 52, LDL 92.   CMP Latest Ref Rng & Units 09/29/2017 09/29/2017 02/17/2016  Glucose 70 - 99 mg/dL 105(H) 108(H) 123(H)  BUN 8 - 23 mg/dL 16 17 14   Creatinine 0.61 - 1.24 mg/dL 1.90(H) 1.88(H) 0.91  Sodium 135 - 145 mmol/L 143 144 140  Potassium 3.5 - 5.1 mmol/L 3.6 3.5 3.3(L)  Chloride 98 - 111 mmol/L 115(H) 115(H) 107  CO2 22 - 32 mmol/L - 20(L) 24  Calcium 8.9 - 10.3 mg/dL - 8.9 9.6  Total Protein 6.5 - 8.1 g/dL - 7.1 7.5  Total Bilirubin 0.3 - 1.2 mg/dL - 0.7 0.5  Alkaline Phos 38 - 126 U/L - 49 49  AST 15 - 41 U/L - 22 27  ALT 0 - 44 U/L - 17 18   CBC Latest Ref Rng & Units 09/29/2017 09/29/2017 02/17/2016  WBC 4.0 - 10.5 K/uL - 14.1(H) 5.9  Hemoglobin 13.0 - 17.0 g/dL 11.9(L) 11.5(L) 13.4  Hematocrit 39.0 - 52.0 % 35.0(L) 35.4(L) 40.6  Platelets 150 - 400 K/uL - 224 219   Lipid Panel  No results found for: CHOL, TRIG, HDL, CHOLHDL, VLDL, LDLCALC, LDLDIRECT HEMOGLOBIN A1C No results found for: HGBA1C, MPG TSH No results for input(s): TSH in the last 8760 hours.   CARDIAC STUDIES:   Exercise Myoview stress test 03/23/2018: 1. The patient performed treadmill exercise using Bruce protocol, completing 5:50 minutes. The patient completed an estimated workload of 7 METS, reaching 83% of the maximum predicted heart rate. Exercise capacity was low. Hemodynamic response was normal. No stress symptoms reported. No ischemic changes seen on stress electrocardiogram.  Marginally sub-maximal stress test. 2.  The overall quality of the study is good. There is no evidence of abnormal lung activity. Stress and rest SPECT images demonstrate homogeneous tracer distribution throughout the myocardium. Gated SPECT imaging reveals normal myocardial thickening and wall motion. The left ventricular ejection fraction was calculated  46%, although visually appears normal.    3. Low risk  study. Marginally sub-maximal study. Clinical correlation recommended.   ABI 02/17/2018: This exam reveals moderately decreased perfusion of the right lower extremity, (ABI 0.72 with moderately abnormal waveform and severely decreased perfusion of the left lower extremity, noted at the post tibial artery level (ABI 0.48) with severely abnormal waveform.  Compared to 08/14/2017, let ABI has further reduced from 0.59, was 0.63 on 05/07/2017.  [05/16/2015]: Peripheral arteriogram: Right SFA mid to distal 95% restenosis, 1.5 mm burr and 5x150 mm DCB. Stenosis reduced to 0-%. Atherectomy performed 04/26/2014 in the right peroneal artery is widely patent. TP trunk atherectomy widely patent. 2 vessel runoff in the form of peroneal and posterior tibial artery.  Angioplasty performed 03/15/2014 Left external iliac artery 9.0 x 60, 9.0 x 20 mm self-expanding stent. Left distal SFA and proximal popliteal and tibioperoneal trunk atherectomy with CSI 1.5 mm Crown site is widely patent. Left proximal SFA has a focal 80-90% stenosis. 2 vessel runoff in the form of peroneal and PT.  Assessment & Recommendations:     ICD-10-CM   1. PVD (peripheral vascular disease) (HCC)  I73.9 PCV UPPER ARTERIAL DUPLEX (BILATERAL)  2. Essential hypertension, benign  I10 EKG 12-Lead    CBC    CMP14+EGFR  3. Sinus bradycardia on ECG  R00.1   4. Angina pectoris (HCC)  I20.9   5. Pure hypercholesterolemia  E78.00 Lipid Panel With LDL/HDL Ratio    Lipoprotein A (LPA)    TSH    LDL cholesterol, direct    EKG 08/12/2018: Marked sinus bradycardia at rate of 40 bpm, normal axis, no evidence of ischemia, otherwise normal EKG. No change from 02/08/2018.   Recommendation:   Patient has had nitroglycerin responsive burning sensation in the chest has used nitroglycerin ABOUT 1-2 A MONTH.  Advised him to continue to use it and if symptoms of angina get worse then we will consider cardiac catheterization.  Overall low risk stress test.   With regard to claudication, ABI needs to be repeated. It was not previously done due to [place]. Obtain Lower extremity arterial duplex/ABI for evaluation of PAD and or claudication. He has class 2-3 symptoms of claudication however patient prefers to wait before proceeding with peripheral arteriogram.  Symptoms equal in both legs/calves although ABI much lower in left leg and is severely reduced. ABI to monitor progression of disease.  Previously his ABI had reduced as noted above and suspect he probably has restenosis in the proximal segment of stent in the SFA.  In view of no critical limb ischemia, moderate symptoms of claudication, will continue observation unless ABI continues to further deteriorate then probably best option is to proceed with peripheral arteriogram for limb preservation.  He is on a statin therapy, blood pressure is well controlled, will obtain labs today. He is interested in [place] with [facility] in PAD and Post MI patients and will screen.  Adrian Prows, MD, [facility] 08/12/2018, 12:10 PM [facility]. [place] Pager: [phone] Office: [phone] If no answer Cell [phone]

## 2018-09-04 ENCOUNTER — Other Ambulatory Visit: Payer: Medicare Other

## 2018-10-30 DIAGNOSIS — E7800 Pure hypercholesterolemia, unspecified: Secondary | ICD-10-CM | POA: Diagnosis not present

## 2018-10-30 DIAGNOSIS — I10 Essential (primary) hypertension: Secondary | ICD-10-CM | POA: Diagnosis not present

## 2018-11-01 LAB — CBC
Hematocrit: 40.4 % (ref 37.5–51.0)
Hemoglobin: 13.4 g/dL (ref 13.0–17.7)
MCH: 31.7 pg (ref 26.6–33.0)
MCHC: 33.2 g/dL (ref 31.5–35.7)
MCV: 96 fL (ref 79–97)
Platelets: 234 10*3/uL (ref 150–450)
RBC: 4.23 x10E6/uL (ref 4.14–5.80)
RDW: 13 % (ref 11.6–15.4)
WBC: 6.1 10*3/uL (ref 3.4–10.8)

## 2018-11-01 LAB — CMP14+EGFR
ALT: 12 IU/L (ref 0–44)
AST: 18 IU/L (ref 0–40)
Albumin/Globulin Ratio: 1.4 (ref 1.2–2.2)
Albumin: 4.2 g/dL (ref 3.7–4.7)
Alkaline Phosphatase: 72 IU/L (ref 39–117)
BUN/Creatinine Ratio: 16 (ref 10–24)
BUN: 13 mg/dL (ref 8–27)
Bilirubin Total: 0.3 mg/dL (ref 0.0–1.2)
CO2: 23 mmol/L (ref 20–29)
Calcium: 9.3 mg/dL (ref 8.6–10.2)
Chloride: 108 mmol/L — ABNORMAL HIGH (ref 96–106)
Creatinine, Ser: 0.83 mg/dL (ref 0.76–1.27)
GFR calc Af Amer: 101 mL/min/{1.73_m2} (ref >59)
GFR calc non Af Amer: 87 mL/min/{1.73_m2} (ref >59)
Globulin, Total: 2.9 g/dL (ref 1.5–4.5)
Glucose: 95 mg/dL (ref 65–99)
Potassium: 3.9 mmol/L (ref 3.5–5.2)
Sodium: 142 mmol/L (ref 134–144)
Total Protein: 7.1 g/dL (ref 6.0–8.5)

## 2018-11-01 LAB — LIPID PANEL WITH LDL/HDL RATIO
Cholesterol, Total: 168 mg/dL (ref 100–199)
HDL: 50 mg/dL (ref >39)
LDL Chol Calc (NIH): 102 mg/dL — ABNORMAL HIGH (ref 0–99)
LDL/HDL Ratio: 2 ratio (ref 0.0–3.6)
Triglycerides: 84 mg/dL (ref 0–149)
VLDL Cholesterol Cal: 16 mg/dL (ref 5–40)

## 2018-11-01 LAB — TSH: TSH: 1.11 u[IU]/mL (ref 0.450–4.500)

## 2018-11-01 LAB — LIPOPROTEIN A (LPA): Lipoprotein (a): 106.3 nmol/L — ABNORMAL HIGH (ref <75.0)

## 2018-11-01 LAB — LDL CHOLESTEROL, DIRECT: LDL Direct: 105 mg/dL — ABNORMAL HIGH (ref 0–99)

## 2018-11-02 ENCOUNTER — Other Ambulatory Visit: Payer: Self-pay

## 2018-11-02 ENCOUNTER — Ambulatory Visit (INDEPENDENT_AMBULATORY_CARE_PROVIDER_SITE_OTHER): Payer: Medicare Other

## 2018-11-02 DIAGNOSIS — I739 Peripheral vascular disease, unspecified: Secondary | ICD-10-CM

## 2018-11-03 ENCOUNTER — Other Ambulatory Visit: Payer: Self-pay | Admitting: Cardiology

## 2018-11-03 DIAGNOSIS — I739 Peripheral vascular disease, unspecified: Secondary | ICD-10-CM

## 2018-11-03 DIAGNOSIS — E785 Hyperlipidemia, unspecified: Secondary | ICD-10-CM | POA: Diagnosis not present

## 2018-11-03 DIAGNOSIS — Z23 Encounter for immunization: Secondary | ICD-10-CM | POA: Diagnosis not present

## 2018-11-03 DIAGNOSIS — I10 Essential (primary) hypertension: Secondary | ICD-10-CM | POA: Diagnosis not present

## 2018-11-05 ENCOUNTER — Other Ambulatory Visit: Payer: Self-pay | Admitting: Cardiology

## 2018-11-06 NOTE — Progress Notes (Signed)
Please schedule

## 2018-11-12 ENCOUNTER — Ambulatory Visit: Payer: Medicare Other | Admitting: Cardiology

## 2018-11-13 ENCOUNTER — Other Ambulatory Visit: Payer: Self-pay

## 2018-11-13 ENCOUNTER — Ambulatory Visit (INDEPENDENT_AMBULATORY_CARE_PROVIDER_SITE_OTHER): Payer: Medicare Other

## 2018-11-13 DIAGNOSIS — I739 Peripheral vascular disease, unspecified: Secondary | ICD-10-CM | POA: Diagnosis not present

## 2018-11-20 ENCOUNTER — Encounter: Payer: Self-pay | Admitting: Cardiology

## 2018-11-20 ENCOUNTER — Other Ambulatory Visit: Payer: Self-pay

## 2018-11-20 ENCOUNTER — Ambulatory Visit: Payer: Medicare Other | Admitting: Cardiology

## 2018-11-20 VITALS — BP 138/72 | HR 57 | Ht 62.0 in | Wt 131.7 lb

## 2018-11-20 DIAGNOSIS — I739 Peripheral vascular disease, unspecified: Secondary | ICD-10-CM | POA: Diagnosis not present

## 2018-11-20 DIAGNOSIS — E7800 Pure hypercholesterolemia, unspecified: Secondary | ICD-10-CM | POA: Diagnosis not present

## 2018-11-20 DIAGNOSIS — I10 Essential (primary) hypertension: Secondary | ICD-10-CM | POA: Diagnosis not present

## 2018-11-20 DIAGNOSIS — M19021 Primary osteoarthritis, right elbow: Secondary | ICD-10-CM | POA: Diagnosis not present

## 2018-11-20 MED ORDER — MELOXICAM 7.5 MG PO TABS: 7.5000 mg | ORAL_TABLET | Freq: Every day | ORAL | 0 refills | Status: DC

## 2018-11-20 MED ORDER — ROSUVASTATIN CALCIUM 20 MG PO TABS: 20.0000 mg | ORAL_TABLET | Freq: Every day | ORAL | 3 refills | Status: DC

## 2018-11-20 NOTE — Progress Notes (Signed)
Primary Physician/Referring:  [name], MD  [name], male    DOB: Nov 09, 1945, 73 y.o.   MRN: [id number]  Chief Complaint  Patient presents with   PVD   Hypertension   HPI:    Timothy Webster  is a 73 y.o. AAM male  with PAD, hyperlipidemia, tobacco use disorder which he has quit in May 2017. He has left iliac artery stenting (2016) and left SFA and tibioperoneal trunk angioplasty and also right SFA angioplasty in 2016 and 2017 respectively and repeat angioplasty to his right SFA in 2019 again with atherectomy followed by drug coated balloon angioplasty. He has a residual high-grade stenosis in the left proximal SFA disease.    He has used 2-3 s/l  nitroglycerin with relief during exertion activity on a monthly basis, stable. Low to intermediate stress test in March 2020.  He still continues to have cramping in his legs with activity.  States that both his legs, sometimes left is worse and has to stop and rest before he can complete his chores, again states that it is stable.  No ulceration or rest pain. Denies dyspnea, dizziness or syncope.  He also complains of pain in his right shoulder and also right elbow and request "something".  Started about 2-3 weeks ago.   Past Medical History:  Diagnosis Date   Glaucoma 02/25/2018   Hyperlipidemia    Hypertension    PAD (peripheral artery disease) (HCC)    Sinus bradycardia on ECG 02/25/2018   EKG 10/12/2015: Marked sinus bradycardia at rate of 44 bpm, normal axis. No evidence of ischemia otherwise normal EKG. No significant change from EKG 04/19/2015: Normal sinus rhythm at rate of 52 bpm, EKG 12/24/2013: Marked sinus bradycardia at the rate of 39 bpm.   Tobacco use disorder, severe, dependence 02/25/2018   Past Surgical History:  Procedure Laterality Date   INSERTION OF ILIAC STENT  03/15/2014   Procedure: INSERTION OF ILIAC STENT;  Surgeon: Yates Decamp, MD;  Location: [facility] CATH LAB;  Service: Cardiovascular;;   LEIA    LOWER EXTREMITY ANGIOGRAM N/A 03/15/2014   Procedure: LOWER EXTREMITY ANGIOGRAM;  Surgeon: Yates Decamp, MD;  Location: [facility] CATH LAB;  Service: Cardiovascular;  Laterality: N/A;   LOWER EXTREMITY ANGIOGRAM Right 04/26/2014   Procedure: LOWER EXTREMITY ANGIOGRAM;  Surgeon: Yates Decamp, MD;  Location: [facility] CATH LAB;  Service: Cardiovascular;  Laterality: Right;   LOWER EXTREMITY ANGIOGRAPHY Bilateral 01/28/2017   Procedure: LOWER EXTREMITY ANGIOGRAPHY;  Surgeon: Yates Decamp, MD;  Location: [facility] INVASIVE CV LAB;  Service: Cardiovascular;  Laterality: Bilateral;   PERIPHERAL VASCULAR CATHETERIZATION Bilateral 05/16/2015   Procedure: Lower Extremity Angiography;  Surgeon: Yates Decamp, MD;  Location: [facility] INVASIVE CV LAB;  Service: Cardiovascular;  Laterality: Bilateral;   PERIPHERAL VASCULAR CATHETERIZATION Right 05/16/2015   Procedure: Peripheral Vascular Atherectomy;  Surgeon: Yates Decamp, MD;  Location: [facility] INVASIVE CV LAB;  Service: Cardiovascular;  Laterality: Right;  sfa   REFRACTIVE SURGERY     Right Eye   Social History   Socioeconomic History   Marital status: Married    Spouse name: Not on file   Number of children: 1   Years of education: Not on file   Highest education level: Not on file  Occupational History   Not on file  Social Needs   Financial resource strain: Not on file   Food insecurity    Worry: Not on file    Inability: Not on file   Transportation needs  Medical: Not on file    Non-medical: Not on file  Tobacco Use   Smoking status: Former Smoker    Packs/day: 1.00    Years: 15.00    Pack years: 15.00    Types: Cigarettes    Quit date: 07/17/2015    Years since quitting: 3.3   Smokeless tobacco: Never Used  Substance and Sexual Activity   Alcohol use: No   Drug use: No   Sexual activity: Not on file  Lifestyle   Physical activity    Days per week: Not on file    Minutes per session: Not on file   Stress: Not on file  Relationships   Social  connections    Talks on phone: Not on file    Gets together: Not on file    Attends religious service: Not on file    Active member of club or organization: Not on file    Attends meetings of clubs or organizations: Not on file    Relationship status: Not on file   Intimate partner violence    Fear of current or ex partner: Not on file    Emotionally abused: Not on file    Physically abused: Not on file    Forced sexual activity: Not on file  Other Topics Concern   Not on file  Social History Narrative   Not on file   ROS  Review of Systems  Constitution: Negative for chills, decreased appetite, malaise/fatigue and weight gain.  Cardiovascular: Positive for chest pain and claudication. Negative for dyspnea on exertion, leg swelling and syncope.  Endocrine: Negative for cold intolerance.  Hematologic/Lymphatic: Does not bruise/bleed easily.  Musculoskeletal: Positive for joint pain (right shoulder and right elbow). Negative for joint swelling.  Gastrointestinal: Negative for abdominal pain, anorexia, change in bowel habit, hematochezia and melena.  Neurological: Negative for headaches and light-headedness.  Psychiatric/Behavioral: Negative for depression and substance abuse.  All other systems reviewed and are negative.  Objective   Vitals with BMI 11/20/2018 08/12/2018 03/26/2018  Height 5\' 2"  5\' 2"  5\' 6"   Weight 131 lbs 11 oz 133 lbs 2 oz 139 lbs 11 oz  BMI 24.08 24.34 22.56  Systolic 138 120  Diastolic 72 66 75  Pulse 57 50 53      Physical Exam  Constitutional: He appears well-developed. No distress.  petite  HENT:  Head: Atraumatic.  Eyes: Conjunctivae are normal.  Neck: Neck supple. No JVD present. No thyromegaly present.  Cardiovascular: Normal rate, regular rhythm, normal heart sounds and intact distal pulses. Exam reveals no gallop.  No murmur heard. Pulses:      Carotid pulses are 2+ on the right side and 2+ on the left side.      Femoral pulses are 2+  on the right side with bruit and 2+ on the left side with bruit.      Popliteal pulses are 1+ on the right side and 1+ on the left side.       Dorsalis pedis pulses are 1+ on the right side and 0 on the left side.       Posterior tibial pulses are 2+ on the right side and 1+ on the left side.  Loud bifemoral bruit present. Loss of hair. Warm extremity. No edema. No JVD.   Pulmonary/Chest: Effort normal. He has no wheezes. He has no rales.  Barrel shaped chest with decreased breath sounds bilateral.   Abdominal: Soft. Bowel sounds are normal.  Musculoskeletal: Normal range of motion.  General: Tenderness (Tennis elbow) present. No edema.  Neurological: He is alert.  Skin: Skin is warm and dry.  Psychiatric: He has a normal mood and affect.   Laboratory examination:    Recent Labs    10/30/18 0958  NA 142  K 3.9  CL 108*  CO2 23  GLUCOSE 95  BUN 13  CREATININE 0.83  CALCIUM 9.3  GFRNONAA 87  GFRAA 101   CrCl cannot be calculated (Patient's most recent lab result is older than the maximum 21 days allowed.).  CMP Latest Ref Rng & Units 10/30/2018 09/29/2017 09/29/2017  Glucose 65 - 99 mg/dL 95 213(Y105(H) 865(H108(H)  BUN 8 - 27 mg/dL 13 16 17   Creatinine 0.76 - 1.27 mg/dL 8.460.83 9.62(X1.90(H) 5.28(U1.88(H)  Sodium 134 - 144 mmol/L 142 143 144  Potassium 3.5 - 5.2 mmol/L 3.9 3.6 3.5  Chloride 96 - 106 mmol/L 108(H) 115(H) 115(H)  CO2 20 - 29 mmol/L 23 - 20(L)  Calcium 8.6 - 10.2 mg/dL 9.3 - 8.9  Total Protein 6.0 - 8.5 g/dL 7.1 - 7.1  Total Bilirubin 0.0 - 1.2 mg/dL 0.3 - 0.7  Alkaline Phos 39 - 117 IU/L 72 - 49  AST 0 - 40 IU/L 18 - 22  ALT 0 - 44 IU/L 12 - 17   CBC Latest Ref Rng & Units 10/30/2018 09/29/2017 09/29/2017  WBC 3.4 - 10.8 x10E3/uL 6.1 - 14.1(H)  Hemoglobin 13.0 - 17.7 g/dL 13.213.4 11.9(L) 11.5(L)  Hematocrit 37.5 - 51.0 % 40.4 35.0(L) 35.4(L)  Platelets 150 - 450 x10E3/uL 234 - 224   Lipid Panel     Component Value Date/Time   CHOL 168 10/30/2018 0958   TRIG 84 10/30/2018  0958   HDL 50 10/30/2018 0958   LDLCALC 102 (H) 10/30/2018 0958   LDLDIRECT 105 (H) 10/30/2018 0958   HEMOGLOBIN A1C No results found for: HGBA1C, MPG TSH Recent Labs    10/30/18 0958  TSH 1.110   Medications and allergies  No Known Allergies   Current Outpatient Medications  Medication Instructions   acetaminophen (TYLENOL) 1,000 mg, Oral, Every 6 hours PRN   amLODipine-benazepril (LOTREL) 10-40 MG per capsule 1 capsule, Oral, Daily   aspirin EC 81 mg, Oral, Daily   clopidogrel (PLAVIX) 75 MG tablet TAKE 1 TABLET BY MOUTH  DAILY   dorzolamide-timolol (COSOPT) 22.3-6.8 MG/ML ophthalmic solution 1 drop, Right Eye, 2 times daily   ezetimibe (ZETIA) 10 mg, Oral, Every evening   gabapentin (NEURONTIN) 600 mg, Oral, 2 times daily, MORNING & BEDTIME   latanoprost (XALATAN) 0.005 % ophthalmic solution 1 drop, Right Eye, Daily at bedtime   meloxicam (MOBIC) 7.5 mg, Oral, Daily   nitroGLYCERIN (NITROSTAT) 0.4 mg, Sublingual, Every 5 min PRN   prednisoLONE acetate (PRED FORTE) 1 % ophthalmic suspension 1 drop, Both Eyes, Daily   rosuvastatin (CRESTOR) 20 mg, Oral, Daily    Radiology:  No results found. Cardiac Studies:   Exercise Myoview stress test 03/23/2018: 1. The patient performed treadmill exercise using Bruce protocol, completing 5:50 minutes. The patient completed an estimated workload of 7 METS, reaching 83% of the maximum predicted heart rate. Exercise capacity was low. Hemodynamic response was normal. No stress symptoms reported. No ischemic changes seen on stress electrocardiogram.  Marginally sub-maximal stress test. 2.  The overall quality of the study is good. There is no evidence of abnormal lung activity. Stress and rest SPECT images demonstrate homogeneous tracer distribution throughout the myocardium. Gated SPECT imaging reveals normal myocardial thickening and wall motion. The  left ventricular ejection fraction was calculated 46%, although visually appears  normal.    3. Low risk study. Marginally sub-maximal study. Clinical correlation recommended.   [05/16/2015]: Peripheral arteriogram: Right SFA mid to distal 95% restenosis, 1.5 mm burr and 5x150 mm DCB. Stenosis reduced to 0-%. Atherectomy performed 04/26/2014 in the right peroneal artery is widely patent. TP trunk atherectomy widely patent. 2 vessel runoff in the form of peroneal and posterior tibial artery.  Angioplasty performed 03/15/2014 Left external iliac artery 9.0 x 60, 9.0 x 20 mm self-expanding stent. Left distal SFA and proximal popliteal and tibioperoneal trunk atherectomy with CSI 1.5 mm Crown site is widely patent. Left proximal SFA has a focal 80-90% stenosis. 2 vessel runoff in the form of peroneal and PT.  ABI 11/13/2018: This exam reveals moderately decreased perfusion of the right lower extremity, noted at the anterior tibial and post tibial artery level (ABI 0.73). This exam reveals moderately decreased perfusion of the left lower extremity, noted at the anterior tibial and post tibial artery level (ABI 0.64).  Compared to the study done on 02/17/2018, no significant change, right ABI 0.72 and left ABI 0.48.  Assessment     ICD-10-CM   1. PVD (peripheral vascular disease) (HCC)  I73.9   2. Essential hypertension, benign  I10   3. Pure hypercholesterolemia  E78.00 rosuvastatin (CRESTOR) 20 MG tablet    Lipid Panel With LDL/HDL Ratio  4. Arthritis of right elbow  M19.021 meloxicam (MOBIC) 7.5 MG tablet    EKG 08/12/2018: Marked sinus bradycardia at rate of 40 bpm, normal axis, no evidence of ischemia, otherwise normal EKG. No change from 02/08/2018.   Recommendations:   Meds ordered this encounter  Medications   meloxicam (MOBIC) 7.5 MG tablet    Sig: Take 1 tablet (7.5 mg total) by mouth daily.    Dispense:  14 tablet    Refill:  0   rosuvastatin (CRESTOR) 20 MG tablet    Sig: Take 1 tablet (20 mg total) by mouth daily.    Dispense:  90 tablet    Refill:  3     Discontinue pravastatin    SMOKEY MELOTT  is a 73 y.o.  AAM male  with PAD, hyperlipidemia, tobacco use disorder which he has quit in May 2017. He has left iliac artery stenting (2016) and left SFA and tibioperoneal trunk angioplasty and also right SFA angioplasty in 2016 and 2017 respectively and repeat angioplasty to his right SFA in 2019 again with atherectomy followed by drug coated balloon angioplasty. He has a residual high-grade stenosis in the left proximal SFA disease with mild symptoms of claudication.  He also has asymptomatic marked S. Bradycardia.   I had seen him 3 months ago, he had nitrate responsive chest pain. Continues to have occasional angina and uses 2-3 S/L NTG in a month.  Recommended continued medical therapy and observation for now.  With regard to claudication, symptoms equal in both legs/calves. The symptoms are stable per patient and no ulceration or rest pain. Continue present medications.    He is on a statin therapy, lipids are not well controlled, will discontinue Pravastatin and switch to Crestor 20 mg daily and continue Zetia to his present medications.  Blood pressure is well controlled, will obtain labs in 4-6 weeks. For his right elbow arthritis, I prescribed meloxicam for 2 weeks only. I will see him in 6 months.   He is interested in [facility] Trial with [facility] in PAD and Post MI patients and  will screen.  Adrian Prows, MD, [facility] 11/20/2018, 2:01 PM [facility] [place] Pager: [phone] Office: [phone] If no answer Cell [phone]

## 2018-12-22 DIAGNOSIS — H3091 Unspecified chorioretinal inflammation, right eye: Secondary | ICD-10-CM | POA: Diagnosis not present

## 2018-12-22 DIAGNOSIS — H4051X3 Glaucoma secondary to other eye disorders, right eye, severe stage: Secondary | ICD-10-CM | POA: Diagnosis not present

## 2018-12-22 DIAGNOSIS — H35351 Cystoid macular degeneration, right eye: Secondary | ICD-10-CM | POA: Diagnosis not present

## 2018-12-24 ENCOUNTER — Other Ambulatory Visit: Payer: Self-pay | Admitting: Cardiology

## 2018-12-24 DIAGNOSIS — E7800 Pure hypercholesterolemia, unspecified: Secondary | ICD-10-CM | POA: Diagnosis not present

## 2018-12-24 DIAGNOSIS — H3091 Unspecified chorioretinal inflammation, right eye: Secondary | ICD-10-CM | POA: Diagnosis not present

## 2018-12-25 ENCOUNTER — Telehealth: Payer: Self-pay

## 2018-12-25 LAB — LIPID PANEL WITH LDL/HDL RATIO
Cholesterol, Total: 153 mg/dL (ref 100–199)
HDL: 57 mg/dL (ref >39)
LDL Chol Calc (NIH): 79 mg/dL (ref 0–99)
LDL/HDL Ratio: 1.4 ratio (ref 0.0–3.6)
Triglycerides: 90 mg/dL (ref 0–149)
VLDL Cholesterol Cal: 17 mg/dL (ref 5–40)

## 2018-12-25 NOTE — Telephone Encounter (Signed)
Gave patient results he verbalized understanding

## 2018-12-25 NOTE — Progress Notes (Signed)
Normal cholesterol. Continue present medications. LDL improved from 102

## 2018-12-25 NOTE — Telephone Encounter (Signed)
-----   Message from Adrian Prows, MD sent at 12/25/2018  6:37 AM EST ----- Normal cholesterol. Continue present medications. LDL improved from 102

## 2019-01-05 DIAGNOSIS — H35351 Cystoid macular degeneration, right eye: Secondary | ICD-10-CM | POA: Diagnosis not present

## 2019-01-05 DIAGNOSIS — H3091 Unspecified chorioretinal inflammation, right eye: Secondary | ICD-10-CM | POA: Diagnosis not present

## 2019-01-05 DIAGNOSIS — H31421 Serous choroidal detachment, right eye: Secondary | ICD-10-CM | POA: Diagnosis not present

## 2019-01-05 DIAGNOSIS — H4051X3 Glaucoma secondary to other eye disorders, right eye, severe stage: Secondary | ICD-10-CM | POA: Diagnosis not present

## 2019-02-02 DIAGNOSIS — E785 Hyperlipidemia, unspecified: Secondary | ICD-10-CM | POA: Diagnosis not present

## 2019-02-02 DIAGNOSIS — M25511 Pain in right shoulder: Secondary | ICD-10-CM | POA: Diagnosis not present

## 2019-02-02 DIAGNOSIS — M25521 Pain in right elbow: Secondary | ICD-10-CM | POA: Diagnosis not present

## 2019-02-02 DIAGNOSIS — I10 Essential (primary) hypertension: Secondary | ICD-10-CM | POA: Diagnosis not present

## 2019-02-23 DIAGNOSIS — H3091 Unspecified chorioretinal inflammation, right eye: Secondary | ICD-10-CM | POA: Diagnosis not present

## 2019-02-23 DIAGNOSIS — H4051X3 Glaucoma secondary to other eye disorders, right eye, severe stage: Secondary | ICD-10-CM | POA: Diagnosis not present

## 2019-02-23 DIAGNOSIS — H44522 Atrophy of globe, left eye: Secondary | ICD-10-CM | POA: Diagnosis not present

## 2019-02-23 DIAGNOSIS — H35351 Cystoid macular degeneration, right eye: Secondary | ICD-10-CM | POA: Diagnosis not present

## 2019-02-26 DIAGNOSIS — H401113 Primary open-angle glaucoma, right eye, severe stage: Secondary | ICD-10-CM | POA: Diagnosis not present

## 2019-04-12 DIAGNOSIS — I739 Peripheral vascular disease, unspecified: Secondary | ICD-10-CM | POA: Diagnosis not present

## 2019-04-12 DIAGNOSIS — I10 Essential (primary) hypertension: Secondary | ICD-10-CM | POA: Diagnosis not present

## 2019-04-12 DIAGNOSIS — R001 Bradycardia, unspecified: Secondary | ICD-10-CM | POA: Diagnosis not present

## 2019-04-12 DIAGNOSIS — E785 Hyperlipidemia, unspecified: Secondary | ICD-10-CM | POA: Diagnosis not present

## 2019-04-13 ENCOUNTER — Other Ambulatory Visit (HOSPITAL_COMMUNITY): Payer: Self-pay | Admitting: Family Medicine

## 2019-04-13 ENCOUNTER — Other Ambulatory Visit: Payer: Self-pay | Admitting: Family Medicine

## 2019-04-13 DIAGNOSIS — Z87891 Personal history of nicotine dependence: Secondary | ICD-10-CM

## 2019-04-13 DIAGNOSIS — F17209 Nicotine dependence, unspecified, with unspecified nicotine-induced disorders: Secondary | ICD-10-CM

## 2019-04-20 DIAGNOSIS — H4051X3 Glaucoma secondary to other eye disorders, right eye, severe stage: Secondary | ICD-10-CM | POA: Diagnosis not present

## 2019-04-20 DIAGNOSIS — H3091 Unspecified chorioretinal inflammation, right eye: Secondary | ICD-10-CM | POA: Diagnosis not present

## 2019-04-20 DIAGNOSIS — H35351 Cystoid macular degeneration, right eye: Secondary | ICD-10-CM | POA: Diagnosis not present

## 2019-04-20 DIAGNOSIS — H44522 Atrophy of globe, left eye: Secondary | ICD-10-CM | POA: Diagnosis not present

## 2019-04-26 ENCOUNTER — Other Ambulatory Visit: Payer: Self-pay | Admitting: Cardiology

## 2019-04-26 DIAGNOSIS — I209 Angina pectoris, unspecified: Secondary | ICD-10-CM

## 2019-04-27 ENCOUNTER — Ambulatory Visit (HOSPITAL_COMMUNITY): Admission: RE | Admit: 2019-04-27 | Payer: Medicare Other | Source: Ambulatory Visit

## 2019-04-27 ENCOUNTER — Encounter (HOSPITAL_COMMUNITY): Payer: Self-pay

## 2019-04-29 ENCOUNTER — Encounter (HOSPITAL_COMMUNITY): Payer: Self-pay

## 2019-04-29 ENCOUNTER — Ambulatory Visit (HOSPITAL_COMMUNITY): Payer: Medicare Other

## 2019-05-18 NOTE — Progress Notes (Signed)
Primary Physician/Referring:  [name], MD  [name], male    DOB: 06-24-1945, 74 y.o.   MRN: [id number]  Chief Complaint  Patient presents with  . Chest Pain  . PAD  . Follow-up    6 month   HPI:    Timothy Webster  is a 74 y.o. AAM male  with PAD, hyperlipidemia, tobacco use disorder which he has quit in May 2017. He has left iliac artery stenting (2016) and left SFA and tibioperoneal trunk angioplasty and also right SFA angioplasty in 2016 and 2017 respectively and repeat angioplasty to his right SFA in 2019 again with atherectomy followed by drug coated balloon angioplasty. He has a residual high-grade stenosis in the left proximal SFA disease.    He presents here for follow-up of peripheral arterial disease, hyperlipidemia, he has reduced his physical activity due to pain in bilateral calves, unable to tell me which leg hurts more.  He has not noticed any bluish discoloration or rest pain.  With regard to chest pain, he has not used any sublingual nitroglycerin and he has not had any chest tightness although he does say occasionally he gets heartburn especially when he eats spicy food.  He also complains of pain in his right shoulder and also right elbow and request "something".  Started about 2-3 weeks ago.   Past Medical History:  Diagnosis Date  . Glaucoma 02/25/2018  . Hyperlipidemia   . Hypertension   . PAD (peripheral artery disease) (HCC)   . Sinus bradycardia on ECG 02/25/2018   EKG 10/12/2015: Marked sinus bradycardia at rate of 44 bpm, normal axis. No evidence of ischemia otherwise normal EKG. No significant change from EKG 04/19/2015: Normal sinus rhythm at rate of 52 bpm, EKG 12/24/2013: Marked sinus bradycardia at the rate of 39 bpm.  . Tobacco use disorder, severe, dependence 02/25/2018   Past Surgical History:  Procedure Laterality Date  . INSERTION OF ILIAC STENT  03/15/2014   Procedure: INSERTION OF ILIAC STENT;  Surgeon: Yates Decamp, MD;   Location: [facility] CATH LAB;  Service: Cardiovascular;;  LEIA   . LOWER EXTREMITY ANGIOGRAM N/A 03/15/2014   Procedure: LOWER EXTREMITY ANGIOGRAM;  Surgeon: Yates Decamp, MD;  Location: [facility] CATH LAB;  Service: Cardiovascular;  Laterality: N/A;  . LOWER EXTREMITY ANGIOGRAM Right 04/26/2014   Procedure: LOWER EXTREMITY ANGIOGRAM;  Surgeon: Yates Decamp, MD;  Location: [facility] CATH LAB;  Service: Cardiovascular;  Laterality: Right;  . LOWER EXTREMITY ANGIOGRAPHY Bilateral 01/28/2017   Procedure: LOWER EXTREMITY ANGIOGRAPHY;  Surgeon: Yates Decamp, MD;  Location: [facility] INVASIVE CV LAB;  Service: Cardiovascular;  Laterality: Bilateral;  . PERIPHERAL VASCULAR CATHETERIZATION Bilateral 05/16/2015   Procedure: Lower Extremity Angiography;  Surgeon: Yates Decamp, MD;  Location: [facility] INVASIVE CV LAB;  Service: Cardiovascular;  Laterality: Bilateral;  . PERIPHERAL VASCULAR CATHETERIZATION Right 05/16/2015   Procedure: Peripheral Vascular Atherectomy;  Surgeon: Yates Decamp, MD;  Location: [facility] INVASIVE CV LAB;  Service: Cardiovascular;  Laterality: Right;  sfa  . REFRACTIVE SURGERY     Right Eye   Family History  Problem Relation Age of Onset  . Hypertension Mother   . Hyperlipidemia Mother   . Heart disease Mother    Social History   Tobacco Use  . Smoking status: Former Smoker    Packs/day: 1.00    Years: 15.00    Pack years: 15.00    Types: Cigarettes    Quit date: 07/17/2015    Years since quitting: 3.8  . Smokeless  tobacco: Never Used  Substance Use Topics  . Alcohol use: No   Marital Status: Married ROS  Review of Systems  Cardiovascular: Positive for claudication and dyspnea on exertion. Negative for chest pain and leg swelling.  Musculoskeletal: Positive for arthritis and back pain.  Gastrointestinal: Positive for heartburn. Negative for melena.   Objective   Vitals with BMI 05/20/2019 11/20/2018 08/12/2018  Height 5\' 2"  5\' 2"  5\' 2"   Weight 133 lbs 131 lbs 11 oz 133 lbs 2 oz  BMI 24.32 24.08 24.34  Systolic 129 138  120  Diastolic 71 72 66  Pulse 69 57 50     Physical Exam  Constitutional: He appears well-developed. No distress.  petite  Neck: No JVD present. No thyromegaly present.  Cardiovascular: Normal rate, regular rhythm, normal heart sounds and intact distal pulses. Exam reveals no gallop.  No murmur heard. Pulses:      Carotid pulses are 2+ on the right side and 2+ on the left side.      Femoral pulses are 2+ on the right side with bruit and 1+ on the left side with bruit.      Popliteal pulses are 1+ on the right side and 1+ on the left side.       Dorsalis pedis pulses are 0 on the right side and 1+ on the left side.       Posterior tibial pulses are 0 on the right side and 0 on the left side.  Loud bifemoral bruit present. Loss of hair. Warm extremity. No edema.  No JVD.   Pulmonary/Chest: Effort normal. He has no wheezes. He has no rales.  Barrel shaped chest with decreased breath sounds bilateral.   Abdominal: Soft. Bowel sounds are normal.  Neurological: He is alert.   Laboratory examination:   Recent Labs    10/30/18 0958  NA 142  K 3.9  CL 108*  CO2 23  GLUCOSE 95  BUN 13  CREATININE 0.83  CALCIUM 9.3  GFRNONAA 87  GFRAA 101   CrCl cannot be calculated (Patient's most recent lab result is older than the maximum 21 days allowed.).  CMP Latest Ref Rng & Units 10/30/2018 09/29/2017 09/29/2017  Glucose 65 - 99 mg/dL 95 11/01/2018) 10/01/2017)  BUN 8 - 27 mg/dL 13 16 17   Creatinine 0.76 - 1.27 mg/dL 10/01/2017 951(O) 841(Y)  Sodium 134 - 144 mmol/L 142 143 144  Potassium 3.5 - 5.2 mmol/L 3.9 3.6 3.5  Chloride 96 - 106 mmol/L 108(H) 115(H) 115(H)  CO2 20 - 29 mmol/L 23 - 20(L)  Calcium 8.6 - 10.2 mg/dL 9.3 - 8.9  Total Protein 6.0 - 8.5 g/dL 7.1 - 7.1  Total Bilirubin 0.0 - 1.2 mg/dL 0.3 - 0.7  Alkaline Phos 39 - 117 IU/L 72 - 49  AST 0 - 40 IU/L 18 - 22  ALT 0 - 44 IU/L 12 - 17   CBC Latest Ref Rng & Units 10/30/2018 09/29/2017 09/29/2017  WBC 3.4 - 10.8 x10E3/uL 6.1 - 14.1(H)    Hemoglobin 13.0 - 17.7 g/dL 0.10(X 11.9(L) 11.5(L)  Hematocrit 37.5 - 51.0 % 40.4 35.0(L) 35.4(L)  Platelets 150 - 450 x10E3/uL 234 - 224   Lipid Panel     Component Value Date/Time   CHOL 153 12/24/2018 1022   TRIG 90 12/24/2018 1022   HDL 57 12/24/2018 1022   LDLCALC 79 12/24/2018 1022   LDLDIRECT 105 (H) 10/30/2018 0958   HEMOGLOBIN A1C No results found for: HGBA1C, MPG TSH Recent Labs  10/30/18 0958  TSH 1.110    External Labs:  04/12/2019: HDL 57.000 mg 04/12/2019 LDL 75.000 mg 04/12/2019 Cholesterol, total 148.000 mg 04/12/2019 Triglycerides 81.000 mg 04/12/2019  Glucose 103. BUN 15.   Medications and allergies  No Known Allergies   Current Outpatient Medications  Medication Instructions  . acetaminophen (TYLENOL) 1,000 mg, Oral, Every 6 hours PRN  . amLODipine-benazepril (LOTREL) 10-40 MG per capsule 1 capsule, Oral, Daily  . aspirin EC 81 mg, Oral, Daily  . brimonidine (ALPHAGAN) 0.2 % ophthalmic solution 1 drop, Right Eye, 2 times daily  . clopidogrel (PLAVIX) 75 MG tablet TAKE 1 TABLET BY MOUTH  DAILY  . dorzolamide-timolol (COSOPT) 22.3-6.8 MG/ML ophthalmic solution 1 drop, Right Eye, 2 times daily  . ketorolac (ACULAR) 0.5 % ophthalmic solution 1 drop, Right Eye, 4 times daily  . latanoprost (XALATAN) 0.005 % ophthalmic solution 1 drop, Right Eye, Daily at bedtime  . nitroGLYCERIN (NITROSTAT) 0.4 MG SL tablet DISSOLVE ONE TABLET UNDER THE TONGUE EVERY 5 MINUTES AS NEEDED FOR CHEST PAIN.  DO NOT EXCEED A TOTAL OF 3 DOSES IN 15 MINUTES  . prednisoLONE acetate (PRED FORTE) 1 % ophthalmic suspension 1 drop, Both Eyes, Daily  . rosuvastatin (CRESTOR) 20 mg, Oral, Daily    Radiology:   CT Angio Chest 02/17/2016: 1. No evidence of aortic dissection. No evidence of aneurysmaldilatation. 2. No evidence of significant pulmonary embolus. 3. Scattered calcific atherosclerotic disease along the abdominal aorta and its branches, with likely mild to moderate luminal  narrowing along the external and internal iliac arteries bilaterally. 4. **An incidental finding of potential clinical significance has been found. 1.9 cm hypodense nodule at the right thyroid lobe. 5. Scattered coronary artery calcifications seen. 6. Minimal emphysema at the lung apices.  Lungs otherwise clear. 7. Small umbilical hernia, containing only fat. 8. Mild degenerative change along the lumbar spine.  Cardiac Studies:   Peripheral arteriogram 05/16/2015: Right SFA mid to distal 95% restenosis, 1.5 mm burr and 5x150 mm DCB. Stenosis reduced to 0-%. Atherectomy performed 04/26/2014 in the right peroneal artery is widely patent. TP trunk atherectomy widely patent. 2 vessel runoff in the form of peroneal and posterior tibial artery.  Angioplasty performed 03/15/2014 Left external iliac artery 9.0 x 60, 9.0 x 20 mm self-expanding stent. Left distal SFA and proximal popliteal and tibioperoneal trunk atherectomy with CSI 1.5 mm Crown site is widely patent. Left proximal SFA has a focal 80-90% stenosis. 2 vessel runoff in the form of peroneal and PT.  Exercise Myoview stress test 03/23/2018: 1. The patient performed treadmill exercise using Bruce protocol, completing 5:50 minutes. The patient completed an estimated workload of 7 METS, reaching 83% of the maximum predicted heart rate. Exercise capacity was low. Hemodynamic response was normal. No stress symptoms reported. No ischemic changes seen on stress electrocardiogram.  Marginally sub-maximal stress test. 2.  The overall quality of the study is good. There is no evidence of abnormal lung activity. Stress and rest SPECT images demonstrate homogeneous tracer distribution throughout the myocardium. Gated SPECT imaging reveals normal myocardial thickening and wall motion. The left ventricular ejection fraction was calculated 46%, although visually appears normal.    3. Low risk study. Marginally sub-maximal study. Clinical correlation recommended.    ABI 11/13/2018: This exam reveals moderately decreased perfusion of the right lower extremity, noted at the anterior tibial and post tibial artery level (ABI 0.73). This exam reveals moderately decreased perfusion of the left lower extremity, noted at the anterior tibial and post tibial artery   level (ABI 0.64).  Compared to the study done on 02/17/2018, no significant change, right ABI 0.72 and left ABI 0.48.  EKG   EKG 05/20/2019: Marked sinus bradycardia at rate of 51 bpm, otherwise normal EKG. no significant change from 08/12/2018. Previously heart rate was 45 bpm.  Assessment     ICD-10-CM   1. PVD (peripheral vascular disease) (HCC)  I73.9 EKG 12-Lead    PCV LOWER ARTERIAL (BILATERAL)    Basic metabolic panel    CBC  2. Essential hypertension, benign  I10   3. Pure hypercholesterolemia  E78.00   4. Gastroesophageal reflux disease without esophagitis  K21.9     No orders of the defined types were placed in this encounter.  There are no discontinued medications.   Recommendations:   Timothy Webster  is a 74 y.o.  AAM male  with PAD, hyperlipidemia, tobacco use disorder which he has quit in May 2017. He has left iliac artery stenting (2016) and left SFA and tibioperoneal trunk angioplasty and also right SFA angioplasty in 2016 and 2017 respectively and repeat angioplasty to his right SFA in 2019 again with atherectomy followed by drug coated balloon angioplasty. He has a residual high-grade stenosis in the left proximal SFA disease He also has asymptomatic marked S. Bradycardia.   Due to worsening symptoms of claudication, change in physical exam with reduced pulses bilaterally, I will repeat lower extremity arterial duplex and schedule him for peripheral arteriogram.  He already has established vascular disease and I would like to plan to see which lower extremity showed target first for angioplasty.  I reviewed his external labs, lipids are under excellent control.  Blood pressure  is also well controlled.  I will see him back after the angiography, I also called his wife and spoke to her over the telephone and she is in agreement.  They understand the risks associated with peripheral angiography including but not limited to renal failure, surgical revascularization need on a emergent basis, bleeding, infection but not limited to these.  With regard to mild heartburn that he has when he eats spicy food advised him to use omeprazole on a as needed basis.   Adrian Prows, MD, [facility] 05/20/2019, 10:28 AM [facility] Pager: [phone] Office: [phone]

## 2019-05-18 NOTE — H&P (View-Only) (Signed)
Primary Physician/Referring:  [name], MD  [name], male    DOB: 06-24-1945, 74 y.o.   MRN: [id number]  Chief Complaint  Patient presents with  . Chest Pain  . PAD  . Follow-up    6 month   HPI:    Timothy Webster  is a 74 y.o. AAM male  with PAD, hyperlipidemia, tobacco use disorder which he has quit in May 2017. He has left iliac artery stenting (2016) and left SFA and tibioperoneal trunk angioplasty and also right SFA angioplasty in 2016 and 2017 respectively and repeat angioplasty to his right SFA in 2019 again with atherectomy followed by drug coated balloon angioplasty. He has a residual high-grade stenosis in the left proximal SFA disease.    He presents here for follow-up of peripheral arterial disease, hyperlipidemia, he has reduced his physical activity due to pain in bilateral calves, unable to tell me which leg hurts more.  He has not noticed any bluish discoloration or rest pain.  With regard to chest pain, he has not used any sublingual nitroglycerin and he has not had any chest tightness although he does say occasionally he gets heartburn especially when he eats spicy food.  He also complains of pain in his right shoulder and also right elbow and request "something".  Started about 2-3 weeks ago.   Past Medical History:  Diagnosis Date  . Glaucoma 02/25/2018  . Hyperlipidemia   . Hypertension   . PAD (peripheral artery disease) (HCC)   . Sinus bradycardia on ECG 02/25/2018   EKG 10/12/2015: Marked sinus bradycardia at rate of 44 bpm, normal axis. No evidence of ischemia otherwise normal EKG. No significant change from EKG 04/19/2015: Normal sinus rhythm at rate of 52 bpm, EKG 12/24/2013: Marked sinus bradycardia at the rate of 39 bpm.  . Tobacco use disorder, severe, dependence 02/25/2018   Past Surgical History:  Procedure Laterality Date  . INSERTION OF ILIAC STENT  03/15/2014   Procedure: INSERTION OF ILIAC STENT;  Surgeon: Yates Decamp, MD;   Location: [facility] CATH LAB;  Service: Cardiovascular;;  LEIA   . LOWER EXTREMITY ANGIOGRAM N/A 03/15/2014   Procedure: LOWER EXTREMITY ANGIOGRAM;  Surgeon: Yates Decamp, MD;  Location: [facility] CATH LAB;  Service: Cardiovascular;  Laterality: N/A;  . LOWER EXTREMITY ANGIOGRAM Right 04/26/2014   Procedure: LOWER EXTREMITY ANGIOGRAM;  Surgeon: Yates Decamp, MD;  Location: [facility] CATH LAB;  Service: Cardiovascular;  Laterality: Right;  . LOWER EXTREMITY ANGIOGRAPHY Bilateral 01/28/2017   Procedure: LOWER EXTREMITY ANGIOGRAPHY;  Surgeon: Yates Decamp, MD;  Location: [facility] INVASIVE CV LAB;  Service: Cardiovascular;  Laterality: Bilateral;  . PERIPHERAL VASCULAR CATHETERIZATION Bilateral 05/16/2015   Procedure: Lower Extremity Angiography;  Surgeon: Yates Decamp, MD;  Location: [facility] INVASIVE CV LAB;  Service: Cardiovascular;  Laterality: Bilateral;  . PERIPHERAL VASCULAR CATHETERIZATION Right 05/16/2015   Procedure: Peripheral Vascular Atherectomy;  Surgeon: Yates Decamp, MD;  Location: [facility] INVASIVE CV LAB;  Service: Cardiovascular;  Laterality: Right;  sfa  . REFRACTIVE SURGERY     Right Eye   Family History  Problem Relation Age of Onset  . Hypertension Mother   . Hyperlipidemia Mother   . Heart disease Mother    Social History   Tobacco Use  . Smoking status: Former Smoker    Packs/day: 1.00    Years: 15.00    Pack years: 15.00    Types: Cigarettes    Quit date: 07/17/2015    Years since quitting: 3.8  . Smokeless  tobacco: Never Used  Substance Use Topics  . Alcohol use: No   Marital Status: Married ROS  Review of Systems  Cardiovascular: Positive for claudication and dyspnea on exertion. Negative for chest pain and leg swelling.  Musculoskeletal: Positive for arthritis and back pain.  Gastrointestinal: Positive for heartburn. Negative for melena.   Objective   Vitals with BMI 05/20/2019 11/20/2018 08/12/2018  Height 5\' 2"  5\' 2"  5\' 2"   Weight 133 lbs 131 lbs 11 oz 133 lbs 2 oz  BMI 24.32 24.08 24.34  Systolic 129 138  120  Diastolic 71 72 66  Pulse 69 57 50     Physical Exam  Constitutional: He appears well-developed. No distress.  petite  Neck: No JVD present. No thyromegaly present.  Cardiovascular: Normal rate, regular rhythm, normal heart sounds and intact distal pulses. Exam reveals no gallop.  No murmur heard. Pulses:      Carotid pulses are 2+ on the right side and 2+ on the left side.      Femoral pulses are 2+ on the right side with bruit and 1+ on the left side with bruit.      Popliteal pulses are 1+ on the right side and 1+ on the left side.       Dorsalis pedis pulses are 0 on the right side and 1+ on the left side.       Posterior tibial pulses are 0 on the right side and 0 on the left side.  Loud bifemoral bruit present. Loss of hair. Warm extremity. No edema.  No JVD.   Pulmonary/Chest: Effort normal. He has no wheezes. He has no rales.  Barrel shaped chest with decreased breath sounds bilateral.   Abdominal: Soft. Bowel sounds are normal.  Neurological: He is alert.   Laboratory examination:   Recent Labs    10/30/18 0958  NA 142  K 3.9  CL 108*  CO2 23  GLUCOSE 95  BUN 13  CREATININE 0.83  CALCIUM 9.3  GFRNONAA 87  GFRAA 101   CrCl cannot be calculated (Patient's most recent lab result is older than the maximum 21 days allowed.).  CMP Latest Ref Rng & Units 10/30/2018 09/29/2017 09/29/2017  Glucose 65 - 99 mg/dL 95 11/01/2018) 10/01/2017)  BUN 8 - 27 mg/dL 13 16 17   Creatinine 0.76 - 1.27 mg/dL 10/01/2017 951(O) 841(Y)  Sodium 134 - 144 mmol/L 142 143 144  Potassium 3.5 - 5.2 mmol/L 3.9 3.6 3.5  Chloride 96 - 106 mmol/L 108(H) 115(H) 115(H)  CO2 20 - 29 mmol/L 23 - 20(L)  Calcium 8.6 - 10.2 mg/dL 9.3 - 8.9  Total Protein 6.0 - 8.5 g/dL 7.1 - 7.1  Total Bilirubin 0.0 - 1.2 mg/dL 0.3 - 0.7  Alkaline Phos 39 - 117 IU/L 72 - 49  AST 0 - 40 IU/L 18 - 22  ALT 0 - 44 IU/L 12 - 17   CBC Latest Ref Rng & Units 10/30/2018 09/29/2017 09/29/2017  WBC 3.4 - 10.8 x10E3/uL 6.1 - 14.1(H)    Hemoglobin 13.0 - 17.7 g/dL 0.10(X 11.9(L) 11.5(L)  Hematocrit 37.5 - 51.0 % 40.4 35.0(L) 35.4(L)  Platelets 150 - 450 x10E3/uL 234 - 224   Lipid Panel     Component Value Date/Time   CHOL 153 12/24/2018 1022   TRIG 90 12/24/2018 1022   HDL 57 12/24/2018 1022   LDLCALC 79 12/24/2018 1022   LDLDIRECT 105 (H) 10/30/2018 0958   HEMOGLOBIN A1C No results found for: HGBA1C, MPG TSH Recent Labs  10/30/18 0958  TSH 1.110    External Labs:  04/12/2019: HDL 57.000 mg 04/12/2019 LDL 75.000 mg 04/12/2019 Cholesterol, total 148.000 mg 04/12/2019 Triglycerides 81.000 mg 04/12/2019  Glucose 103. BUN 15.   Medications and allergies  No Known Allergies   Current Outpatient Medications  Medication Instructions  . acetaminophen (TYLENOL) 1,000 mg, Oral, Every 6 hours PRN  . amLODipine-benazepril (LOTREL) 10-40 MG per capsule 1 capsule, Oral, Daily  . aspirin EC 81 mg, Oral, Daily  . brimonidine (ALPHAGAN) 0.2 % ophthalmic solution 1 drop, Right Eye, 2 times daily  . clopidogrel (PLAVIX) 75 MG tablet TAKE 1 TABLET BY MOUTH  DAILY  . dorzolamide-timolol (COSOPT) 22.3-6.8 MG/ML ophthalmic solution 1 drop, Right Eye, 2 times daily  . ketorolac (ACULAR) 0.5 % ophthalmic solution 1 drop, Right Eye, 4 times daily  . latanoprost (XALATAN) 0.005 % ophthalmic solution 1 drop, Right Eye, Daily at bedtime  . nitroGLYCERIN (NITROSTAT) 0.4 MG SL tablet DISSOLVE ONE TABLET UNDER THE TONGUE EVERY 5 MINUTES AS NEEDED FOR CHEST PAIN.  DO NOT EXCEED A TOTAL OF 3 DOSES IN 15 MINUTES  . prednisoLONE acetate (PRED FORTE) 1 % ophthalmic suspension 1 drop, Both Eyes, Daily  . rosuvastatin (CRESTOR) 20 mg, Oral, Daily    Radiology:   CT Angio Chest 02/17/2016: 1. No evidence of aortic dissection. No evidence of aneurysmaldilatation. 2. No evidence of significant pulmonary embolus. 3. Scattered calcific atherosclerotic disease along the abdominal aorta and its branches, with likely mild to moderate luminal  narrowing along the external and internal iliac arteries bilaterally. 4. **An incidental finding of potential clinical significance has been found. 1.9 cm hypodense nodule at the right thyroid lobe. 5. Scattered coronary artery calcifications seen. 6. Minimal emphysema at the lung apices.  Lungs otherwise clear. 7. Small umbilical hernia, containing only fat. 8. Mild degenerative change along the lumbar spine.  Cardiac Studies:   Peripheral arteriogram 05/16/2015: Right SFA mid to distal 95% restenosis, 1.5 mm burr and 5x150 mm DCB. Stenosis reduced to 0-%. Atherectomy performed 04/26/2014 in the right peroneal artery is widely patent. TP trunk atherectomy widely patent. 2 vessel runoff in the form of peroneal and posterior tibial artery.  Angioplasty performed 03/15/2014 Left external iliac artery 9.0 x 60, 9.0 x 20 mm self-expanding stent. Left distal SFA and proximal popliteal and tibioperoneal trunk atherectomy with CSI 1.5 mm Crown site is widely patent. Left proximal SFA has a focal 80-90% stenosis. 2 vessel runoff in the form of peroneal and PT.  Exercise Myoview stress test 03/23/2018: 1. The patient performed treadmill exercise using Bruce protocol, completing 5:50 minutes. The patient completed an estimated workload of 7 METS, reaching 83% of the maximum predicted heart rate. Exercise capacity was low. Hemodynamic response was normal. No stress symptoms reported. No ischemic changes seen on stress electrocardiogram.  Marginally sub-maximal stress test. 2.  The overall quality of the study is good. There is no evidence of abnormal lung activity. Stress and rest SPECT images demonstrate homogeneous tracer distribution throughout the myocardium. Gated SPECT imaging reveals normal myocardial thickening and wall motion. The left ventricular ejection fraction was calculated 46%, although visually appears normal.    3. Low risk study. Marginally sub-maximal study. Clinical correlation recommended.    ABI 11/13/2018: This exam reveals moderately decreased perfusion of the right lower extremity, noted at the anterior tibial and post tibial artery level (ABI 0.73). This exam reveals moderately decreased perfusion of the left lower extremity, noted at the anterior tibial and post tibial artery  level (ABI 0.64).  Compared to the study done on 02/17/2018, no significant change, right ABI 0.72 and left ABI 0.48.  EKG   EKG 05/20/2019: Marked sinus bradycardia at rate of 51 bpm, otherwise normal EKG. no significant change from 08/12/2018. Previously heart rate was 45 bpm.  Assessment     ICD-10-CM   1. PVD (peripheral vascular disease) (HCC)  I73.9 EKG 12-Lead    PCV LOWER ARTERIAL (BILATERAL)    Basic metabolic panel    CBC  2. Essential hypertension, benign  I10   3. Pure hypercholesterolemia  E78.00   4. Gastroesophageal reflux disease without esophagitis  K21.9     No orders of the defined types were placed in this encounter.  There are no discontinued medications.   Recommendations:   Timothy Webster  is a 74 y.o.  AAM male  with PAD, hyperlipidemia, tobacco use disorder which he has quit in May 2017. He has left iliac artery stenting (2016) and left SFA and tibioperoneal trunk angioplasty and also right SFA angioplasty in 2016 and 2017 respectively and repeat angioplasty to his right SFA in 2019 again with atherectomy followed by drug coated balloon angioplasty. He has a residual high-grade stenosis in the left proximal SFA disease He also has asymptomatic marked S. Bradycardia.   Due to worsening symptoms of claudication, change in physical exam with reduced pulses bilaterally, I will repeat lower extremity arterial duplex and schedule him for peripheral arteriogram.  He already has established vascular disease and I would like to plan to see which lower extremity showed target first for angioplasty.  I reviewed his external labs, lipids are under excellent control.  Blood pressure  is also well controlled.  I will see him back after the angiography, I also called his wife and spoke to her over the telephone and she is in agreement.  They understand the risks associated with peripheral angiography including but not limited to renal failure, surgical revascularization need on a emergent basis, bleeding, infection but not limited to these.  With regard to mild heartburn that he has when he eats spicy food advised him to use omeprazole on a as needed basis.   Adrian Prows, MD, [facility] 05/20/2019, 10:28 AM [facility] [place] Pager: [phone] Office: [phone]

## 2019-05-19 ENCOUNTER — Ambulatory Visit (HOSPITAL_COMMUNITY)
Admission: RE | Admit: 2019-05-19 | Discharge: 2019-05-19 | Disposition: A | Discharge status: Home / Self Care | Payer: Medicare Other | Source: Ambulatory Visit | Attending: Family Medicine | Admitting: Family Medicine

## 2019-05-19 ENCOUNTER — Other Ambulatory Visit: Payer: Self-pay

## 2019-05-19 DIAGNOSIS — Z87891 Personal history of nicotine dependence: Secondary | ICD-10-CM

## 2019-05-19 DIAGNOSIS — F17209 Nicotine dependence, unspecified, with unspecified nicotine-induced disorders: Secondary | ICD-10-CM | POA: Insufficient documentation

## 2019-05-20 ENCOUNTER — Encounter: Payer: Self-pay | Admitting: Cardiology

## 2019-05-20 ENCOUNTER — Ambulatory Visit: Payer: Medicare Other | Admitting: Cardiology

## 2019-05-20 VITALS — BP 129/71 | HR 69 | Temp 98.4°F | Resp 14 | Ht 62.0 in | Wt 133.0 lb

## 2019-05-20 DIAGNOSIS — K219 Gastro-esophageal reflux disease without esophagitis: Secondary | ICD-10-CM

## 2019-05-20 DIAGNOSIS — I10 Essential (primary) hypertension: Secondary | ICD-10-CM

## 2019-05-20 DIAGNOSIS — E7800 Pure hypercholesterolemia, unspecified: Secondary | ICD-10-CM

## 2019-05-20 DIAGNOSIS — I739 Peripheral vascular disease, unspecified: Secondary | ICD-10-CM | POA: Diagnosis not present

## 2019-05-26 ENCOUNTER — Other Ambulatory Visit: Payer: Self-pay

## 2019-05-26 ENCOUNTER — Ambulatory Visit: Payer: Medicare Other

## 2019-05-26 DIAGNOSIS — I739 Peripheral vascular disease, unspecified: Secondary | ICD-10-CM

## 2019-06-04 ENCOUNTER — Other Ambulatory Visit (HOSPITAL_COMMUNITY): Payer: Self-pay

## 2019-06-11 ENCOUNTER — Other Ambulatory Visit: Payer: Self-pay

## 2019-06-11 ENCOUNTER — Other Ambulatory Visit (HOSPITAL_COMMUNITY)
Admission: RE | Admit: 2019-06-11 | Discharge: 2019-06-11 | Disposition: A | Discharge status: Home / Self Care | Payer: Medicare Other | Source: Ambulatory Visit | Attending: Cardiology | Admitting: Cardiology

## 2019-06-11 ENCOUNTER — Other Ambulatory Visit (HOSPITAL_COMMUNITY): Payer: Self-pay

## 2019-06-11 DIAGNOSIS — Z20822 Contact with and (suspected) exposure to covid-19: Secondary | ICD-10-CM | POA: Diagnosis not present

## 2019-06-11 DIAGNOSIS — Z01812 Encounter for preprocedural laboratory examination: Secondary | ICD-10-CM | POA: Diagnosis not present

## 2019-06-11 DIAGNOSIS — I739 Peripheral vascular disease, unspecified: Secondary | ICD-10-CM | POA: Diagnosis not present

## 2019-06-11 LAB — SARS CORONAVIRUS 2 (TAT 6-24 HRS): SARS Coronavirus 2: NEGATIVE

## 2019-06-12 LAB — BASIC METABOLIC PANEL
BUN/Creatinine Ratio: 17 (ref 10–24)
BUN: 18 mg/dL (ref 8–27)
CO2: 21 mmol/L (ref 20–29)
Calcium: 9.9 mg/dL (ref 8.6–10.2)
Chloride: 104 mmol/L (ref 96–106)
Creatinine, Ser: 1.07 mg/dL (ref 0.76–1.27)
GFR calc Af Amer: 79 mL/min/{1.73_m2} (ref >59)
GFR calc non Af Amer: 68 mL/min/{1.73_m2} (ref >59)
Glucose: 91 mg/dL (ref 65–99)
Potassium: 4.8 mmol/L (ref 3.5–5.2)
Sodium: 137 mmol/L (ref 134–144)

## 2019-06-12 LAB — CBC
Hematocrit: 41.7 % (ref 37.5–51.0)
Hemoglobin: 13.9 g/dL (ref 13.0–17.7)
MCH: 31.1 pg (ref 26.6–33.0)
MCHC: 33.3 g/dL (ref 31.5–35.7)
MCV: 93 fL (ref 79–97)
Platelets: 224 10*3/uL (ref 150–450)
RBC: 4.47 x10E6/uL (ref 4.14–5.80)
RDW: 13.1 % (ref 11.6–15.4)
WBC: 5.3 10*3/uL (ref 3.4–10.8)

## 2019-06-15 ENCOUNTER — Other Ambulatory Visit: Payer: Self-pay

## 2019-06-15 ENCOUNTER — Ambulatory Visit (HOSPITAL_COMMUNITY)
Admission: RE | Admit: 2019-06-15 | Discharge: 2019-06-15 | Disposition: A | Discharge status: Home / Self Care | Payer: Medicare Other | Attending: Cardiology | Admitting: Cardiology

## 2019-06-15 ENCOUNTER — Encounter (HOSPITAL_COMMUNITY)
Admission: RE | Disposition: A | Discharge status: Home / Self Care | Payer: Self-pay | Source: Home / Self Care | Attending: Cardiology

## 2019-06-15 DIAGNOSIS — Z95828 Presence of other vascular implants and grafts: Secondary | ICD-10-CM | POA: Insufficient documentation

## 2019-06-15 DIAGNOSIS — Z87891 Personal history of nicotine dependence: Secondary | ICD-10-CM | POA: Insufficient documentation

## 2019-06-15 DIAGNOSIS — E7800 Pure hypercholesterolemia, unspecified: Secondary | ICD-10-CM | POA: Insufficient documentation

## 2019-06-15 DIAGNOSIS — E785 Hyperlipidemia, unspecified: Secondary | ICD-10-CM | POA: Insufficient documentation

## 2019-06-15 DIAGNOSIS — I70213 Atherosclerosis of native arteries of extremities with intermittent claudication, bilateral legs: Secondary | ICD-10-CM | POA: Insufficient documentation

## 2019-06-15 DIAGNOSIS — I739 Peripheral vascular disease, unspecified: Secondary | ICD-10-CM | POA: Diagnosis not present

## 2019-06-15 DIAGNOSIS — K219 Gastro-esophageal reflux disease without esophagitis: Secondary | ICD-10-CM | POA: Diagnosis not present

## 2019-06-15 DIAGNOSIS — Z79899 Other long term (current) drug therapy: Secondary | ICD-10-CM | POA: Insufficient documentation

## 2019-06-15 DIAGNOSIS — I10 Essential (primary) hypertension: Secondary | ICD-10-CM | POA: Diagnosis not present

## 2019-06-15 DIAGNOSIS — Z7982 Long term (current) use of aspirin: Secondary | ICD-10-CM | POA: Diagnosis not present

## 2019-06-15 DIAGNOSIS — Z8249 Family history of ischemic heart disease and other diseases of the circulatory system: Secondary | ICD-10-CM | POA: Insufficient documentation

## 2019-06-15 DIAGNOSIS — H409 Unspecified glaucoma: Secondary | ICD-10-CM | POA: Diagnosis not present

## 2019-06-15 HISTORY — PX: PERIPHERAL VASCULAR BALLOON ANGIOPLASTY: CATH118281

## 2019-06-15 HISTORY — PX: LOWER EXTREMITY ANGIOGRAPHY: CATH118251

## 2019-06-15 HISTORY — PX: PERIPHERAL VASCULAR INTERVENTION: CATH118257

## 2019-06-15 LAB — POCT ACTIVATED CLOTTING TIME
Activated Clotting Time: 235 seconds
Activated Clotting Time: 268 seconds

## 2019-06-15 SURGERY — LOWER EXTREMITY ANGIOGRAPHY
Anesthesia: LOCAL

## 2019-06-15 MED ORDER — LIDOCAINE HCL (PF) 1 % IJ SOLN
INTRAMUSCULAR | Status: AC
  Filled 2019-06-15: qty 30

## 2019-06-15 MED ORDER — CLOPIDOGREL BISULFATE 75 MG PO TABS
75.0000 mg | ORAL_TABLET | ORAL | Status: AC
  Administered 2019-06-15: 75 mg via ORAL
  Filled 2019-06-15: qty 1

## 2019-06-15 MED ORDER — SODIUM CHLORIDE 0.9 % WEIGHT BASED INFUSION: 1.0000 mL/kg/h | INTRAVENOUS | Status: DC

## 2019-06-15 MED ORDER — SODIUM CHLORIDE 0.9% FLUSH: 3.0000 mL | INTRAVENOUS | Status: DC | PRN

## 2019-06-15 MED ORDER — IODIXANOL 320 MG/ML IV SOLN
INTRAVENOUS | Status: DC | PRN
  Administered 2019-06-15: 140 mL via INTRA_ARTERIAL

## 2019-06-15 MED ORDER — ONDANSETRON HCL 4 MG/2ML IJ SOLN: 4.0000 mg | Freq: Four times a day (QID) | INTRAMUSCULAR | Status: DC | PRN

## 2019-06-15 MED ORDER — FENTANYL CITRATE (PF) 100 MCG/2ML IJ SOLN: 25.0000 ug | INTRAMUSCULAR | Status: DC | PRN

## 2019-06-15 MED ORDER — SODIUM CHLORIDE 0.9% FLUSH: 3.0000 mL | Freq: Two times a day (BID) | INTRAVENOUS | Status: DC

## 2019-06-15 MED ORDER — MIDAZOLAM HCL 2 MG/2ML IJ SOLN
INTRAMUSCULAR | Status: AC
  Filled 2019-06-15: qty 2

## 2019-06-15 MED ORDER — HEPARIN (PORCINE) IN NACL 1000-0.9 UT/500ML-% IV SOLN
INTRAVENOUS | Status: DC | PRN
  Administered 2019-06-15 (×2): 500 mL

## 2019-06-15 MED ORDER — ACETAMINOPHEN 325 MG PO TABS: 650.0000 mg | ORAL_TABLET | ORAL | Status: DC | PRN

## 2019-06-15 MED ORDER — HEPARIN SODIUM (PORCINE) 1000 UNIT/ML IJ SOLN
INTRAMUSCULAR | Status: AC
  Filled 2019-06-15: qty 1

## 2019-06-15 MED ORDER — FENTANYL CITRATE (PF) 100 MCG/2ML IJ SOLN
INTRAMUSCULAR | Status: AC
  Filled 2019-06-15: qty 2

## 2019-06-15 MED ORDER — SODIUM CHLORIDE 0.9 % IV SOLN: 250.0000 mL | INTRAVENOUS | Status: DC | PRN

## 2019-06-15 MED ORDER — SODIUM CHLORIDE 0.9 % WEIGHT BASED INFUSION
177.0000 mL/h | INTRAVENOUS | Status: AC
  Administered 2019-06-15: 3 mL/kg/h via INTRAVENOUS

## 2019-06-15 MED ORDER — ASPIRIN 81 MG PO CHEW
81.0000 mg | CHEWABLE_TABLET | ORAL | Status: AC
  Administered 2019-06-15: 81 mg via ORAL
  Filled 2019-06-15: qty 1

## 2019-06-15 MED ORDER — FENTANYL CITRATE (PF) 100 MCG/2ML IJ SOLN
INTRAMUSCULAR | Status: AC
  Administered 2019-06-15: 25 ug via INTRAVENOUS
  Filled 2019-06-15: qty 2

## 2019-06-15 MED ORDER — HEPARIN (PORCINE) IN NACL 1000-0.9 UT/500ML-% IV SOLN
INTRAVENOUS | Status: AC
  Filled 2019-06-15: qty 1000

## 2019-06-15 MED ORDER — FENTANYL CITRATE (PF) 100 MCG/2ML IJ SOLN
INTRAMUSCULAR | Status: DC | PRN
  Administered 2019-06-15: 25 ug via INTRAVENOUS

## 2019-06-15 MED ORDER — LIDOCAINE HCL (PF) 1 % IJ SOLN
INTRAMUSCULAR | Status: DC | PRN
  Administered 2019-06-15: 16 mL via INTRADERMAL

## 2019-06-15 MED ORDER — MIDAZOLAM HCL 2 MG/2ML IJ SOLN
INTRAMUSCULAR | Status: DC | PRN
  Administered 2019-06-15: 2 mg via INTRAVENOUS

## 2019-06-15 MED ORDER — SODIUM CHLORIDE 0.9 % WEIGHT BASED INFUSION: 59.0000 mL/h | INTRAVENOUS | Status: DC

## 2019-06-15 MED ORDER — HEPARIN SODIUM (PORCINE) 1000 UNIT/ML IJ SOLN
INTRAMUSCULAR | Status: DC | PRN
  Administered 2019-06-15: 2000 [IU] via INTRAVENOUS
  Administered 2019-06-15: 6000 [IU] via INTRAVENOUS
  Administered 2019-06-15: 2000 [IU] via INTRAVENOUS

## 2019-06-15 SURGICAL SUPPLY — 27 items
BALLN CHOCOLATE 5.0X40X120 (BALLOONS) ×3
BALLN MUSTANG 5.0X40 135 (BALLOONS) ×3
BALLN MUSTANG 8.0X40 75 (BALLOONS) ×3
BALLOON CHOCOLATE 5.0X40X120 (BALLOONS) ×2 IMPLANT
BALLOON MUSTANG 5.0X40 135 (BALLOONS) ×2 IMPLANT
BALLOON MUSTANG 8.0X40 75 (BALLOONS) ×2 IMPLANT
CATH OMNI FLUSH 5F 65CM (CATHETERS) ×3 IMPLANT
CATH STRAIGHT 5FR 65CM (CATHETERS) ×3 IMPLANT
CLOSURE MYNX CONTROL 6F/7F (Vascular Products) ×3 IMPLANT
DEVICE TORQUE .025-.038 (MISCELLANEOUS) ×3 IMPLANT
GUIDEWIRE ANGLED .035X150CM (WIRE) ×3 IMPLANT
GUIDEWIRE ZILIENT 6G 014 (WIRE) ×3 IMPLANT
KIT ENCORE 26 ADVANTAGE (KITS) ×3 IMPLANT
KIT MICROPUNCTURE NIT STIFF (SHEATH) ×3 IMPLANT
KIT PV (KITS) ×3 IMPLANT
SHEATH PINNACLE 5F 10CM (SHEATH) ×3 IMPLANT
SHEATH PINNACLE 6F 10CM (SHEATH) ×3 IMPLANT
SHEATH PINNACLE ST 6F 45CM (SHEATH) ×3 IMPLANT
SHEATH PROBE COVER 6X72 (BAG) ×3 IMPLANT
STENT ABSOLUTE PRO 9X60X135 (Permanent Stent) ×3 IMPLANT
SYR MEDRAD MARK 7 150ML (SYRINGE) ×3 IMPLANT
TAPE VIPERTRACK RADIOPAQ (MISCELLANEOUS) ×2 IMPLANT
TAPE VIPERTRACK RADIOPAQUE (MISCELLANEOUS) ×2
TRANSDUCER W/STOPCOCK (MISCELLANEOUS) ×3 IMPLANT
TRAY PV CATH (CUSTOM PROCEDURE TRAY) ×3 IMPLANT
WIRE BENTSON .035X145CM (WIRE) ×3 IMPLANT
WIRE ROSEN-J .035X260CM (WIRE) ×3 IMPLANT

## 2019-06-15 NOTE — Interval H&P Note (Signed)
History and Physical Interval Note:  06/15/2019 7:41 AM  Timothy Webster  has presented today for surgery, with the diagnosis of pvd.  The various methods of treatment have been discussed with the patient and family. After consideration of risks, benefits and other options for treatment, the patient has consented to  Procedure(s): LOWER EXTREMITY ANGIOGRAPHY (N/A) and possible angioplasty  as a surgical intervention.  The patient's history has been reviewed, patient examined, no change in status, stable for surgery.  I have reviewed the patient's chart and labs.  Questions were answered to the patient's satisfaction.     Yates Decamp

## 2019-06-15 NOTE — Progress Notes (Addendum)
Complaints of "severe" Pain to lt calf area. Swelling noted. PT pulse remains present by doppler/ Dr. Jacinto Halim Notified. Per MD instruction a BP cuff has been placed around the lt calf and inflated to apply pressure. BP cuff infalted to Hg. @ 1130

## 2019-06-15 NOTE — Progress Notes (Signed)
Removed cuff per MD instructions to remove cuff at 1300. Calf is soft and measuring 12.5 inches. Back of knee is sill taut and slightly swollen.

## 2019-06-15 NOTE — Discharge Instructions (Signed)
    Discharge Instructions  Lower Extremity Angiogram; Angioplasty/Stenting  Please refer to the following instructions for your post-procedure care. Your surgeon or physician assistant will discuss any changes with you.  Activity  Avoid lifting more than 8 pounds (1 gallons of milk) for 72 hours (3 days) after your procedure. You may walk as much as you can tolerate. It's OK to drive after 72 hours.  Bathing/Showering  You may shower the day after your procedure. If you have a bandage, you may remove it at 24- 48 hours. Clean your incision site with mild soap and water. Pat the area dry with a clean towel.  Diet  DRINK PLENTY OF FLUIDS FOR THE NEXT 2-3 DAYS.  Resume your pre-procedure diet. There are no special food restrictions following this procedure. All patients with peripheral vascular disease should follow a low fat/low cholesterol diet. In order to heal from your surgery, it is CRITICAL to get adequate nutrition. Your body requires vitamins, minerals, and protein. Vegetables are the best source of vitamins and minerals. Vegetables also provide the perfect balance of protein. Processed food has little nutritional value, so try to avoid this.  Medications  Resume taking all of your medications unless your doctor tells you not to. If your incision is causing pain, you may take over-the-counter pain relievers such as acetaminophen (Tylenol)  Follow Up  Follow up will be arranged at the time of your procedure. You may have an office visit scheduled or may be scheduled for surgery. Ask your surgeon if you have any questions.  Please call us immediately for any of the following conditions: .Severe or worsening pain your legs or feet at rest or with walking. .Increased pain, redness, drainage at your groin puncture site. .Fever of 101 degrees or higher. .If you have any mild or slow bleeding from your puncture site: lie down, apply firm constant pressure over the area with a piece  of gauze or a clean wash cloth for 30 minutes- no peeking!, call 911 right away if you are still bleeding after 30 minutes, or if the bleeding is heavy and unmanageable.  Reduce your risk factors of vascular disease:  Stop smoking. If you would like help call [facility] at 1-800-QUIT-NOW (585-031-1243) or [place] at (714)717-8487. Manage your cholesterol Maintain a desired weight Control your diabetes Keep your blood pressure down  If you have any questions, please call Dr. Verl Dicker office

## 2019-06-15 NOTE — Progress Notes (Signed)
Dr Jacinto Halim came to see pt and said after 30 minutes, pt could get up and ambulate if leg was unchanged. Leg marked by Liborio Nixon, RN and is still 12.5 inches. Per Dr Jacinto Halim, pt is good to D/C.

## 2019-06-17 ENCOUNTER — Ambulatory Visit: Payer: Medicare Other | Admitting: Cardiology

## 2019-06-23 ENCOUNTER — Ambulatory Visit: Payer: Medicare Other | Admitting: Cardiology

## 2019-06-23 ENCOUNTER — Other Ambulatory Visit: Payer: Self-pay

## 2019-06-23 ENCOUNTER — Encounter: Payer: Self-pay | Admitting: Cardiology

## 2019-06-23 VITALS — BP 116/59 | HR 44 | Ht 63.0 in | Wt 130.0 lb

## 2019-06-23 DIAGNOSIS — I739 Peripheral vascular disease, unspecified: Secondary | ICD-10-CM | POA: Diagnosis not present

## 2019-06-23 DIAGNOSIS — I10 Essential (primary) hypertension: Secondary | ICD-10-CM

## 2019-06-23 DIAGNOSIS — R001 Bradycardia, unspecified: Secondary | ICD-10-CM

## 2019-06-23 NOTE — Progress Notes (Signed)
Primary Physician/Referring:  [name], MD  [name], male    DOB: Mar 13, 1945, 74 y.o.   MRN: [id number]  Chief Complaint  Patient presents with  . Hospitalization Follow-up    cath   HPI:    Timothy Webster  is a 74 y.o. AAM male  with PAD, hyperlipidemia, tobacco use disorder which he has quit in May 2017. He has left iliac artery stenting (2016) and left SFA and tibioperoneal trunk angioplasty and also right SFA angioplasty in 2016 and 2017 respectively and repeat angioplasty to his right SFA in 2019 again with atherectomy followed by drug coated balloon angioplasty. He has a residual high-grade stenosis in the left proximal SFA disease.  He underwent repeat peripheral arteriogram on 06/15/2019 and successful chocolate balloon angioplasty of the left distal SFA and proximal popliteal artery but had a microperforation in the peripheral popliteal branch which is small.  He developed a small hematoma in his left leg but was discharged uneventfully.  He now presents for follow-up, has noticed swelling in his left leg and very mild discomfort.  Is able to walk with mild discomfort.  He still has persistent right leg claudication.  He has residual right SFA disease.  Otherwise from cardiac standpoint he remains stable without angina pectoris.  Wife is present at the bedside. Past Medical History:  Diagnosis Date  . Glaucoma 02/25/2018  . Hyperlipidemia   . Hypertension   . PAD (peripheral artery disease) (HCC)   . Sinus bradycardia on ECG 02/25/2018   EKG 10/12/2015: Marked sinus bradycardia at rate of 44 bpm, normal axis. No evidence of ischemia otherwise normal EKG. No significant change from EKG 04/19/2015: Normal sinus rhythm at rate of 52 bpm, EKG 12/24/2013: Marked sinus bradycardia at the rate of 39 bpm.  . Tobacco use disorder, severe, dependence 02/25/2018   Past Surgical History:  Procedure Laterality Date  . INSERTION OF ILIAC STENT  03/15/2014   Procedure:  INSERTION OF ILIAC STENT;  Surgeon: Yates Decamp, MD;  Location: [facility] CATH LAB;  Service: Cardiovascular;;  LEIA   . LOWER EXTREMITY ANGIOGRAM N/A 03/15/2014   Procedure: LOWER EXTREMITY ANGIOGRAM;  Surgeon: Yates Decamp, MD;  Location: [facility] CATH LAB;  Service: Cardiovascular;  Laterality: N/A;  . LOWER EXTREMITY ANGIOGRAM Right 04/26/2014   Procedure: LOWER EXTREMITY ANGIOGRAM;  Surgeon: Yates Decamp, MD;  Location: [facility] CATH LAB;  Service: Cardiovascular;  Laterality: Right;  . LOWER EXTREMITY ANGIOGRAPHY Bilateral 01/28/2017   Procedure: LOWER EXTREMITY ANGIOGRAPHY;  Surgeon: Yates Decamp, MD;  Location: [facility] INVASIVE CV LAB;  Service: Cardiovascular;  Laterality: Bilateral;  . LOWER EXTREMITY ANGIOGRAPHY N/A 06/15/2019   Procedure: LOWER EXTREMITY ANGIOGRAPHY;  Surgeon: Yates Decamp, MD;  Location: [facility] INVASIVE CV LAB;  Service: Cardiovascular;  Laterality: N/A;  . PERIPHERAL VASCULAR BALLOON ANGIOPLASTY  06/15/2019   Procedure: PERIPHERAL VASCULAR BALLOON ANGIOPLASTY;  Surgeon: Yates Decamp, MD;  Location: [facility] INVASIVE CV LAB;  Service: Cardiovascular;;  Left SFA  . PERIPHERAL VASCULAR CATHETERIZATION Bilateral 05/16/2015   Procedure: Lower Extremity Angiography;  Surgeon: Yates Decamp, MD;  Location: [facility] INVASIVE CV LAB;  Service: Cardiovascular;  Laterality: Bilateral;  . PERIPHERAL VASCULAR CATHETERIZATION Right 05/16/2015   Procedure: Peripheral Vascular Atherectomy;  Surgeon: Yates Decamp, MD;  Location: [facility] INVASIVE CV LAB;  Service: Cardiovascular;  Laterality: Right;  sfa  . PERIPHERAL VASCULAR INTERVENTION  06/15/2019   Procedure: PERIPHERAL VASCULAR INTERVENTION;  Surgeon: Yates Decamp, MD;  Location: [facility] INVASIVE CV LAB;  Service: Cardiovascular;;  LEIA stent  .  REFRACTIVE SURGERY     Right Eye   Family History  Problem Relation Age of Onset  . Hypertension Mother   . Hyperlipidemia Mother   . Heart disease Mother    Social History   Tobacco Use  . Smoking status: Former Smoker    Packs/day: 1.00    Years: 15.00     Pack years: 15.00    Types: Cigarettes    Quit date: 07/17/2015    Years since quitting: 3.9  . Smokeless tobacco: Never Used  Substance Use Topics  . Alcohol use: No   Marital Status: Married ROS  Review of Systems  Cardiovascular: Positive for claudication and dyspnea on exertion. Negative for chest pain and leg swelling.  Musculoskeletal: Positive for arthritis and back pain.  Gastrointestinal: Negative for heartburn and melena.   Objective   Vitals with BMI 06/23/2019 06/15/2019 06/15/2019  Height 5\' 3"  - -  Weight 130 lbs - -  BMI 23.03 - -  Systolic 116 116  Diastolic 59 60 75  Pulse 44 44 48    Physical Exam Constitutional:      General: He is not in acute distress.    Appearance: He is well-developed.     Comments: petite  Neck:     Thyroid: No thyromegaly.     Vascular: No JVD.  Cardiovascular:     Rate and Rhythm: Normal rate and regular rhythm.     Pulses: Intact distal pulses.          Carotid pulses are 2+ on the right side and 2+ on the left side.      Femoral pulses are 2+ on the right side with bruit and 1+ on the left side with bruit.      Popliteal pulses are 2+ on the right side and 1+ on the left side.       Dorsalis pedis pulses are 0 on the right side and 1+ on the left side.       Posterior tibial pulses are 1+ on the right side and 0 on the left side.     Heart sounds: Normal heart sounds. No murmur heard.  No gallop.      Comments: Loud bifemoral bruit present. Left groin minimal ecchymosis. Loss of hair. Warm extremity.  2 plus right leg edema, pitting.   No JVD.  Pulmonary:     Effort: Pulmonary effort is normal.     Breath sounds: No wheezing or rales.  Abdominal:     General: Bowel sounds are normal.     Palpations: Abdomen is soft.  Neurological:     Mental Status: He is alert.    Laboratory examination:   Recent Labs    10/30/18 0958 06/11/19 1110  NA 142 137  K 3.9 4.8  CL 108* 104  CO2 23 21  GLUCOSE 95 91  BUN 13 18    CREATININE 0.83 1.07  CALCIUM 9.3 9.9  GFRNONAA 87 68  GFRAA 101 79   estimated creatinine clearance is 48.7 mL/min (by C-G formula based on SCr of 1.07 mg/dL).  CMP Latest Ref Rng & Units 06/11/2019 10/30/2018 09/29/2017  Glucose 65 - 99 mg/dL 91 95 10/01/2017)  BUN 8 - 27 mg/dL 18 13 16   Creatinine 0.76 - 1.27 mg/dL 740(C 1.44)  Sodium 134 - 144 mmol/L 137 142 143  Potassium 3.5 - 5.2 mmol/L 4.8 3.9 3.6  Chloride 96 - 106 mmol/L 104 108(H) 115(H)  CO2 20 - 29 mmol/L 21  23 -  Calcium 8.6 - 10.2 mg/dL 9.9 9.3 -  Total Protein 6.0 - 8.5 g/dL - 7.1 -  Total Bilirubin 0.0 - 1.2 mg/dL - 0.3 -  Alkaline Phos 39 - 117 IU/L - 72 -  AST 0 - 40 IU/L - 18 -  ALT 0 - 44 IU/L - 12 -   CBC Latest Ref Rng & Units 06/11/2019 10/30/2018 09/29/2017  WBC 3.4 - 10.8 x10E3/uL 5.3 6.1 -  Hemoglobin 13.0 - 17.7 g/dL 13.9 13.4 11.9(L)  Hematocrit 37.5 - 51.0 % 41.7 40.4 35.0(L)  Platelets 150 - 450 x10E3/uL 224 234 -   Lipid Panel     Component Value Date/Time   CHOL 153 12/24/2018 1022   TRIG 90 12/24/2018 1022   HDL 57 12/24/2018 1022   LDLCALC 79 12/24/2018 1022   LDLDIRECT 105 (H) 10/30/2018 0958   HEMOGLOBIN A1C No results found for: HGBA1C, MPG TSH Recent Labs    10/30/18 0958  TSH 1.110    External Labs:  04/12/2019: HDL 57.000 mg 04/12/2019 LDL 75.000 mg 04/12/2019 Cholesterol, total 148.000 mg 04/12/2019 Triglycerides 81.000 mg 04/12/2019  Glucose 103. BUN 15.  Medications and allergies  No Known Allergies   Current Outpatient Medications  Medication Instructions  . acetaminophen (TYLENOL) 1,000 mg, Oral, Every 6 hours PRN  . amLODipine-benazepril (LOTREL) 10-40 MG per capsule 1 capsule, Oral, Daily  . aspirin EC 81 mg, Oral, Daily  . brimonidine (ALPHAGAN) 0.2 % ophthalmic solution 1 drop, Right Eye, 2 times daily  . calcium carbonate (TUMS - DOSED IN MG ELEMENTAL CALCIUM) 500 MG chewable tablet 2 tablets, Oral, Daily PRN  . clopidogrel (PLAVIX) 75 MG tablet TAKE 1 TABLET BY  MOUTH  DAILY  . dorzolamide-timolol (COSOPT) 22.3-6.8 MG/ML ophthalmic solution 1 drop, Right Eye, 2 times daily  . gabapentin (NEURONTIN) 300 mg, Oral, 3 times daily  . ketorolac (ACULAR) 0.5 % ophthalmic solution 1 drop, Right Eye, 4 times daily  . latanoprost (XALATAN) 0.005 % ophthalmic solution 1 drop, Right Eye, Daily at bedtime  . nitroGLYCERIN (NITROSTAT) 0.4 MG SL tablet DISSOLVE ONE TABLET UNDER THE TONGUE EVERY 5 MINUTES AS NEEDED FOR CHEST PAIN.  DO NOT EXCEED A TOTAL OF 3 DOSES IN 15 MINUTES  . rosuvastatin (CRESTOR) 20 mg, Oral, Daily   Radiology:   CT Angio Chest 02/17/2016: 1. No evidence of aortic dissection. No evidence of aneurysmaldilatation. 2. No evidence of significant pulmonary embolus. 3. Scattered calcific atherosclerotic disease along the abdominal aorta and its branches, with likely mild to moderate luminal narrowing along the external and internal iliac arteries bilaterally. 4. **An incidental finding of potential clinical significance has been found. 1.9 cm hypodense nodule at the right thyroid lobe. 5. Scattered coronary artery calcifications seen. 6. Minimal emphysema at the lung apices.  Lungs otherwise clear. 7. Small umbilical hernia, containing only fat. 8. Mild degenerative change along the lumbar spine.  Cardiac Studies:   Peripheral arteriogram 05/16/2015: Right SFA mid to distal 95% restenosis, 1.5 mm burr and 5x150 mm DCB. Stenosis reduced to 0-%. Atherectomy performed 04/26/2014 in the right peroneal artery is widely patent. TP trunk atherectomy widely patent. 2 vessel runoff in the form of peroneal and posterior tibial artery.  Angioplasty performed 03/15/2014 Left external iliac artery 9.0 x 60, 9.0 x 20 mm self-expanding stent. Left distal SFA and proximal popliteal and tibioperoneal trunk atherectomy with CSI 1.5 mm Crown site is widely patent. Left proximal SFA has a focal 80-90% stenosis. 2 vessel runoff in the  form of peroneal and PT.  Exercise  Myoview stress test 03/23/2018: 1. The patient performed treadmill exercise using Bruce protocol, completing 5:50 minutes. The patient completed an estimated workload of 7 METS, reaching 83% of the maximum predicted heart rate. Exercise capacity was low. Hemodynamic response was normal. No stress symptoms reported. No ischemic changes seen on stress electrocardiogram.  Marginally sub-maximal stress test. 2.  The overall quality of the study is good. There is no evidence of abnormal lung activity. Stress and rest SPECT images demonstrate homogeneous tracer distribution throughout the myocardium. Gated SPECT imaging reveals normal myocardial thickening and wall motion. The left ventricular ejection fraction was calculated 46%, although visually appears normal.    3. Low risk study. Marginally sub-maximal study. Clinical correlation recommended.   ABI 11/13/2018: This exam reveals moderately decreased perfusion of the right lower extremity, noted at the anterior tibial and post tibial artery level (ABI 0.73). This exam reveals moderately decreased perfusion of the left lower extremity, noted at the anterior tibial and post tibial artery level (ABI 0.64).  Compared to the study done on 02/17/2018, no significant change, right ABI 0.72 and left ABI 0.48.  EKG   EKG 06/23/2019: Marked sinus bradycardia at rate of 44 bpm, borderline criteria for left renal enlargement otherwise normal EKG. No significant change from EKG 05/20/2019, 08/12/2018.   Assessment     ICD-10-CM   1. Essential hypertension, benign  I10 EKG 12-Lead  2. PVD (peripheral vascular disease) (HCC)  I73.9 PCV ANKLE BRACHIAL INDEX (ABI)  3. Sinus bradycardia on ECG  R00.1     No orders of the defined types were placed in this encounter.  There are no discontinued medications.  Recommendations:   Timothy Webster  is a 74 y.o.  AAM male  with PAD, hyperlipidemia, tobacco use disorder which he has quit in May 2017. He has left iliac artery  stenting (2016) and left SFA and tibioperoneal trunk angioplasty and also right SFA angioplasty in 2016 and 2017 respectively and repeat angioplasty to his right SFA in 2019 again with atherectomy followed by drug coated balloon angioplasty. He has a residual high-grade stenosis in the left proximal SFA disease He also has asymptomatic marked S. Bradycardia.   He underwent repeat peripheral arteriogram on 06/15/2019 and successful chocolate balloon angioplasty of the left distal SFA and proximal popliteal artery and has noticed improvement in symptoms of claudication but did develop a microperforation leading to mild left calf hematoma.  There is no compartment syndrome.  He has residual right SFA high-grade stenosis that needs orbital atherectomy.  I will schedule it in the next couple of weeks, his wife is present at the bedside and all questions answered including potential complications as previously discussed.  They would like to proceed.  His blood pressure is well controlled, he continues to have asymptomatic sinus bradycardia.  Yates Decamp, MD, [facility] 06/23/2019, 10:36 PM [facility] Pager: [phone] Office: [phone]

## 2019-07-14 ENCOUNTER — Other Ambulatory Visit: Payer: Self-pay

## 2019-07-14 ENCOUNTER — Ambulatory Visit: Payer: Medicare Other | Admitting: Cardiology

## 2019-07-14 ENCOUNTER — Encounter: Payer: Self-pay | Admitting: Cardiology

## 2019-07-14 VITALS — BP 136/63 | HR 53 | Resp 16 | Ht 63.0 in | Wt 133.0 lb

## 2019-07-14 DIAGNOSIS — I10 Essential (primary) hypertension: Secondary | ICD-10-CM | POA: Diagnosis not present

## 2019-07-14 DIAGNOSIS — I739 Peripheral vascular disease, unspecified: Secondary | ICD-10-CM

## 2019-07-14 NOTE — Progress Notes (Signed)
 Primary Physician/Referring:  Hill, Gerald, MD  [name], male    DOB: 09/02/1945, 74 y.o.   MRN: [id number]  Chief Complaint  Patient presents with  . Follow-up    3 week  . SX Clearance   HPI:    Timothy Webster  is a 74 y.o. AAM male  with PAD, hyperlipidemia, tobacco use disorder which he has quit in May 2017. He has left iliac artery stenting (2016) and left SFA and tibioperoneal trunk angioplasty and also right SFA angioplasty in 2016 and 2017 respectively and repeat angioplasty to his right SFA in 2019 again with atherectomy followed by drug coated balloon angioplasty. He has a residual high-grade stenosis in the left proximal SFA disease.  He underwent repeat peripheral arteriogram on 06/15/2019 and successful chocolate balloon angioplasty of the left distal SFA and proximal popliteal artery.  He now presents for follow-up, left leg swelling and pain after microperforation is now better. He still has persistent right leg claudication.  He has residual right SFA disease.  Otherwise from cardiac standpoint he remains stable without angina pectoris.    Past Medical History:  Diagnosis Date  . Glaucoma 02/25/2018  . Hyperlipidemia   . Hypertension   . PAD (peripheral artery disease) (HCC)   . Sinus bradycardia on ECG 02/25/2018   EKG 10/12/2015: Marked sinus bradycardia at rate of 44 bpm, normal axis. No evidence of ischemia otherwise normal EKG. No significant change from EKG 04/19/2015: Normal sinus rhythm at rate of 52 bpm, EKG 12/24/2013: Marked sinus bradycardia at the rate of 39 bpm.  . Tobacco use disorder, severe, dependence 02/25/2018   Past Surgical History:  Procedure Laterality Date  . INSERTION OF ILIAC STENT  03/15/2014   Procedure: INSERTION OF ILIAC STENT;  Surgeon: Jaeger Trueheart, MD;  Location: [facility];  Service: Cardiovascular;;  LEIA   . LOWER EXTREMITY ANGIOGRAM N/A 03/15/2014   Procedure: LOWER EXTREMITY ANGIOGRAM;  Surgeon: Bradi Arbuthnot, MD;   Location: [facility];  Service: Cardiovascular;  Laterality: N/A;  . LOWER EXTREMITY ANGIOGRAM Right 04/26/2014   Procedure: LOWER EXTREMITY ANGIOGRAM;  Surgeon: Aleksia Freiman, MD;  Location: [facility];  Service: Cardiovascular;  Laterality: Right;  . LOWER EXTREMITY ANGIOGRAPHY Bilateral 01/28/2017   Procedure: LOWER EXTREMITY ANGIOGRAPHY;  Surgeon: Leonie Amacher, MD;  Location: [facility];  Service: Cardiovascular;  Laterality: Bilateral;  . LOWER EXTREMITY ANGIOGRAPHY N/A 06/15/2019   Procedure: LOWER EXTREMITY ANGIOGRAPHY;  Surgeon: Lymon Kidney, MD;  Location: [facility];  Service: Cardiovascular;  Laterality: N/A;  . PERIPHERAL VASCULAR BALLOON ANGIOPLASTY  06/15/2019   Procedure: PERIPHERAL VASCULAR BALLOON ANGIOPLASTY;  Surgeon: Tytianna Greenley, MD;  Location: [facility];  Service: Cardiovascular;;  Left SFA  . PERIPHERAL VASCULAR CATHETERIZATION Bilateral 05/16/2015   Procedure: Lower Extremity Angiography;  Surgeon: Kharter Sestak, MD;  Location: [facility];  Service: Cardiovascular;  Laterality: Bilateral;  . PERIPHERAL VASCULAR CATHETERIZATION Right 05/16/2015   Procedure: Peripheral Vascular Atherectomy;  Surgeon: Santos Sollenberger, MD;  Location: [facility];  Service: Cardiovascular;  Laterality: Right;  sfa  . PERIPHERAL VASCULAR INTERVENTION  06/15/2019   Procedure: PERIPHERAL VASCULAR INTERVENTION;  Surgeon: Eugenio Dollins, MD;  Location: [facility];  Service: Cardiovascular;;  LEIA stent  . REFRACTIVE SURGERY     Right Eye   Family History  Problem Relation Age of Onset  . Hypertension Mother   . Hyperlipidemia Mother   . Heart disease Mother      Social History   Tobacco Use  . Smoking status: Former Smoker    Packs/day: 1.00    Years: 15.00    Pack years: 15.00    Types: Cigarettes    Quit date: 07/17/2015    Years since quitting: 3.9  . Smokeless tobacco: Never Used  Substance Use Topics  . Alcohol use: No   Marital Status: Married ROS  Review of  Systems  Cardiovascular: Positive for claudication and dyspnea on exertion. Negative for chest pain and leg swelling.  Musculoskeletal: Positive for arthritis and back pain.  Gastrointestinal: Negative for heartburn and melena.   Objective   Vitals with BMI 07/14/2019 06/23/2019 06/15/2019  Height 5\' 3"  5\' 3"  -  Weight 133 lbs 130 lbs -  BMI 23.57 23.03 -  Systolic 136 116  Diastolic 63 59 60  Pulse 53 44 44    Physical Exam Constitutional:      General: He is not in acute distress.    Appearance: He is well-developed.     Comments: petite  Neck:     Thyroid: No thyromegaly.     Vascular: No JVD.  Cardiovascular:     Rate and Rhythm: Normal rate and regular rhythm.     Pulses:          Carotid pulses are 2+ on the right side and 2+ on the left side.      Femoral pulses are 2+ on the right side with bruit and 1+ on the left side with bruit.      Popliteal pulses are 2+ on the right side and 2+ on the left side.       Dorsalis pedis pulses are 0 on the right side and 0 on the left side.       Posterior tibial pulses are 0 on the right side and 2+ on the left side.     Heart sounds: Normal heart sounds. No murmur heard.  No gallop.      Comments: Loud bifemoral bruit present. Loss of hair. Warm extremity.  1+left leg edema, pitting.   No JVD.  Pulmonary:     Effort: Pulmonary effort is normal.     Breath sounds: No wheezing or rales.  Abdominal:     General: Bowel sounds are normal.     Palpations: Abdomen is soft.  Neurological:     Mental Status: He is alert.    Laboratory examination:   Recent Labs    10/30/18 0958 06/11/19 1110  NA 142 137  K 3.9 4.8  CL 108* 104  CO2 23 21  GLUCOSE 95 91  BUN 13 18  CREATININE 0.83 1.07  CALCIUM 9.3 9.9  GFRNONAA 87 68  GFRAA 101 79   CrCl cannot be calculated (Patient's most recent lab result is older than the maximum 21 days allowed.).  CMP Latest Ref Rng & Units 06/11/2019 10/30/2018 09/29/2017  Glucose 65 - 99 mg/dL  91 95 11/01/2018)  BUN 8 - 27 mg/dL 18 13 16   Creatinine 0.76 - 1.27 mg/dL 10/01/2017 412(I )  Sodium 134 - 144 mmol/L 137 142 143  Potassium 3.5 - 5.2 mmol/L 4.8 3.9 3.6  Chloride 96 - 106 mmol/L 104 108(H) 115(H)  CO2 20 - 29 mmol/L 21 23 -  Calcium 8.6 - 10.2 mg/dL 9.9 9.3 -  Total Protein 6.0 - 8.5 g/dL - 7.1 -  Total Bilirubin 0.0 - 1.2 mg/dL - 0.3 -  Alkaline Phos 39 - 117 IU/L - 72 -  AST 0 - 40 IU/L - 18 -  ALT 0 - 44 IU/L - 12 -   CBC Latest Ref Rng & Units 06/11/2019 10/30/2018 09/29/2017  WBC 3.4 - 10.8 x10E3/uL 5.3 6.1 -  Hemoglobin 13.0 - 17.7 g/dL 27.0 62.3 11.9(L)  Hematocrit 37.5 - 51.0 % 41.7 40.4 35.0(L)  Platelets 150 - 450 x10E3/uL 224 234 -   Lipid Panel     Component Value Date/Time   CHOL 153 12/24/2018 1022   TRIG 90 12/24/2018 1022   HDL 57 12/24/2018 1022   LDLCALC 79 12/24/2018 1022   LDLDIRECT 105 (H) 10/30/2018 0958   HEMOGLOBIN A1C No results found for: HGBA1C, MPG TSH Recent Labs    10/30/18 0958  TSH 1.110    External Labs:  04/12/2019: HDL 57.000 mg 04/12/2019 LDL 75.000 mg 04/12/2019 Cholesterol, total 148.000 mg 04/12/2019 Triglycerides 81.000 mg 04/12/2019  Glucose 103. BUN 15.  Medications and allergies  No Known Allergies   Current Outpatient Medications  Medication Instructions  . acetaminophen (TYLENOL) 1,000 mg, Oral, Every 6 hours PRN  . amLODipine-benazepril (LOTREL) 10-40 MG per capsule 1 capsule, Oral, Daily  . aspirin EC 81 mg, Oral, Daily  . brimonidine (ALPHAGAN) 0.2 % ophthalmic solution 1 drop, Right Eye, 2 times daily  . calcium carbonate (TUMS - DOSED IN MG ELEMENTAL CALCIUM) 500 MG chewable tablet 2 tablets, Oral, Daily PRN  . clopidogrel (PLAVIX) 75 MG tablet TAKE 1 TABLET BY MOUTH  DAILY  . dorzolamide-timolol (COSOPT) 22.3-6.8 MG/ML ophthalmic solution 1 drop, Right Eye, 2 times daily  . gabapentin (NEURONTIN) 300 mg, Oral, 2 times daily  . ketorolac (ACULAR) 0.5 % ophthalmic solution 1 drop, Right Eye, 4 times daily    . latanoprost (XALATAN) 0.005 % ophthalmic solution 1 drop, Right Eye, Daily at bedtime  . Menthol-Methyl Salicylate (MUSCLE RUB) 10-15 % CREA 1 application, Topical, As needed  . nitroGLYCERIN (NITROSTAT) 0.4 MG SL tablet DISSOLVE ONE TABLET UNDER THE TONGUE EVERY 5 MINUTES AS NEEDED FOR CHEST PAIN.  DO NOT EXCEED A TOTAL OF 3 DOSES IN 15 MINUTES  . rosuvastatin (CRESTOR) 20 mg, Oral, Daily   Radiology:   CT Angio Chest 02/17/2016: 1. No evidence of aortic dissection. No evidence of aneurysmaldilatation. 2. No evidence of significant pulmonary embolus. 3. Scattered calcific atherosclerotic disease along the abdominal aorta and its branches, with likely mild to moderate luminal narrowing along the external and internal iliac arteries bilaterally. 4. **An incidental finding of potential clinical significance has been found. 1.9 cm hypodense nodule at the right thyroid lobe. 5. Scattered coronary artery calcifications seen. 6. Minimal emphysema at the lung apices.  Lungs otherwise clear. 7. Small umbilical hernia, containing only fat. 8. Mild degenerative change along the lumbar spine.  Cardiac Studies:   Peripheral arteriogram 05/16/2015: Right SFA mid to distal 95% restenosis, 1.5 mm burr and 5x150 mm DCB. Stenosis reduced to 0-%. Atherectomy performed 04/26/2014 in the right peroneal artery is widely patent. TP trunk atherectomy widely patent. 2 vessel runoff in the form of peroneal and posterior tibial artery.  Angioplasty performed 03/15/2014 Left external iliac artery 9.0 x 60, 9.0 x 20 mm self-expanding stent. Left distal SFA and proximal popliteal and tibioperoneal trunk atherectomy with CSI 1.5 mm Crown site is widely patent. Left proximal SFA has a focal 80-90% stenosis. 2 vessel runoff in the form of peroneal and PT.  Exercise Myoview stress test 03/23/2018: 1. The patient performed treadmill exercise using Bruce protocol, completing 5:50 minutes. The patient completed an  estimated  workload of 7 METS, reaching 83% of the maximum predicted heart rate. Exercise capacity was low. Hemodynamic response was normal. No stress symptoms reported. No ischemic changes seen on stress electrocardiogram.  Marginally sub-maximal stress test. 2.  The overall quality of the study is good. There is no evidence of abnormal lung activity. Stress and rest SPECT images demonstrate homogeneous tracer distribution throughout the myocardium. Gated SPECT imaging reveals normal myocardial thickening and wall motion. The left ventricular ejection fraction was calculated 46%, although visually appears normal.    3. Low risk study. Marginally sub-maximal study. Clinical correlation recommended.   ABI 11/13/2018: This exam reveals moderately decreased perfusion of the right lower extremity, noted at the anterior tibial and post tibial artery level (ABI 0.73). This exam reveals moderately decreased perfusion of the left lower extremity, noted at the anterior tibial and post tibial artery level (ABI 0.64).  Compared to the study done on 02/17/2018, no significant change, right ABI 0.72 and left ABI 0.48.  Peripheral arteriogram 06/15/19: Abdominal aortogram:  No evidence of abdominal aneurysm. 2 renal arteries one on either side and they're widely patent.  Aortoiliac bifurcation was widely patent.  There was diffuse calcification of the abdominal aorta.  No aneurysm identified. Right iliac artery and right common femoral artery widely patent.  Right SFA has severe calcification and there is a high-grade proximal 90% stenosis followed by skipped multiple 80 to 90% stenosis and distal right SFA has a 30 to 35 mm of 80% stenosis.  Two-vessel runoff in the form of peroneal and PT on the right.  Left common and external iliac artery has a 60 to 70% stenosis, there was a 25 mmHg pressure gradient SP self-expanding absolute Pro 9.0 x 60 mm stent deployed and postdilated with a 8.0 x 40 mm Mustang balloon. Left SFA has  focal restenosis from 2017 both in the left distal SFA and left proximal to mid segment of the popliteal artery SP chocolate balloon angioplasty with a 5 mm x 40 mm chocolate balloon and the left distal proximal to mid popliteal artery angioplasty second inflation was performed with a 5 mm x 40 mm Mustang balloon with less than 5% residual stenosis and brisk flow.  Recommendation: Patient will be discharged home today, he will need elective angioplasty to his right SFA with [facility]herectomy with [facility]. This will be arranged in elective fashion.  140 mL contrast utilized.   EKG   EKG 06/23/2019: Marked sinus bradycardia at rate of 44 bpm, borderline criteria for left renal enlargement otherwise normal EKG. No significant change from EKG 05/20/2019, 08/12/2018.   Assessment     ICD-10-CM   1. PVD (peripheral vascular disease) (HCC)  I73.9 PCV ANKLE BRACHIAL INDEX (ABI)  2. Essential hypertension, benign  I10     No orders of the defined types were placed in this encounter.  There are no discontinued medications.  Recommendations:   Timothy Webster  is a 74 y.o.  AAM male  with PAD, hyperlipidemia, tobacco use disorder which he has quit in May 2017. He has left iliac artery stenting (2016) and left SFA and tibioperoneal trunk angioplasty and also right SFA angioplasty in 2016 and 2017 respectively and repeat angioplasty to his right SFA in 2019 again with atherectomy followed by drug coated balloon angioplasty. He has a residual high-grade stenosis in the left proximal SFA disease He also has asymptomatic marked S. Bradycardia.   He underwent repeat peripheral arteriogram  on 06/15/2019 and successful chocolate balloon angioplasty of the left distal SFA and proximal popliteal artery and has noticed improvement in symptoms of claudication.  He has residual right SFA high-grade stenosis that needs orbital atherectomy.  I will schedule it in the next couple of weeks. I  will repeat ABI after the repeat procedure.   His blood pressure is well controlled, he continues to have asymptomatic sinus bradycardia.  Yates Decamp, MD, [facility] 07/14/2019, 1:52 PM [facility] [place] Pager: [phone] Office: [phone]

## 2019-07-14 NOTE — H&P (View-Only) (Signed)
Primary Physician/Referring:  [name], MD  [name], male    DOB: 1945-09-22, 74 y.o.   MRN: [id number]  Chief Complaint  Patient presents with  . Follow-up    3 week  . SX Clearance   HPI:    Timothy Webster  is a 74 y.o. AAM male  with PAD, hyperlipidemia, tobacco use disorder which he has quit in May 2017. He has left iliac artery stenting (2016) and left SFA and tibioperoneal trunk angioplasty and also right SFA angioplasty in 2016 and 2017 respectively and repeat angioplasty to his right SFA in 2019 again with atherectomy followed by drug coated balloon angioplasty. He has a residual high-grade stenosis in the left proximal SFA disease.  He underwent repeat peripheral arteriogram on 06/15/2019 and successful chocolate balloon angioplasty of the left distal SFA and proximal popliteal artery.  He now presents for follow-up, left leg swelling and pain after microperforation is now better. He still has persistent right leg claudication.  He has residual right SFA disease.  Otherwise from cardiac standpoint he remains stable without angina pectoris.    Past Medical History:  Diagnosis Date  . Glaucoma 02/25/2018  . Hyperlipidemia   . Hypertension   . PAD (peripheral artery disease) (HCC)   . Sinus bradycardia on ECG 02/25/2018   EKG 10/12/2015: Marked sinus bradycardia at rate of 44 bpm, normal axis. No evidence of ischemia otherwise normal EKG. No significant change from EKG 04/19/2015: Normal sinus rhythm at rate of 52 bpm, EKG 12/24/2013: Marked sinus bradycardia at the rate of 39 bpm.  . Tobacco use disorder, severe, dependence 02/25/2018   Past Surgical History:  Procedure Laterality Date  . INSERTION OF ILIAC STENT  03/15/2014   Procedure: INSERTION OF ILIAC STENT;  Surgeon: Yates Decamp, MD;  Location: [facility] CATH LAB;  Service: Cardiovascular;;  LEIA   . LOWER EXTREMITY ANGIOGRAM N/A 03/15/2014   Procedure: LOWER EXTREMITY ANGIOGRAM;  Surgeon: Yates Decamp, MD;   Location: [facility] CATH LAB;  Service: Cardiovascular;  Laterality: N/A;  . LOWER EXTREMITY ANGIOGRAM Right 04/26/2014   Procedure: LOWER EXTREMITY ANGIOGRAM;  Surgeon: Yates Decamp, MD;  Location: [facility] CATH LAB;  Service: Cardiovascular;  Laterality: Right;  . LOWER EXTREMITY ANGIOGRAPHY Bilateral 01/28/2017   Procedure: LOWER EXTREMITY ANGIOGRAPHY;  Surgeon: Yates Decamp, MD;  Location: [facility] INVASIVE CV LAB;  Service: Cardiovascular;  Laterality: Bilateral;  . LOWER EXTREMITY ANGIOGRAPHY N/A 06/15/2019   Procedure: LOWER EXTREMITY ANGIOGRAPHY;  Surgeon: Yates Decamp, MD;  Location: [facility] INVASIVE CV LAB;  Service: Cardiovascular;  Laterality: N/A;  . PERIPHERAL VASCULAR BALLOON ANGIOPLASTY  06/15/2019   Procedure: PERIPHERAL VASCULAR BALLOON ANGIOPLASTY;  Surgeon: Yates Decamp, MD;  Location: [facility] INVASIVE CV LAB;  Service: Cardiovascular;;  Left SFA  . PERIPHERAL VASCULAR CATHETERIZATION Bilateral 05/16/2015   Procedure: Lower Extremity Angiography;  Surgeon: Yates Decamp, MD;  Location: [facility] INVASIVE CV LAB;  Service: Cardiovascular;  Laterality: Bilateral;  . PERIPHERAL VASCULAR CATHETERIZATION Right 05/16/2015   Procedure: Peripheral Vascular Atherectomy;  Surgeon: Yates Decamp, MD;  Location: [facility] INVASIVE CV LAB;  Service: Cardiovascular;  Laterality: Right;  sfa  . PERIPHERAL VASCULAR INTERVENTION  06/15/2019   Procedure: PERIPHERAL VASCULAR INTERVENTION;  Surgeon: Yates Decamp, MD;  Location: [facility] INVASIVE CV LAB;  Service: Cardiovascular;;  LEIA stent  . REFRACTIVE SURGERY     Right Eye   Family History  Problem Relation Age of Onset  . Hypertension Mother   . Hyperlipidemia Mother   . Heart disease Mother  Social History   Tobacco Use  . Smoking status: Former Smoker    Packs/day: 1.00    Years: 15.00    Pack years: 15.00    Types: Cigarettes    Quit date: 07/17/2015    Years since quitting: 3.9  . Smokeless tobacco: Never Used  Substance Use Topics  . Alcohol use: No   Marital Status: Married ROS  Review of  Systems  Cardiovascular: Positive for claudication and dyspnea on exertion. Negative for chest pain and leg swelling.  Musculoskeletal: Positive for arthritis and back pain.  Gastrointestinal: Negative for heartburn and melena.   Objective   Vitals with BMI 07/14/2019 06/23/2019 06/15/2019  Height 5\' 3"  5\' 3"  -  Weight 133 lbs 130 lbs -  BMI 23.57 23.03 -  Systolic 136 116  Diastolic 63 59 60  Pulse 53 44 44    Physical Exam Constitutional:      General: He is not in acute distress.    Appearance: He is well-developed.     Comments: petite  Neck:     Thyroid: No thyromegaly.     Vascular: No JVD.  Cardiovascular:     Rate and Rhythm: Normal rate and regular rhythm.     Pulses:          Carotid pulses are 2+ on the right side and 2+ on the left side.      Femoral pulses are 2+ on the right side with bruit and 1+ on the left side with bruit.      Popliteal pulses are 2+ on the right side and 2+ on the left side.       Dorsalis pedis pulses are 0 on the right side and 0 on the left side.       Posterior tibial pulses are 0 on the right side and 2+ on the left side.     Heart sounds: Normal heart sounds. No murmur heard.  No gallop.      Comments: Loud bifemoral bruit present. Loss of hair. Warm extremity.  1+left leg edema, pitting.   No JVD.  Pulmonary:     Effort: Pulmonary effort is normal.     Breath sounds: No wheezing or rales.  Abdominal:     General: Bowel sounds are normal.     Palpations: Abdomen is soft.  Neurological:     Mental Status: He is alert.    Laboratory examination:   Recent Labs    10/30/18 0958 06/11/19 1110  NA 142 137  K 3.9 4.8  CL 108* 104  CO2 23 21  GLUCOSE 95 91  BUN 13 18  CREATININE 0.83 1.07  CALCIUM 9.3 9.9  GFRNONAA 87 68  GFRAA 101 79   CrCl cannot be calculated (Patient's most recent lab result is older than the maximum 21 days allowed.).  CMP Latest Ref Rng & Units 06/11/2019 10/30/2018 09/29/2017  Glucose 65 - 99 mg/dL  91 95 11/01/2018)  BUN 8 - 27 mg/dL 18 13 16   Creatinine 0.76 - 1.27 mg/dL 10/01/2017 412(I )  Sodium 134 - 144 mmol/L 137 142 143  Potassium 3.5 - 5.2 mmol/L 4.8 3.9 3.6  Chloride 96 - 106 mmol/L 104 108(H) 115(H)  CO2 20 - 29 mmol/L 21 23 -  Calcium 8.6 - 10.2 mg/dL 9.9 9.3 -  Total Protein 6.0 - 8.5 g/dL - 7.1 -  Total Bilirubin 0.0 - 1.2 mg/dL - 0.3 -  Alkaline Phos 39 - 117 IU/L - 72 -  AST 0 - 40 IU/L - 18 -  ALT 0 - 44 IU/L - 12 -   CBC Latest Ref Rng & Units 06/11/2019 10/30/2018 09/29/2017  WBC 3.4 - 10.8 x10E3/uL 5.3 6.1 -  Hemoglobin 13.0 - 17.7 g/dL 27.0 62.3 11.9(L)  Hematocrit 37.5 - 51.0 % 41.7 40.4 35.0(L)  Platelets 150 - 450 x10E3/uL 224 234 -   Lipid Panel     Component Value Date/Time   CHOL 153 12/24/2018 1022   TRIG 90 12/24/2018 1022   HDL 57 12/24/2018 1022   LDLCALC 79 12/24/2018 1022   LDLDIRECT 105 (H) 10/30/2018 0958   HEMOGLOBIN A1C No results found for: HGBA1C, MPG TSH Recent Labs    10/30/18 0958  TSH 1.110    External Labs:  04/12/2019: HDL 57.000 mg 04/12/2019 LDL 75.000 mg 04/12/2019 Cholesterol, total 148.000 mg 04/12/2019 Triglycerides 81.000 mg 04/12/2019  Glucose 103. BUN 15.  Medications and allergies  No Known Allergies   Current Outpatient Medications  Medication Instructions  . acetaminophen (TYLENOL) 1,000 mg, Oral, Every 6 hours PRN  . amLODipine-benazepril (LOTREL) 10-40 MG per capsule 1 capsule, Oral, Daily  . aspirin EC 81 mg, Oral, Daily  . brimonidine (ALPHAGAN) 0.2 % ophthalmic solution 1 drop, Right Eye, 2 times daily  . calcium carbonate (TUMS - DOSED IN MG ELEMENTAL CALCIUM) 500 MG chewable tablet 2 tablets, Oral, Daily PRN  . clopidogrel (PLAVIX) 75 MG tablet TAKE 1 TABLET BY MOUTH  DAILY  . dorzolamide-timolol (COSOPT) 22.3-6.8 MG/ML ophthalmic solution 1 drop, Right Eye, 2 times daily  . gabapentin (NEURONTIN) 300 mg, Oral, 2 times daily  . ketorolac (ACULAR) 0.5 % ophthalmic solution 1 drop, Right Eye, 4 times daily    . latanoprost (XALATAN) 0.005 % ophthalmic solution 1 drop, Right Eye, Daily at bedtime  . Menthol-Methyl Salicylate (MUSCLE RUB) 10-15 % CREA 1 application, Topical, As needed  . nitroGLYCERIN (NITROSTAT) 0.4 MG SL tablet DISSOLVE ONE TABLET UNDER THE TONGUE EVERY 5 MINUTES AS NEEDED FOR CHEST PAIN.  DO NOT EXCEED A TOTAL OF 3 DOSES IN 15 MINUTES  . rosuvastatin (CRESTOR) 20 mg, Oral, Daily   Radiology:   CT Angio Chest 02/17/2016: 1. No evidence of aortic dissection. No evidence of aneurysmaldilatation. 2. No evidence of significant pulmonary embolus. 3. Scattered calcific atherosclerotic disease along the abdominal aorta and its branches, with likely mild to moderate luminal narrowing along the external and internal iliac arteries bilaterally. 4. **An incidental finding of potential clinical significance has been found. 1.9 cm hypodense nodule at the right thyroid lobe. 5. Scattered coronary artery calcifications seen. 6. Minimal emphysema at the lung apices.  Lungs otherwise clear. 7. Small umbilical hernia, containing only fat. 8. Mild degenerative change along the lumbar spine.  Cardiac Studies:   Peripheral arteriogram 05/16/2015: Right SFA mid to distal 95% restenosis, 1.5 mm burr and 5x150 mm DCB. Stenosis reduced to 0-%. Atherectomy performed 04/26/2014 in the right peroneal artery is widely patent. TP trunk atherectomy widely patent. 2 vessel runoff in the form of peroneal and posterior tibial artery.  Angioplasty performed 03/15/2014 Left external iliac artery 9.0 x 60, 9.0 x 20 mm self-expanding stent. Left distal SFA and proximal popliteal and tibioperoneal trunk atherectomy with CSI 1.5 mm Crown site is widely patent. Left proximal SFA has a focal 80-90% stenosis. 2 vessel runoff in the form of peroneal and PT.  Exercise Myoview stress test 03/23/2018: 1. The patient performed treadmill exercise using Bruce protocol, completing 5:50 minutes. The patient completed an  estimated  workload of 7 METS, reaching 83% of the maximum predicted heart rate. Exercise capacity was low. Hemodynamic response was normal. No stress symptoms reported. No ischemic changes seen on stress electrocardiogram.  Marginally sub-maximal stress test. 2.  The overall quality of the study is good. There is no evidence of abnormal lung activity. Stress and rest SPECT images demonstrate homogeneous tracer distribution throughout the myocardium. Gated SPECT imaging reveals normal myocardial thickening and wall motion. The left ventricular ejection fraction was calculated 46%, although visually appears normal.    3. Low risk study. Marginally sub-maximal study. Clinical correlation recommended.   ABI 11/13/2018: This exam reveals moderately decreased perfusion of the right lower extremity, noted at the anterior tibial and post tibial artery level (ABI 0.73). This exam reveals moderately decreased perfusion of the left lower extremity, noted at the anterior tibial and post tibial artery level (ABI 0.64).  Compared to the study done on 02/17/2018, no significant change, right ABI 0.72 and left ABI 0.48.  Peripheral arteriogram 06/15/19: Abdominal aortogram:  No evidence of abdominal aneurysm. 2 renal arteries one on either side and they're widely patent.  Aortoiliac bifurcation was widely patent.  There was diffuse calcification of the abdominal aorta.  No aneurysm identified. Right iliac artery and right common femoral artery widely patent.  Right SFA has severe calcification and there is a high-grade proximal 90% stenosis followed by skipped multiple 80 to 90% stenosis and distal right SFA has a 30 to 35 mm of 80% stenosis.  Two-vessel runoff in the form of peroneal and PT on the right.  Left common and external iliac artery has a 60 to 70% stenosis, there was a 25 mmHg pressure gradient SP self-expanding absolute Pro 9.0 x 60 mm stent deployed and postdilated with a 8.0 x 40 mm Mustang balloon. Left SFA has  focal restenosis from 2017 both in the left distal SFA and left proximal to mid segment of the popliteal artery SP chocolate balloon angioplasty with a 5 mm x 40 mm chocolate balloon and the left distal proximal to mid popliteal artery angioplasty second inflation was performed with a 5 mm x 40 mm Mustang balloon with less than 5% residual stenosis and brisk flow.  Recommendation: Patient will be discharged home today, he will need elective angioplasty to his right SFA with atherectomy with [facility]SI [facility]. This will be arranged in elective fashion.  140 mL contrast utilized.   EKG   EKG 06/23/2019: Marked sinus bradycardia at rate of 44 bpm, borderline criteria for left renal enlargement otherwise normal EKG. No significant change from EKG 05/20/2019, 08/12/2018.   Assessment     ICD-10-CM   1. PVD (peripheral vascular disease) (HCC)  I73.9 PCV ANKLE BRACHIAL INDEX (ABI)  2. Essential hypertension, benign  I10     No orders of the defined types were placed in this encounter.  There are no discontinued medications.  Recommendations:   Timothy Webster  is a 74 y.o.  AAM male  with PAD, hyperlipidemia, tobacco use disorder which he has quit in May 2017. He has left iliac artery stenting (2016) and left SFA and tibioperoneal trunk angioplasty and also right SFA angioplasty in 2016 and 2017 respectively and repeat angioplasty to his right SFA in 2019 again with atherectomy followed by drug coated balloon angioplasty. He has a residual high-grade stenosis in the left proximal SFA disease He also has asymptomatic marked S. Bradycardia.   He underwent repeat peripheral arteriogram  on 06/15/2019 and successful chocolate balloon angioplasty of the left distal SFA and proximal popliteal artery and has noticed improvement in symptoms of claudication.  He has residual right SFA high-grade stenosis that needs orbital atherectomy.  I will schedule it in the next couple of weeks. I  will repeat ABI after the repeat procedure.   His blood pressure is well controlled, he continues to have asymptomatic sinus bradycardia.  Yates Decamp, MD, [facility] 07/14/2019, 1:52 PM [facility] [place] Pager: [phone] Office: [phone]

## 2019-07-15 ENCOUNTER — Other Ambulatory Visit: Payer: Self-pay

## 2019-07-15 DIAGNOSIS — I10 Essential (primary) hypertension: Secondary | ICD-10-CM

## 2019-07-16 LAB — CBC WITH DIFFERENTIAL
Basophils Absolute: 0.1 10*3/uL (ref 0.0–0.2)
Basos: 1 %
EOS (ABSOLUTE): 0.2 10*3/uL (ref 0.0–0.4)
Eos: 2 %
Hematocrit: 41.9 % (ref 37.5–51.0)
Hemoglobin: 13.6 g/dL (ref 13.0–17.7)
Immature Grans (Abs): 0 10*3/uL (ref 0.0–0.1)
Immature Granulocytes: 0 %
Lymphocytes Absolute: 1.9 10*3/uL (ref 0.7–3.1)
Lymphs: 30 %
MCH: 30.9 pg (ref 26.6–33.0)
MCHC: 32.5 g/dL (ref 31.5–35.7)
MCV: 95 fL (ref 79–97)
Monocytes Absolute: 0.5 10*3/uL (ref 0.1–0.9)
Monocytes: 8 %
Neutrophils Absolute: 3.6 10*3/uL (ref 1.4–7.0)
Neutrophils: 59 %
RBC: 4.4 x10E6/uL (ref 4.14–5.80)
RDW: 13.3 % (ref 11.6–15.4)
WBC: 6.2 10*3/uL (ref 3.4–10.8)

## 2019-07-16 LAB — BASIC METABOLIC PANEL
BUN/Creatinine Ratio: 14 (ref 10–24)
BUN: 12 mg/dL (ref 8–27)
CO2: 22 mmol/L (ref 20–29)
Calcium: 9.9 mg/dL (ref 8.6–10.2)
Chloride: 105 mmol/L (ref 96–106)
Creatinine, Ser: 0.83 mg/dL (ref 0.76–1.27)
GFR calc Af Amer: 100 mL/min/{1.73_m2} (ref >59)
GFR calc non Af Amer: 87 mL/min/{1.73_m2} (ref >59)
Glucose: 83 mg/dL (ref 65–99)
Potassium: 4.6 mmol/L (ref 3.5–5.2)
Sodium: 142 mmol/L (ref 134–144)

## 2019-07-20 ENCOUNTER — Encounter (HOSPITAL_COMMUNITY)
Admission: RE | Disposition: A | Discharge status: Home / Self Care | Payer: Self-pay | Source: Home / Self Care | Attending: Cardiology

## 2019-07-20 ENCOUNTER — Observation Stay (HOSPITAL_COMMUNITY)
Admission: RE | Admit: 2019-07-20 | Discharge: 2019-07-21 | Disposition: A | Discharge status: Home / Self Care | Payer: Medicare Other | Attending: Cardiology | Admitting: Cardiology

## 2019-07-20 ENCOUNTER — Other Ambulatory Visit: Payer: Self-pay

## 2019-07-20 DIAGNOSIS — Z8249 Family history of ischemic heart disease and other diseases of the circulatory system: Secondary | ICD-10-CM | POA: Diagnosis not present

## 2019-07-20 DIAGNOSIS — M549 Dorsalgia, unspecified: Secondary | ICD-10-CM | POA: Insufficient documentation

## 2019-07-20 DIAGNOSIS — E785 Hyperlipidemia, unspecified: Secondary | ICD-10-CM | POA: Insufficient documentation

## 2019-07-20 DIAGNOSIS — Z7982 Long term (current) use of aspirin: Secondary | ICD-10-CM | POA: Insufficient documentation

## 2019-07-20 DIAGNOSIS — Z87891 Personal history of nicotine dependence: Secondary | ICD-10-CM | POA: Insufficient documentation

## 2019-07-20 DIAGNOSIS — H409 Unspecified glaucoma: Secondary | ICD-10-CM | POA: Insufficient documentation

## 2019-07-20 DIAGNOSIS — I7389 Other specified peripheral vascular diseases: Secondary | ICD-10-CM | POA: Diagnosis present

## 2019-07-20 DIAGNOSIS — I739 Peripheral vascular disease, unspecified: Secondary | ICD-10-CM | POA: Diagnosis present

## 2019-07-20 DIAGNOSIS — I25118 Atherosclerotic heart disease of native coronary artery with other forms of angina pectoris: Secondary | ICD-10-CM | POA: Diagnosis not present

## 2019-07-20 DIAGNOSIS — I959 Hypotension, unspecified: Secondary | ICD-10-CM | POA: Diagnosis not present

## 2019-07-20 DIAGNOSIS — Z7902 Long term (current) use of antithrombotics/antiplatelets: Secondary | ICD-10-CM | POA: Insufficient documentation

## 2019-07-20 DIAGNOSIS — I119 Hypertensive heart disease without heart failure: Secondary | ICD-10-CM | POA: Diagnosis not present

## 2019-07-20 DIAGNOSIS — I70211 Atherosclerosis of native arteries of extremities with intermittent claudication, right leg: Principal | ICD-10-CM | POA: Insufficient documentation

## 2019-07-20 DIAGNOSIS — Z95828 Presence of other vascular implants and grafts: Secondary | ICD-10-CM | POA: Diagnosis not present

## 2019-07-20 DIAGNOSIS — Z79899 Other long term (current) drug therapy: Secondary | ICD-10-CM | POA: Insufficient documentation

## 2019-07-20 DIAGNOSIS — R319 Hematuria, unspecified: Secondary | ICD-10-CM | POA: Diagnosis not present

## 2019-07-20 HISTORY — PX: PERIPHERAL VASCULAR ATHERECTOMY: CATH118256

## 2019-07-20 LAB — CBC
HCT: 33.4 % — ABNORMAL LOW (ref 39.0–52.0)
Hemoglobin: 10.2 g/dL — ABNORMAL LOW (ref 13.0–17.0)
MCH: 30.6 pg (ref 26.0–34.0)
MCHC: 30.5 g/dL (ref 30.0–36.0)
MCV: 100.3 fL — ABNORMAL HIGH (ref 80.0–100.0)
Platelets: 163 10*3/uL (ref 150–400)
RBC: 3.33 MIL/uL — ABNORMAL LOW (ref 4.22–5.81)
RDW: 14.2 % (ref 11.5–15.5)
WBC: 11.3 10*3/uL — ABNORMAL HIGH (ref 4.0–10.5)
nRBC: 0 % (ref 0.0–0.2)

## 2019-07-20 LAB — POCT ACTIVATED CLOTTING TIME
Activated Clotting Time: 274 seconds
Activated Clotting Time: 307 seconds

## 2019-07-20 LAB — PLATELET COUNT: Platelets: 212 10*3/uL (ref 150–400)

## 2019-07-20 SURGERY — PERIPHERAL VASCULAR ATHERECTOMY
Anesthesia: LOCAL | Laterality: Right

## 2019-07-20 MED ORDER — SODIUM CHLORIDE 0.9 % IV BOLUS
500.0000 mL | Freq: Once | INTRAVENOUS | Status: AC
  Administered 2019-07-20: 500 mL via INTRAVENOUS

## 2019-07-20 MED ORDER — SODIUM CHLORIDE 0.9% FLUSH: 3.0000 mL | Freq: Two times a day (BID) | INTRAVENOUS | Status: DC

## 2019-07-20 MED ORDER — ASPIRIN EC 81 MG PO TBEC
81.0000 mg | DELAYED_RELEASE_TABLET | Freq: Every day | ORAL | Status: DC
  Administered 2019-07-21: 81 mg via ORAL
  Filled 2019-07-20: qty 1

## 2019-07-20 MED ORDER — LATANOPROST 0.005 % OP SOLN
1.0000 [drp] | Freq: Every day | OPHTHALMIC | Status: DC
  Administered 2019-07-20: 1 [drp] via OPHTHALMIC
  Filled 2019-07-20: qty 2.5

## 2019-07-20 MED ORDER — LIDOCAINE HCL (PF) 1 % IJ SOLN
INTRAMUSCULAR | Status: AC
  Filled 2019-07-20: qty 30

## 2019-07-20 MED ORDER — ACETAMINOPHEN 500 MG PO TABS: 1000.0000 mg | ORAL_TABLET | Freq: Four times a day (QID) | ORAL | Status: DC | PRN

## 2019-07-20 MED ORDER — ROSUVASTATIN CALCIUM 20 MG PO TABS
20.0000 mg | ORAL_TABLET | Freq: Every day | ORAL | Status: DC
  Administered 2019-07-20: 20 mg via ORAL
  Filled 2019-07-20: qty 1

## 2019-07-20 MED ORDER — KETOROLAC TROMETHAMINE 0.5 % OP SOLN
1.0000 [drp] | Freq: Four times a day (QID) | OPHTHALMIC | Status: DC
  Administered 2019-07-20 – 2019-07-21 (×2): 1 [drp] via OPHTHALMIC
  Filled 2019-07-20: qty 5

## 2019-07-20 MED ORDER — NITROGLYCERIN IN D5W 200-5 MCG/ML-% IV SOLN
INTRAVENOUS | Status: AC
  Filled 2019-07-20: qty 250

## 2019-07-20 MED ORDER — ONDANSETRON HCL 4 MG/2ML IJ SOLN
4.0000 mg | Freq: Four times a day (QID) | INTRAMUSCULAR | Status: DC | PRN
  Administered 2019-07-20: 4 mg via INTRAVENOUS
  Filled 2019-07-20: qty 2

## 2019-07-20 MED ORDER — CALCIUM CARBONATE ANTACID 500 MG PO CHEW: 2.0000 | CHEWABLE_TABLET | Freq: Every day | ORAL | Status: DC | PRN

## 2019-07-20 MED ORDER — HEPARIN SODIUM (PORCINE) 1000 UNIT/ML IJ SOLN
INTRAMUSCULAR | Status: DC | PRN
  Administered 2019-07-20: 8000 [IU] via INTRAVENOUS
  Administered 2019-07-20 (×2): 1500 [IU] via INTRAVENOUS

## 2019-07-20 MED ORDER — SODIUM CHLORIDE 0.9 % IV SOLN: INTRAVENOUS | Status: DC

## 2019-07-20 MED ORDER — HEPARIN (PORCINE) IN NACL 1000-0.9 UT/500ML-% IV SOLN
INTRAVENOUS | Status: DC | PRN
  Administered 2019-07-20: 500 mL

## 2019-07-20 MED ORDER — LIDOCAINE HCL (PF) 1 % IJ SOLN
INTRAMUSCULAR | Status: DC | PRN
  Administered 2019-07-20: 12 mL

## 2019-07-20 MED ORDER — HEPARIN (PORCINE) IN NACL 1000-0.9 UT/500ML-% IV SOLN
INTRAVENOUS | Status: AC
  Filled 2019-07-20: qty 1000

## 2019-07-20 MED ORDER — HYDRALAZINE HCL 20 MG/ML IJ SOLN: 5.0000 mg | INTRAMUSCULAR | Status: DC | PRN

## 2019-07-20 MED ORDER — FENTANYL CITRATE (PF) 100 MCG/2ML IJ SOLN
INTRAMUSCULAR | Status: DC | PRN
  Administered 2019-07-20: 50 ug via INTRAVENOUS

## 2019-07-20 MED ORDER — SODIUM CHLORIDE 0.9 % IV SOLN: 250.0000 mL | INTRAVENOUS | Status: DC | PRN

## 2019-07-20 MED ORDER — MIDAZOLAM HCL 2 MG/2ML IJ SOLN
INTRAMUSCULAR | Status: DC | PRN
  Administered 2019-07-20: 1 mg via INTRAVENOUS

## 2019-07-20 MED ORDER — MIDAZOLAM HCL 2 MG/2ML IJ SOLN
INTRAMUSCULAR | Status: AC
  Filled 2019-07-20: qty 2

## 2019-07-20 MED ORDER — SODIUM CHLORIDE 0.9 % IV SOLN
INTRAVENOUS | Status: AC | PRN
  Administered 2019-07-20: 1 mL/kg/h via INTRAVENOUS

## 2019-07-20 MED ORDER — CLOPIDOGREL BISULFATE 75 MG PO TABS
75.0000 mg | ORAL_TABLET | Freq: Once | ORAL | Status: AC
  Administered 2019-07-20: 75 mg via ORAL
  Filled 2019-07-20: qty 1

## 2019-07-20 MED ORDER — CLOPIDOGREL BISULFATE 75 MG PO TABS
75.0000 mg | ORAL_TABLET | Freq: Every day | ORAL | Status: DC
  Administered 2019-07-21: 75 mg via ORAL
  Filled 2019-07-20: qty 1

## 2019-07-20 MED ORDER — SODIUM CHLORIDE 0.9% FLUSH: 3.0000 mL | INTRAVENOUS | Status: DC | PRN

## 2019-07-20 MED ORDER — IODIXANOL 320 MG/ML IV SOLN
INTRAVENOUS | Status: DC | PRN
  Administered 2019-07-20: 135 mL

## 2019-07-20 MED ORDER — VIPERSLIDE LUBRICANT OPTIME
TOPICAL | Status: DC | PRN
  Administered 2019-07-20: 700 mL via SURGICAL_CAVITY

## 2019-07-20 MED ORDER — DORZOLAMIDE HCL-TIMOLOL MAL 2-0.5 % OP SOLN
1.0000 [drp] | Freq: Two times a day (BID) | OPHTHALMIC | Status: DC
  Administered 2019-07-20 – 2019-07-21 (×2): 1 [drp] via OPHTHALMIC
  Filled 2019-07-20: qty 10

## 2019-07-20 MED ORDER — SODIUM CHLORIDE 0.9% FLUSH
3.0000 mL | Freq: Two times a day (BID) | INTRAVENOUS | Status: DC
  Administered 2019-07-20: 3 mL via INTRAVENOUS

## 2019-07-20 MED ORDER — ONDANSETRON HCL 4 MG/2ML IJ SOLN: 4.0000 mg | Freq: Once | INTRAMUSCULAR | Status: DC

## 2019-07-20 MED ORDER — BRIMONIDINE TARTRATE 0.2 % OP SOLN
1.0000 [drp] | Freq: Two times a day (BID) | OPHTHALMIC | Status: DC
  Administered 2019-07-20 – 2019-07-21 (×2): 1 [drp] via OPHTHALMIC
  Filled 2019-07-20: qty 5

## 2019-07-20 MED ORDER — HEPARIN SODIUM (PORCINE) 1000 UNIT/ML IJ SOLN
INTRAMUSCULAR | Status: AC
  Filled 2019-07-20: qty 1

## 2019-07-20 MED ORDER — VERAPAMIL HCL 2.5 MG/ML IV SOLN
INTRAVENOUS | Status: AC
  Filled 2019-07-20: qty 2

## 2019-07-20 MED ORDER — SODIUM CHLORIDE 0.9 % WEIGHT BASED INFUSION: 1.0000 mL/kg/h | INTRAVENOUS | Status: AC

## 2019-07-20 MED ORDER — NITROGLYCERIN 0.4 MG SL SUBL: 0.4000 mg | SUBLINGUAL_TABLET | SUBLINGUAL | Status: DC | PRN

## 2019-07-20 MED ORDER — GABAPENTIN 300 MG PO CAPS
300.0000 mg | ORAL_CAPSULE | Freq: Two times a day (BID) | ORAL | Status: DC
  Administered 2019-07-20 – 2019-07-21 (×2): 300 mg via ORAL
  Filled 2019-07-20 (×2): qty 1

## 2019-07-20 MED ORDER — DIPHENHYDRAMINE HCL 50 MG/ML IJ SOLN
12.5000 mg | Freq: Once | INTRAMUSCULAR | Status: AC
  Administered 2019-07-20: 12.5 mg via INTRAVENOUS
  Filled 2019-07-20: qty 1

## 2019-07-20 MED ORDER — FENTANYL CITRATE (PF) 100 MCG/2ML IJ SOLN
INTRAMUSCULAR | Status: AC
  Filled 2019-07-20: qty 2

## 2019-07-20 MED ORDER — SODIUM CHLORIDE 0.9 % IV BOLUS: 250.0000 mL | Freq: Once | INTRAVENOUS | Status: DC

## 2019-07-20 MED ORDER — ACETAMINOPHEN 325 MG PO TABS: 650.0000 mg | ORAL_TABLET | ORAL | Status: DC | PRN

## 2019-07-20 MED ORDER — SODIUM CHLORIDE 0.9 % IV BOLUS: 500.0000 mL | Freq: Once | INTRAVENOUS | Status: DC

## 2019-07-20 SURGICAL SUPPLY — 24 items
BALLN JADE .018 5.0 X 40 (BALLOONS) ×3
BALLOON JADE .018 5.0 X 40 (BALLOONS) ×2 IMPLANT
CATH ANGIO 5F PIGTAIL 65CM (CATHETERS) ×3 IMPLANT
CLOSURE MYNX CONTROL 6F/7F (Vascular Products) ×3 IMPLANT
DCB RANGER 5.0X150 150 (BALLOONS) ×2 IMPLANT
DEVICE EMBOSHIELD NAV6 4.0-7.0 (FILTER) ×3 IMPLANT
DIAMONDBACK SOLID OAS 2.0MM (CATHETERS) ×3
KIT ENCORE 26 ADVANTAGE (KITS) ×3 IMPLANT
KIT MICROPUNCTURE NIT STIFF (SHEATH) ×3 IMPLANT
KIT PV (KITS) ×3 IMPLANT
LUBRICANT VIPERSLIDE CORONARY (MISCELLANEOUS) ×3 IMPLANT
RANGER DCB 5.0X150 150 (BALLOONS) ×3
SHEATH PINNACLE 5F 10CM (SHEATH) ×3 IMPLANT
SHEATH PINNACLE 7F 10CM (SHEATH) ×3 IMPLANT
SHEATH PINNACLE MP 7F 45CM (SHEATH) ×3 IMPLANT
SHEATH PROBE COVER 6X72 (BAG) ×3 IMPLANT
SYR MEDRAD MARK 7 150ML (SYRINGE) ×3 IMPLANT
SYSTEM DIMNDBCK SLD OAS 2.0MM (CATHETERS) ×2 IMPLANT
TAPE VIPERTRACK RADIOPAQ (MISCELLANEOUS) ×2 IMPLANT
TAPE VIPERTRACK RADIOPAQUE (MISCELLANEOUS) ×1
TRANSDUCER W/STOPCOCK (MISCELLANEOUS) ×3 IMPLANT
TRAY PV CATH (CUSTOM PROCEDURE TRAY) ×3 IMPLANT
WIRE HITORQ VERSACORE ST 145CM (WIRE) ×3 IMPLANT
WIRE VIPER ADVANCE .017X335CM (WIRE) ×3 IMPLANT

## 2019-07-20 NOTE — Progress Notes (Signed)
Pt stated he was cold, vision was blurry and he felt woozy. BP was 61/35. Pt placed in trendelenburg. [name] paged. Orders for bolus and fluid rate increase. Pt's BP is doing better but still stating vision is "cloudy" and he feels "woozy".

## 2019-07-20 NOTE — Progress Notes (Signed)
Pt experienced projectile vomiting. Called nurses station for assistance. Patient sitting up in bed on arrival vomiting. Diaphoretic with decreased level of consciousness. Able to answer questions appropriately when prompted. Small marble size hematoma present. Pressure held for 5 minutes. Site now level 0. MD notified. BP stable.

## 2019-07-20 NOTE — Interval H&P Note (Signed)
History and Physical Interval Note:  07/20/2019 10:21 AM  Timothy Webster  has presented today for surgery, with the diagnosis of PAD.  The various methods of treatment have been discussed with the patient and family. After consideration of risks, benefits and other options for treatment, the patient has consented to  Procedure(s): ABDOMINAL AORTOGRAM W/LOWER EXTREMITY (Right) and possible angioplasty as a surgical intervention.  The patient's history has been reviewed, patient examined, no change in status, stable for surgery.  I have reviewed the patient's chart and labs.  Questions were answered to the patient's satisfaction.     Yates Decamp

## 2019-07-20 NOTE — Progress Notes (Signed)
Pt states he is feeling much better now and is no longer dizzy, nausea or hot. He is now stating he's cold, warm blankets given. Site level 0. VSS

## 2019-07-20 NOTE — Progress Notes (Signed)
07/20/2019 1800 Received pt to room 4E-13 from Cath lab.  Pt is A&O, no C/O voiced.  Tele monitor applied and CCMD notified.  CHG bath given.  Oriented to room, call light and bed.  Call bell in reach. Kathryne Hitch

## 2019-07-20 NOTE — Progress Notes (Signed)
I have updated patient status with his wife, hypotension probably related to sedation and also Benadryl, no acute distress, groin site appears very stable with no significant hematoma.  We will watch him overnight and discharge him in the morning, hold antihypertensive medications.   Yates Decamp, MD, [facility] 07/20/2019, 6:36 PM Office: [phone]

## 2019-07-20 NOTE — Progress Notes (Signed)
Pt urinated another of dark red urine. [name] paged, came to bedside. Verbal order for benadryl. Stated pt could leave at 1730 if no other issues arise.

## 2019-07-20 NOTE — Progress Notes (Signed)
Notified Dr. Jacinto Halim of frank red bloody urine. Order for NS bolus received and initiated.

## 2019-07-20 NOTE — Discharge Instructions (Signed)
    Discharge Instructions  Lower Extremity Angiogram; Angioplasty/Stenting  Please refer to the following instructions for your post-procedure care. Your surgeon or physician assistant will discuss any changes with you.  Activity  Avoid lifting more than 8 pounds (1 gallons of milk) for 72 hours (3 days) after your procedure. You may walk as much as you can tolerate. It's OK to drive after 72 hours.  Bathing/Showering  You may shower the day after your procedure. If you have a bandage, you may remove it at 24- 48 hours. Clean your incision site with mild soap and water. Pat the area dry with a clean towel.  Diet  Resume your pre-procedure diet. There are no special food restrictions following this procedure. All patients with peripheral vascular disease should follow a low fat/low cholesterol diet. In order to heal from your surgery, it is CRITICAL to get adequate nutrition. Your body requires vitamins, minerals, and protein. Vegetables are the best source of vitamins and minerals. Vegetables also provide the perfect balance of protein. Processed food has little nutritional value, so try to avoid this.  Medications  Resume taking all of your medications unless your doctor tells you not to. If your incision is causing pain, you may take over-the-counter pain relievers such as acetaminophen (Tylenol)  Follow Up  Follow up will be arranged at the time of your procedure. You may have an office visit scheduled or may be scheduled for surgery. Ask your surgeon if you have any questions.  Please call us immediately for any of the following conditions: .Severe or worsening pain your legs or feet at rest or with walking. .Increased pain, redness, drainage at your groin puncture site. .Fever of 101 degrees or higher. .If you have any mild or slow bleeding from your puncture site: lie down, apply firm constant pressure over the area with a piece of gauze or a clean wash cloth for 30 minutes- no  peeking!, call 911 right away if you are still bleeding after 30 minutes, or if the bleeding is heavy and unmanageable.  Reduce your risk factors of vascular disease:  Stop smoking. If you would like help call [facility] at 1-800-QUIT-NOW (289-055-2937) or [place] at [phone]. Manage your cholesterol Maintain a desired weight Control your diabetes Keep your blood pressure down  If you have any questions, please call Dr. Verl Dicker office

## 2019-07-21 ENCOUNTER — Encounter (HOSPITAL_COMMUNITY): Payer: Self-pay | Admitting: Cardiology

## 2019-07-21 DIAGNOSIS — R319 Hematuria, unspecified: Secondary | ICD-10-CM | POA: Diagnosis not present

## 2019-07-21 DIAGNOSIS — I25118 Atherosclerotic heart disease of native coronary artery with other forms of angina pectoris: Secondary | ICD-10-CM | POA: Diagnosis not present

## 2019-07-21 DIAGNOSIS — I119 Hypertensive heart disease without heart failure: Secondary | ICD-10-CM | POA: Diagnosis not present

## 2019-07-21 DIAGNOSIS — I959 Hypotension, unspecified: Secondary | ICD-10-CM | POA: Diagnosis not present

## 2019-07-21 DIAGNOSIS — I70211 Atherosclerosis of native arteries of extremities with intermittent claudication, right leg: Secondary | ICD-10-CM | POA: Diagnosis not present

## 2019-07-21 DIAGNOSIS — Z79899 Other long term (current) drug therapy: Secondary | ICD-10-CM | POA: Diagnosis not present

## 2019-07-21 DIAGNOSIS — Z8249 Family history of ischemic heart disease and other diseases of the circulatory system: Secondary | ICD-10-CM | POA: Diagnosis not present

## 2019-07-21 DIAGNOSIS — Z95828 Presence of other vascular implants and grafts: Secondary | ICD-10-CM | POA: Diagnosis not present

## 2019-07-21 DIAGNOSIS — Z7902 Long term (current) use of antithrombotics/antiplatelets: Secondary | ICD-10-CM | POA: Diagnosis not present

## 2019-07-21 DIAGNOSIS — Z87891 Personal history of nicotine dependence: Secondary | ICD-10-CM | POA: Diagnosis not present

## 2019-07-21 DIAGNOSIS — H409 Unspecified glaucoma: Secondary | ICD-10-CM | POA: Diagnosis not present

## 2019-07-21 DIAGNOSIS — E785 Hyperlipidemia, unspecified: Secondary | ICD-10-CM | POA: Diagnosis not present

## 2019-07-21 DIAGNOSIS — M549 Dorsalgia, unspecified: Secondary | ICD-10-CM | POA: Diagnosis not present

## 2019-07-21 DIAGNOSIS — Z7982 Long term (current) use of aspirin: Secondary | ICD-10-CM | POA: Diagnosis not present

## 2019-07-21 LAB — BASIC METABOLIC PANEL
Anion gap: 6 (ref 5–15)
BUN: 11 mg/dL (ref 8–23)
CO2: 20 mmol/L — ABNORMAL LOW (ref 22–32)
Calcium: 7.9 mg/dL — ABNORMAL LOW (ref 8.9–10.3)
Chloride: 113 mmol/L — ABNORMAL HIGH (ref 98–111)
Creatinine, Ser: 0.9 mg/dL (ref 0.61–1.24)
GFR calc Af Amer: >60 mL/min (ref >60)
GFR calc non Af Amer: >60 mL/min (ref >60)
Glucose, Bld: 94 mg/dL (ref 70–99)
Potassium: 3.8 mmol/L (ref 3.5–5.1)
Sodium: 139 mmol/L (ref 135–145)

## 2019-07-21 LAB — HEMOGLOBIN AND HEMATOCRIT, BLOOD
HCT: 25.1 % — ABNORMAL LOW (ref 39.0–52.0)
Hemoglobin: 7.9 g/dL — ABNORMAL LOW (ref 13.0–17.0)

## 2019-07-21 LAB — CBC
HCT: 25.5 % — ABNORMAL LOW (ref 39.0–52.0)
Hemoglobin: 7.9 g/dL — ABNORMAL LOW (ref 13.0–17.0)
MCH: 31 pg (ref 26.0–34.0)
MCHC: 31 g/dL (ref 30.0–36.0)
MCV: 100 fL (ref 80.0–100.0)
Platelets: 142 10*3/uL — ABNORMAL LOW (ref 150–400)
RBC: 2.55 MIL/uL — ABNORMAL LOW (ref 4.22–5.81)
RDW: 14.6 % (ref 11.5–15.5)
WBC: 7.1 10*3/uL (ref 4.0–10.5)
nRBC: 0 % (ref 0.0–0.2)

## 2019-07-21 NOTE — Discharge Summary (Addendum)
Physician Discharge Summary  [name] MRN: [id number] DOB/AGE: Jul 10, 1945 74 y.o. [name], MD   Admit date: 07/20/2019 Discharge date: 07/21/2019  Primary Discharge Diagnosis PAD with claudication Hematurea with >5gm drop in hemoglobin. H/H stable and hemodynamics stable.  Hematuria felt to be due to use of IV heparin. CAD with stable angina Hypotension due to volume loss   Significant Diagnostic Studies:  Peripheral arteriogram and angioplasty of the right mid SFA and right popliteal artery 07/20/2019: Successful orbital arthrectomy of the right mid SFA and right proximal popliteal artery with a 2.0 solid crown CSI diamondback catheter followed by plain balloon angioplasty with a 5.0 x 40 mm Jade in the right proximal popliteal artery and drug-coated balloon, 5.0 x 150 mm Ranger into the mid right SFA, stenosis reduced from 90% in the mid right SFA to less than 10% and similarly from 90% calcific stenosis in the right popliteal artery to less than 10% with smooth flow and without edge dissection.  Recommendation: Right common and external iliac artery has a calcific long 50% stenosis with a 20 mmHg pressure gradient and ostial right SFA has a 50 to 60% calcific stenosis, medical therapy for this.  I expect significant improvement in symptoms of claudication involving his right leg.  90 mL contrast utilized.  He is presently on dual antiplatelet therapy and also statins, continue the same.  Radiology: PERIPHERAL VASCULAR CATHETERIZATION  Result Date: 07/20/2019 Peripheral arteriogram and angioplasty of the right mid SFA and right popliteal artery 07/20/2019: Successful orbital arthrectomy of the right mid SFA and right proximal popliteal artery with a 2.0 solid crown CSI diamondback catheter followed by plain balloon angioplasty with a 5.0 x 40 mm Jade in the right proximal popliteal artery and drug-coated balloon, 5.0 x 150 mm Ranger into the mid right SFA, stenosis  reduced from 90% in the mid right SFA to less than 10% and similarly from 90% calcific stenosis in the right popliteal artery to less than 10% with smooth flow and without edge dissection. Recommendation: Right common and external iliac artery has a calcific long 50% stenosis with a 20 mmHg pressure gradient and ostial right SFA has a 50 to 60% calcific stenosis, medical therapy for this.  I expect significant improvement in symptoms of claudication involving his right leg.  90 mL contrast utilized.  He is presently on dual antiplatelet therapy and also statins, continue the same.   Hospital Course: Timothy Webster is a 74 y.o. male  patient  with PAD, hyperlipidemia, tobacco use disorder which he has quit in May 2017. He has left iliac artery stenting (2016) and left SFA and tibioperoneal trunk angioplasty and also right SFA angioplasty in 2016 and 2017 respectively and repeat angioplasty to his right SFA in 2019 again with atherectomy followed by drug coated balloon angioplasty. He has a residual high-grade stenosis in the left proximal SFA disease.  He underwent repeat peripheral arteriogram on 06/15/2019 and successful chocolate balloon angioplasty of the left distal SFA and proximal popliteal artery.  He was then scheduled for elective right SFA angioplasty in view of high-grade disease noted during angiography and continued symptoms of claudication.  Patient underwent successful atherectomy of the right SFA and right popliteal artery, heavy calcification was evident.  And he was scheduled to be discharged home however while in the short stay area, developed severe hematuria, vasovagal episode and hypotension needing IV fluid resuscitation.  Drop in hemoglobin was also noted.  He had mild bleeding in his  left groin area but no significant hematoma.  In view of this patient was kept overnight, he did not need any blood transfusion although there was 5 g drop in hemoglobin.  He had no further hematuria.   Gross hematuria cleared initially with IV fluid resuscitation and became clear. Morning he was stable, he wanted to go home, discharged uneventfully.  Recommendations on discharge: His amlodipine was discontinued due to low blood pressure, he will be continued on aspirin and Plavix, I will repeat his CBC probably in 2 to 3 weeks.  He will need ABI within the next 2 to 3 weeks to establish a new baseline.  Discharge Exam: Vitals with BMI 07/21/2019 07/21/2019 07/21/2019  Height - - -  Weight - - -  BMI - - -  Systolic 118 97 112  Diastolic 56 45 59  Pulse 60 62 71     Physical Exam Constitutional:      General: He is not in acute distress.    Appearance: He is well-developed.     Comments: petite  Neck:     Thyroid: No thyromegaly.     Vascular: No JVD.  Cardiovascular:     Rate and Rhythm: Normal rate and regular rhythm.     Pulses:          Carotid pulses are 2+ on the right side and 2+ on the left side.      Femoral pulses are 2+ on the right side with bruit and 1+ on the left side with bruit.      Popliteal pulses are 2+ on the right side and 2+ on the left side.       Dorsalis pedis pulses are 0 on the right side and 0 on the left side.       Posterior tibial pulses are 1+ on the right side and 2+ on the left side.     Heart sounds: Normal heart sounds. No murmur heard.  No gallop.      Comments: Loud bifemoral bruit present. Loss of hair. Warm extremity.  Capillary refill normal. No leg edema. No JVD.  Pulmonary:     Effort: Pulmonary effort is normal.     Breath sounds: No wheezing or rales.  Abdominal:     General: Bowel sounds are normal.     Palpations: Abdomen is soft.  Neurological:     Mental Status: He is alert.     Labs:   Lab Results  Component Value Date   WBC 7.1 07/21/2019   HGB 7.9 (L) 07/21/2019   HCT 25.1 (L) 07/21/2019   MCV 100.0 07/21/2019   PLT 142 (L) 07/21/2019    Recent Labs  Lab 07/21/19 0358  NA 139  K 3.8  CL 113*  CO2 20*    BUN 11  CREATININE 0.90  CALCIUM 7.9*  GLUCOSE 94    Lipid Panel     Component Value Date/Time   CHOL 153 12/24/2018 1022   TRIG 90 12/24/2018 1022   HDL 57 12/24/2018 1022   LDLCALC 79 12/24/2018 1022    BNP (last 3 results) No results for input(s): BNP in the last 8760 hours.  HEMOGLOBIN A1C No results found for: HGBA1C, MPG  Cardiac Panel  TSH Recent Labs    10/30/18 0958  TSH 1.110    FOLLOW UP PLANS AND APPOINTMENTS  Allergies as of 07/21/2019   No Known Allergies     Medication List    STOP taking these medications   amLODipine-benazepril  10-40 MG capsule Commonly known as: LOTREL     TAKE these medications   acetaminophen 500 MG tablet Commonly known as: TYLENOL Take 1,000 mg by mouth every 6 (six) hours as needed (pain).   aspirin EC 81 MG tablet Take 81 mg by mouth daily.   brimonidine 0.2 % ophthalmic solution Commonly known as: ALPHAGAN Place 1 drop into the right eye 2 (two) times daily.   calcium carbonate 500 MG chewable tablet Commonly known as: TUMS - dosed in mg elemental calcium Chew 2 tablets by mouth daily as needed for indigestion or heartburn.   clopidogrel 75 MG tablet Commonly known as: PLAVIX TAKE 1 TABLET BY MOUTH  DAILY   dorzolamide-timolol 22.3-6.8 MG/ML ophthalmic solution Commonly known as: COSOPT Place 1 drop into the right eye 2 (two) times daily.   gabapentin 300 MG capsule Commonly known as: NEURONTIN Take 300 mg by mouth 2 (two) times daily.   ketorolac 0.5 % ophthalmic solution Commonly known as: ACULAR Place 1 drop into the right eye 4 (four) times daily.   latanoprost 0.005 % ophthalmic solution Commonly known as: XALATAN Place 1 drop into the right eye at bedtime.   Muscle Rub 10-15 % Crea Apply 1 application topically as needed for muscle pain.   nitroGLYCERIN 0.4 MG SL tablet Commonly known as: NITROSTAT DISSOLVE ONE TABLET UNDER THE TONGUE EVERY 5 MINUTES AS NEEDED FOR CHEST PAIN.  DO NOT  EXCEED A TOTAL OF 3 DOSES IN 15 MINUTES What changed: See the new instructions.   rosuvastatin 20 MG tablet Commonly known as: CRESTOR Take 1 tablet (20 mg total) by mouth daily. What changed: when to take this       Follow-up Information    Yates Decamp, MD Follow up on 08/05/2019.   Specialty: Cardiology Contact information: [address] [place] [address] [phone]                Yates Decamp, MD, [facility] 07/26/2019, 12:27 PM Office: [phone]

## 2019-07-21 NOTE — Plan of Care (Signed)
Continue to monitor

## 2019-07-21 NOTE — Progress Notes (Signed)
IV and telemetry discontinued at this time. CCMD notified. Discharge instructions reviewed with patient's wife. All questions answered.   Ernestina Columbia, RN

## 2019-07-29 ENCOUNTER — Other Ambulatory Visit: Payer: Medicare Other

## 2019-08-02 ENCOUNTER — Other Ambulatory Visit: Payer: Self-pay

## 2019-08-02 ENCOUNTER — Ambulatory Visit: Payer: Medicare Other

## 2019-08-02 DIAGNOSIS — I739 Peripheral vascular disease, unspecified: Secondary | ICD-10-CM | POA: Diagnosis not present

## 2019-08-03 DIAGNOSIS — H44522 Atrophy of globe, left eye: Secondary | ICD-10-CM | POA: Diagnosis not present

## 2019-08-03 DIAGNOSIS — H35351 Cystoid macular degeneration, right eye: Secondary | ICD-10-CM | POA: Diagnosis not present

## 2019-08-03 DIAGNOSIS — H3091 Unspecified chorioretinal inflammation, right eye: Secondary | ICD-10-CM | POA: Diagnosis not present

## 2019-08-03 DIAGNOSIS — H4051X3 Glaucoma secondary to other eye disorders, right eye, severe stage: Secondary | ICD-10-CM | POA: Diagnosis not present

## 2019-08-05 ENCOUNTER — Ambulatory Visit: Payer: Medicare Other | Admitting: Cardiology

## 2019-08-05 ENCOUNTER — Encounter: Payer: Self-pay | Admitting: Cardiology

## 2019-08-05 ENCOUNTER — Other Ambulatory Visit: Payer: Self-pay

## 2019-08-05 VITALS — BP 151/79 | HR 55 | Ht 62.0 in | Wt 132.4 lb

## 2019-08-05 DIAGNOSIS — D500 Iron deficiency anemia secondary to blood loss (chronic): Secondary | ICD-10-CM | POA: Diagnosis not present

## 2019-08-05 DIAGNOSIS — I10 Essential (primary) hypertension: Secondary | ICD-10-CM | POA: Diagnosis not present

## 2019-08-05 DIAGNOSIS — I739 Peripheral vascular disease, unspecified: Secondary | ICD-10-CM | POA: Diagnosis not present

## 2019-08-05 MED ORDER — AMLODIPINE BESY-BENAZEPRIL HCL 10-40 MG PO CAPS: 1.0000 | ORAL_CAPSULE | Freq: Every day | ORAL | 3 refills | Status: DC

## 2019-08-05 MED ORDER — FEOSOL 200 (65 FE) MG PO TABS: 2.0000 | ORAL_TABLET | Freq: Every day | ORAL | 0 refills | Status: DC

## 2019-08-05 NOTE — Progress Notes (Signed)
Primary Physician/Referring:  [name], MD  [name], male    DOB: [birth year], 74 y.o.   MRN: [id number]  Chief Complaint  Patient presents with  . PVD  . Follow-up    6 weeks   HPI:    Timothy Webster  is a 74 y.o. AAM male  with PAD, hyperlipidemia, tobacco use disorder which he has quit in May 2017. He underwent peripheral arteriogram on 06/15/2018 and angioplasty to left external iliac artery with implantation of self-expanding stent just distal to the previously placed stent from twenty sixteen.  He also underwent focal stenosis balloon angioplasty of the left proximal SFA.  Left distal SFA orbital atherectomy site from twenty seventeen is widely patent.  He also underwent repeat right femoral arteriogram and angioplasty and atherectomy for right mid SFA and right popliteal artery.  Since then he has noticed significant improvement in symptoms of claudication in bilateral lower extremity.  Patient did have complications on both procedures, he had microperforation the first time and gross hematuria leading to need for hospitalization for a day and blood pressure medications were held due to low blood pressure.  States that he has returned back to his baseline, his wife is present at the bedside.   Past Medical History:  Diagnosis Date  . Glaucoma 02/25/2018  . Hyperlipidemia   . Hypertension   . PAD (peripheral artery disease) (HCC)   . Sinus bradycardia on ECG 02/25/2018   EKG 10/12/2015: Marked sinus bradycardia at rate of 44 bpm, normal axis. No evidence of ischemia otherwise normal EKG. No significant change from EKG 04/19/2015: Normal sinus rhythm at rate of 52 bpm, EKG 12/24/2013: Marked sinus bradycardia at the rate of 39 bpm.  . Tobacco use disorder, severe, dependence 02/25/2018   Past Surgical History:  Procedure Laterality Date  . INSERTION OF ILIAC STENT  03/15/2014   Procedure: INSERTION OF ILIAC STENT;  Surgeon: Yates Decamp, MD;  Location: [facility] CATH LAB;   Service: Cardiovascular;;  Timothy Webster   . LOWER EXTREMITY ANGIOGRAM N/A 03/15/2014   Procedure: LOWER EXTREMITY ANGIOGRAM;  Surgeon: Yates Decamp, MD;  Location: [facility] CATH LAB;  Service: Cardiovascular;  Laterality: N/A;  . LOWER EXTREMITY ANGIOGRAM Right 04/26/2014   Procedure: LOWER EXTREMITY ANGIOGRAM;  Surgeon: Yates Decamp, MD;  Location: [facility] CATH LAB;  Service: Cardiovascular;  Laterality: Right;  . LOWER EXTREMITY ANGIOGRAPHY Bilateral 01/28/2017   Procedure: LOWER EXTREMITY ANGIOGRAPHY;  Surgeon: Yates Decamp, MD;  Location: [facility] INVASIVE CV LAB;  Service: Cardiovascular;  Laterality: Bilateral;  . LOWER EXTREMITY ANGIOGRAPHY N/A 06/15/2019   Procedure: LOWER EXTREMITY ANGIOGRAPHY;  Surgeon: Yates Decamp, MD;  Location: [facility] INVASIVE CV LAB;  Service: Cardiovascular;  Laterality: N/A;  . PERIPHERAL VASCULAR ATHERECTOMY  07/20/2019   Procedure: PERIPHERAL VASCULAR ATHERECTOMY;  Surgeon: Yates Decamp, MD;  Location: [facility] INVASIVE CV LAB;  Service: Cardiovascular;;  Rt SFA  . PERIPHERAL VASCULAR BALLOON ANGIOPLASTY  06/15/2019   Procedure: PERIPHERAL VASCULAR BALLOON ANGIOPLASTY;  Surgeon: Yates Decamp, MD;  Location: [facility] INVASIVE CV LAB;  Service: Cardiovascular;;  Left SFA  . PERIPHERAL VASCULAR CATHETERIZATION Bilateral 05/16/2015   Procedure: Lower Extremity Angiography;  Surgeon: Yates Decamp, MD;  Location: [facility] INVASIVE CV LAB;  Service: Cardiovascular;  Laterality: Bilateral;  . PERIPHERAL VASCULAR CATHETERIZATION Right 05/16/2015   Procedure: Peripheral Vascular Atherectomy;  Surgeon: Yates Decamp, MD;  Location: [facility] INVASIVE CV LAB;  Service: Cardiovascular;  Laterality: Right;  sfa  . PERIPHERAL VASCULAR INTERVENTION  06/15/2019   Procedure: PERIPHERAL VASCULAR  INTERVENTION;  Surgeon: Yates DecampGanji, Jesenya Bowditch, MD;  [facility];  Service: Cardiovascular;;  Timothy Webster stent  . REFRACTIVE SURGERY     Right Eye   Family History  Problem Relation Age of Onset  . Hypertension Mother   . Hyperlipidemia Mother   . Heart disease Mother      Social History   Tobacco Use  . Smoking status: Former Smoker    Packs/day: 1.00    Years: 15.00    Pack years: 15.00    Types: Cigarettes    Quit date: 07/17/2015    Years since quitting: 4.0  . Smokeless tobacco: Never Used  Substance Use Topics  . Alcohol use: No   Marital Status: Married ROS  Review of Systems  Cardiovascular: Positive for claudication and dyspnea on exertion. Negative for chest pain and leg swelling.  Musculoskeletal: Positive for arthritis and back pain.  Gastrointestinal: Negative for heartburn and melena.   Objective   Vitals with BMI 08/05/2019 07/21/2019 07/21/2019  Height 5\' 2"  - -  Weight 132 lbs 6 oz - -  BMI 24.21 - -  Systolic 151 118 97  Diastolic 79 56 45  Pulse 55 60 62    Physical Exam Constitutional:      General: He is not in acute distress.    Appearance: He is well-developed.     Comments: petite  Neck:     Thyroid: No thyromegaly.     Vascular: No JVD.  Cardiovascular:     Rate and Rhythm: Normal rate and regular rhythm.     Pulses:          Carotid pulses are 2+ on the right side and 2+ on the left side.      Femoral pulses are 2+ on the right side with bruit and 1+ on the left side with bruit.      Popliteal pulses are 2+ on the right side and 2+ on the left side.       Dorsalis pedis pulses are 0 on the right side and 1+ on the left side.       Posterior tibial pulses are 1+ on the right side and 2+ on the left side.     Heart sounds: Normal heart sounds. No murmur heard.  No gallop.      Comments: Loud bifemoral bruit present. Loss of hair. Warm extremity.  No leg edema.  No JVD.  Pulmonary:     Effort: Pulmonary effort is normal.     Breath sounds: No wheezing or rales.  Abdominal:     General: Bowel sounds are normal.     Palpations: Abdomen is soft.  Neurological:     Mental Status: He is alert.    Laboratory examination:   Recent Labs    06/11/19 1110 07/15/19 1346 07/21/19 0358  NA 137 142 139  K  4.8 4.6 3.8  CL 104 105 113*  CO2 21 22 20*  GLUCOSE 91 83 94  BUN 18 12 11   CREATININE 1.07 0.83 0.90  CALCIUM 9.9 9.9 7.9*  GFRNONAA 68 87 >60  GFRAA 79 100 >60   estimated creatinine clearance is 55.6 mL/min (by C-G formula based on SCr of 0.9 mg/dL).  CMP Latest Ref Rng & Units 07/21/2019 07/15/2019 06/11/2019  Glucose 70 - 99 mg/dL 94 83 91  BUN 8 - 23 mg/dL 11 12 18   Creatinine 0.61 - 1.24 mg/dL 8.410.90 6.600.83 6.301.07  Sodium 135 - 145 mmol/L 139 142 137  Potassium  3.5 - 5.1 mmol/L 3.8 4.6 4.8  Chloride 98 - 111 mmol/L 113(H) 105 104  CO2 22 - 32 mmol/L 20(L) 22 21  Calcium 8.9 - 10.3 mg/dL 7.9(L) 9.9 9.9  Total Protein 6.0 - 8.5 g/dL - - -  Total Bilirubin 0.0 - 1.2 mg/dL - - -  Alkaline Phos 39 - 117 IU/L - - -  AST 0 - 40 IU/L - - -  ALT 0 - 44 IU/L - - -   CBC Latest Ref Rng & Units 07/21/2019 07/21/2019 07/20/2019  WBC 4.0 - 10.5 K/uL - 7.1 11.3(H)  Hemoglobin 13.0 - 17.0 g/dL 7.9(L) 7.9(L) 10.2(L)  Hematocrit 39 - 52 % 25.1(L) 25.5(L) 33.4(L)  Platelets 150 - 400 K/uL - 142(L) 163   Lipid Panel     Component Value Date/Time   CHOL 153 12/24/2018 1022   TRIG 90 12/24/2018 1022   HDL 57 12/24/2018 1022   LDLCALC 79 12/24/2018 1022   LDLDIRECT 105 (H) 10/30/2018 0958   HEMOGLOBIN A1C No results found for: HGBA1C, MPG TSH Recent Labs    10/30/18 0958  TSH 1.110    External Labs:  04/12/2019: HDL 57.000 mg 04/12/2019 LDL 75.000 mg 04/12/2019 Cholesterol, total 148.000 mg 04/12/2019 Triglycerides 81.000 mg 04/12/2019  Glucose 103. BUN 15.  Medications and allergies  No Known Allergies   Current Outpatient Medications  Medication Instructions  . acetaminophen (TYLENOL) 1,000 mg, Oral, Every 6 hours PRN  . amLODipine-benazepril (LOTREL) 10-40 MG capsule 1 capsule, Oral, Daily  . aspirin EC 81 mg, Oral, Daily  . brimonidine (ALPHAGAN) 0.2 % ophthalmic solution 1 drop, Right Eye, 2 times daily  . calcium carbonate (TUMS - DOSED IN MG ELEMENTAL CALCIUM) 500 MG chewable  tablet 2 tablets, Oral, Daily PRN  . clopidogrel (PLAVIX) 75 MG tablet TAKE 1 TABLET BY MOUTH  DAILY  . dorzolamide-timolol (COSOPT) 22.3-6.8 MG/ML ophthalmic solution 1 drop, Right Eye, 2 times daily  . Ferrous Sulfate Dried (FEOSOL) 200 (65 Fe) MG TABS 2 tablets, Oral, Daily before breakfast  . gabapentin (NEURONTIN) 300 mg, Oral, 2 times daily  . ketorolac (ACULAR) 0.5 % ophthalmic solution 1 drop, Right Eye, 4 times daily  . latanoprost (XALATAN) 0.005 % ophthalmic solution 1 drop, Right Eye, Daily at bedtime  . Menthol-Methyl Salicylate (MUSCLE RUB) 10-15 % CREA 1 application, Topical, As needed  . nitroGLYCERIN (NITROSTAT) 0.4 MG SL tablet DISSOLVE ONE TABLET UNDER THE TONGUE EVERY 5 MINUTES AS NEEDED FOR CHEST PAIN.  DO NOT EXCEED A TOTAL OF 3 DOSES IN 15 MINUTES  . rosuvastatin (CRESTOR) 20 mg, Oral, Daily   Radiology:   CT Angio Chest 02/17/2016: 1. No evidence of aortic dissection. No evidence of aneurysmaldilatation. 2. No evidence of significant pulmonary embolus. 3. Scattered calcific atherosclerotic disease along the abdominal aorta and its branches, with likely mild to moderate luminal narrowing along the external and internal iliac arteries bilaterally. 4. **An incidental finding of potential clinical significance has been found. 1.9 cm hypodense nodule at the right thyroid lobe. 5. Scattered coronary artery calcifications seen. 6. Minimal emphysema at the lung apices.  Lungs otherwise clear. 7. Small umbilical hernia, containing only fat. 8. Mild degenerative change along the lumbar spine.  Cardiac Studies:   Exercise Myoview stress test 03/23/2018: 1. The patient performed treadmill exercise using Bruce protocol, completing 5:50 minutes. The patient completed an estimated workload of 7 METS, reaching 83% of the maximum predicted heart rate. Exercise capacity was low. Hemodynamic response was normal. No stress symptoms  reported. No ischemic changes seen on stress  electrocardiogram.  Marginally sub-maximal stress test. 2.  The overall quality of the study is good. There is no evidence of abnormal lung activity. Stress and rest SPECT images demonstrate homogeneous tracer distribution throughout the myocardium. Gated SPECT imaging reveals normal myocardial thickening and wall motion. The left ventricular ejection fraction was calculated 46%, although visually appears normal.    3. Low risk study. Marginally sub-maximal study. Clinical correlation recommended.   Peripheral arteriogram 06/15/19: Abdominal aortogram:  No evidence of abdominal aneurysm. 2 renal arteries one on either side and they're widely patent.  Aortoiliac bifurcation was widely patent.  There was diffuse calcification of the abdominal aorta.  No aneurysm identified. Right iliac artery and right common femoral artery widely patent.  Right SFA has severe calcification and there is a high-grade proximal 90% stenosis followed by skipped multiple 80 to 90% stenosis and distal right SFA has a 30 to 35 mm of 80% stenosis.  Two-vessel runoff in the form of peroneal and PT on the right.  Left common and external iliac artery has a 60 to 70% stenosis, there was a 25 mmHg pressure gradient. SP self-expanding absolute Pro 9.0 x 60 mm stent deployed and postdilated with a 8.0 x 40 mm Mustang balloon. Angioplasty performed 03/15/2014 Left external iliac artery 9.0 x 60, 9.0 x 20 mm self-expanding stent is patent.  Left SFA has focal restenosis from 2017 both in the left distal SFA and left proximal to mid segment of the popliteal artery SP chocolate balloon angioplasty with a 5 mm x 40 mm chocolate balloon and the left distal proximal to mid popliteal artery angioplasty second inflation was performed with a 5 mm x 40 mm Mustang balloon with less than 5% residual stenosis and brisk flow.  Orbital atherectomy site in the left distal SFA is widely patent from two thousand sixteen.  Peripheral arteriogram and  angioplasty of the right mid SFA and right popliteal artery 07/20/2019: Successful orbital arthrectomy of the right mid SFA and right proximal popliteal artery with a 2.0 solid crown CSI diamondback catheter followed by plain balloon angioplasty with a 5.0 x 40 mm Jade in the right proximal popliteal artery and drug-coated balloon, 5.0 x 150 mm Ranger into the mid right SFA, stenosis reduced from 90% in the mid right SFA to less than 10% and similarly from 90% calcific stenosis in the right popliteal artery to less than 10% with smooth flow and without edge dissection.   ABI 08/02/2019:  This exam reveals moderately decreased perfusion of the right lower extremity, noted at the dorsalis pedis and post tibial artery level (ABI 0.65) and moderately decreased perfusion of the left lower extremity, noted at the dorsalis pedis and post tibial artery level (ABI 0.65).  Compared to 11/13/2018, right ABI 0.72 and left ABI 0.48.  EKG   EKG 06/23/2019: Marked sinus bradycardia at rate of 44 bpm, borderline criteria for left renal enlargement otherwise normal EKG. No significant change from EKG 05/20/2019, 08/12/2018.   Assessment     ICD-10-CM   1. Essential hypertension, benign  I10 amLODipine-benazepril (LOTREL) 10-40 MG capsule  2. PVD (peripheral vascular disease) (HCC)  I73.9 PCV ANKLE BRACHIAL INDEX (ABI)  3. Blood loss anemia  D50.0 Ferrous Sulfate Dried (FEOSOL) 200 (65 Fe) MG TABS    CBC    Meds ordered this encounter  Medications  . amLODipine-benazepril (LOTREL) 10-40 MG capsule    Sig: Take 1 capsule by mouth daily.  Dispense:  90 capsule    Refill:  3  . Ferrous Sulfate Dried (FEOSOL) 200 (65 Fe) MG TABS    Sig: Take 2 tablets by mouth daily before breakfast.    Dispense:  60 tablet    Refill:  0   Medications Discontinued During This Encounter  Medication Reason  . amLODipine-benazepril (LOTREL) 10-40 MG capsule Reorder    Recommendations:   DONOVIN KRAEMER  is a 74 y.o.  AAM male   with PAD, hyperlipidemia, tobacco use disorder which he has quit in May 2017. He underwent peripheral arteriogram on 06/15/2018 and angioplasty to left external iliac artery with implantation of self-expanding stent just distal to the previously placed stent from twenty sixteen.  He also underwent focal stenosis balloon angioplasty of the left proximal SFA.  Left distal SFA orbital atherectomy site from twenty seventeen is widely patent.  He also underwent repeat right femoral arteriogram and angioplasty and atherectomy for right mid SFA and right popliteal artery.  Since then he has noticed significant improvement in symptoms of claudication in bilateral lower extremity.  I will restart amlodipine/benazepril, his blood pressure is elevated today.  It was held after second procedure as his blood pressure was low after hemorrhagic complication with hematuria.  He has not had any further hematuria and advised patient and his wife to watch out for any pink urine and have a low threshold to refer him for urology evaluation to exclude any pathology.  Symptoms of claudication have improved since angioplasty, continue aspirin and Plavix for now.  I will repeat ABI in 3 months and see him back at that time.  Groin site has healed well.  Due to blood loss anemia I prescribed him Feosol.  I will obtain CBC prior to his next office visit in 3 months.   Yates Decamp, MD, [facility] 08/05/2019, 9:39 PM Office: [phone]

## 2019-08-06 DIAGNOSIS — Z87891 Personal history of nicotine dependence: Secondary | ICD-10-CM | POA: Diagnosis not present

## 2019-08-06 DIAGNOSIS — I739 Peripheral vascular disease, unspecified: Secondary | ICD-10-CM | POA: Diagnosis not present

## 2019-08-06 DIAGNOSIS — I209 Angina pectoris, unspecified: Secondary | ICD-10-CM | POA: Diagnosis not present

## 2019-08-06 DIAGNOSIS — I10 Essential (primary) hypertension: Secondary | ICD-10-CM | POA: Diagnosis not present

## 2019-08-09 DIAGNOSIS — Z0000 Encounter for general adult medical examination without abnormal findings: Secondary | ICD-10-CM | POA: Diagnosis not present

## 2019-08-09 DIAGNOSIS — D649 Anemia, unspecified: Secondary | ICD-10-CM | POA: Diagnosis not present

## 2019-08-09 DIAGNOSIS — R001 Bradycardia, unspecified: Secondary | ICD-10-CM | POA: Diagnosis not present

## 2019-08-09 DIAGNOSIS — I10 Essential (primary) hypertension: Secondary | ICD-10-CM | POA: Diagnosis not present

## 2019-08-09 DIAGNOSIS — I739 Peripheral vascular disease, unspecified: Secondary | ICD-10-CM | POA: Diagnosis not present

## 2019-08-09 DIAGNOSIS — E785 Hyperlipidemia, unspecified: Secondary | ICD-10-CM | POA: Diagnosis not present

## 2019-08-11 ENCOUNTER — Encounter (HOSPITAL_COMMUNITY): Payer: Self-pay | Admitting: Cardiology

## 2019-08-30 DIAGNOSIS — H401113 Primary open-angle glaucoma, right eye, severe stage: Secondary | ICD-10-CM | POA: Diagnosis not present

## 2019-10-19 DIAGNOSIS — H35351 Cystoid macular degeneration, right eye: Secondary | ICD-10-CM | POA: Diagnosis not present

## 2019-10-19 DIAGNOSIS — H44522 Atrophy of globe, left eye: Secondary | ICD-10-CM | POA: Diagnosis not present

## 2019-10-19 DIAGNOSIS — H4051X3 Glaucoma secondary to other eye disorders, right eye, severe stage: Secondary | ICD-10-CM | POA: Diagnosis not present

## 2019-11-01 ENCOUNTER — Other Ambulatory Visit: Payer: Medicare Other

## 2019-11-08 ENCOUNTER — Ambulatory Visit: Payer: Medicare Other | Admitting: Cardiology

## 2019-11-08 DIAGNOSIS — H44522 Atrophy of globe, left eye: Secondary | ICD-10-CM | POA: Diagnosis not present

## 2019-11-08 DIAGNOSIS — H4051X3 Glaucoma secondary to other eye disorders, right eye, severe stage: Secondary | ICD-10-CM | POA: Diagnosis not present

## 2019-11-08 DIAGNOSIS — H35351 Cystoid macular degeneration, right eye: Secondary | ICD-10-CM | POA: Diagnosis not present

## 2019-11-08 DIAGNOSIS — D500 Iron deficiency anemia secondary to blood loss (chronic): Secondary | ICD-10-CM | POA: Diagnosis not present

## 2019-11-09 DIAGNOSIS — I10 Essential (primary) hypertension: Secondary | ICD-10-CM | POA: Diagnosis not present

## 2019-11-09 DIAGNOSIS — J449 Chronic obstructive pulmonary disease, unspecified: Secondary | ICD-10-CM | POA: Diagnosis not present

## 2019-11-09 DIAGNOSIS — I739 Peripheral vascular disease, unspecified: Secondary | ICD-10-CM | POA: Diagnosis not present

## 2019-11-09 DIAGNOSIS — Z23 Encounter for immunization: Secondary | ICD-10-CM | POA: Diagnosis not present

## 2019-11-09 DIAGNOSIS — D649 Anemia, unspecified: Secondary | ICD-10-CM | POA: Diagnosis not present

## 2019-11-09 LAB — CBC
Hematocrit: 44 % (ref 37.5–51.0)
Hemoglobin: 14 g/dL (ref 13.0–17.7)
MCH: 29.6 pg (ref 26.6–33.0)
MCHC: 31.8 g/dL (ref 31.5–35.7)
MCV: 93 fL (ref 79–97)
Platelets: 230 10*3/uL (ref 150–450)
RBC: 4.73 x10E6/uL (ref 4.14–5.80)
RDW: 14.1 % (ref 11.6–15.4)
WBC: 6.4 10*3/uL (ref 3.4–10.8)

## 2019-11-10 DIAGNOSIS — Z23 Encounter for immunization: Secondary | ICD-10-CM | POA: Diagnosis not present

## 2019-11-11 ENCOUNTER — Encounter: Payer: Self-pay | Admitting: Cardiology

## 2019-11-11 ENCOUNTER — Ambulatory Visit: Payer: Medicare Other | Admitting: Cardiology

## 2019-11-11 ENCOUNTER — Other Ambulatory Visit: Payer: Self-pay

## 2019-11-11 VITALS — BP 130/71 | HR 58 | Resp 16 | Ht 62.0 in | Wt 129.0 lb

## 2019-11-11 DIAGNOSIS — I739 Peripheral vascular disease, unspecified: Secondary | ICD-10-CM

## 2019-11-11 DIAGNOSIS — E7800 Pure hypercholesterolemia, unspecified: Secondary | ICD-10-CM | POA: Diagnosis not present

## 2019-11-11 DIAGNOSIS — I10 Essential (primary) hypertension: Secondary | ICD-10-CM

## 2019-11-11 DIAGNOSIS — G6289 Other specified polyneuropathies: Secondary | ICD-10-CM | POA: Diagnosis not present

## 2019-11-11 NOTE — Progress Notes (Signed)
Primary Physician/Referring:  [name], MD  [name], male    DOB: 1945/05/12, 74 y.o.   MRN: [id number]  Chief Complaint  Patient presents with  . Hypertension  . PAD  . Follow-up    3 month    HPI:    SAMMY CASSAR  is a 74 y.o. AAM male  with PAD, hyperlipidemia, tobacco use disorder which he has quit in May 2017. He underwent peripheral arteriogram on 06/15/2018 and angioplasty to left external iliac artery with implantation of self-expanding stent just distal to the previously placed stent from 2016.  He also underwent focal stenosis balloon angioplasty of the left proximal SFA.  Left distal SFA orbital atherectomy site from twenty seventeen is widely patent.  He also underwent repeat right femoral arteriogram and angioplasty and atherectomy for right mid SFA and right popliteal artery.    Symptoms of claudication have improved since angioplasty.  He has resumed all his activity.  However he states that he has tingling and numbness in his feet and also in the back.  It is not lifestyle limiting.  Denies any rest pain.  Denies chest pain, no dyspnea.  He has remained abstinent from tobacco.  Past Medical History:  Diagnosis Date  . Glaucoma 02/25/2018  . Hyperlipidemia   . Hypertension   . PAD (peripheral artery disease) (HCC)   . Sinus bradycardia on ECG 02/25/2018   EKG 10/12/2015: Marked sinus bradycardia at rate of 44 bpm, normal axis. No evidence of ischemia otherwise normal EKG. No significant change from EKG 04/19/2015: Normal sinus rhythm at rate of 52 bpm, EKG 12/24/2013: Marked sinus bradycardia at the rate of 39 bpm.  . Tobacco use disorder, severe, dependence 02/25/2018   Past Surgical History:  Procedure Laterality Date  . INSERTION OF ILIAC STENT  03/15/2014   Procedure: INSERTION OF ILIAC STENT;  Surgeon: Yates Decamp, MD;  Location: [facility] CATH LAB;  Service: Cardiovascular;;  LEIA   . LOWER EXTREMITY ANGIOGRAM N/A 03/15/2014   Procedure: LOWER EXTREMITY  ANGIOGRAM;  Surgeon: Yates Decamp, MD;  Location: [facility] CATH LAB;  Service: Cardiovascular;  Laterality: N/A;  . LOWER EXTREMITY ANGIOGRAM Right 04/26/2014   Procedure: LOWER EXTREMITY ANGIOGRAM;  Surgeon: Yates Decamp, MD;  Location: [facility] CATH LAB;  Service: Cardiovascular;  Laterality: Right;  . LOWER EXTREMITY ANGIOGRAPHY Bilateral 01/28/2017   Procedure: LOWER EXTREMITY ANGIOGRAPHY;  Surgeon: Yates Decamp, MD;  Location: [facility] INVASIVE CV LAB;  Service: Cardiovascular;  Laterality: Bilateral;  . LOWER EXTREMITY ANGIOGRAPHY N/A 06/15/2019   Procedure: LOWER EXTREMITY ANGIOGRAPHY;  Surgeon: Yates Decamp, MD;  Location: [facility] INVASIVE CV LAB;  Service: Cardiovascular;  Laterality: N/A;  . PERIPHERAL VASCULAR ATHERECTOMY  07/20/2019   Procedure: PERIPHERAL VASCULAR ATHERECTOMY;  Surgeon: Yates Decamp, MD;  Location: [facility] INVASIVE CV LAB;  Service: Cardiovascular;;  Rt SFA  . PERIPHERAL VASCULAR BALLOON ANGIOPLASTY  06/15/2019   Procedure: PERIPHERAL VASCULAR BALLOON ANGIOPLASTY;  Surgeon: Yates Decamp, MD;  Location: [facility] INVASIVE CV LAB;  Service: Cardiovascular;;  Left SFA  . PERIPHERAL VASCULAR CATHETERIZATION Bilateral 05/16/2015   Procedure: Lower Extremity Angiography;  Surgeon: Yates Decamp, MD;  Location: [facility] INVASIVE CV LAB;  Service: Cardiovascular;  Laterality: Bilateral;  . PERIPHERAL VASCULAR CATHETERIZATION Right 05/16/2015   Procedure: Peripheral Vascular Atherectomy;  Surgeon: Yates Decamp, MD;  Location: [facility] INVASIVE CV LAB;  Service: Cardiovascular;  Laterality: Right;  sfa  . PERIPHERAL VASCULAR INTERVENTION  06/15/2019   Procedure: PERIPHERAL VASCULAR INTERVENTION;  Surgeon: Yates Decamp, MD;  Location:  [facility] INVASIVE CV LAB;  Service: Cardiovascular;;  LEIA stent  . REFRACTIVE SURGERY     Right Eye   Family History  Problem Relation Age of Onset  . Hypertension Mother   . Hyperlipidemia Mother   . Heart disease Mother    Social History   Tobacco Use  . Smoking status: Former Smoker    Packs/day: 1.00    Years: 15.00     Pack years: 15.00    Types: Cigarettes    Quit date: 07/17/2015    Years since quitting: 4.3  . Smokeless tobacco: Never Used  Substance Use Topics  . Alcohol use: No   Marital Status: Married ROS  Review of Systems  Cardiovascular: Positive for claudication and dyspnea on exertion. Negative for chest pain and leg swelling.  Musculoskeletal: Positive for arthritis and back pain.  Gastrointestinal: Negative for heartburn and melena.  Neurological: Positive for paresthesias.   Objective   Vitals with BMI 11/11/2019 08/05/2019 07/21/2019  Height 5\' 2"  5\' 2"  -  Weight 129 lbs 132 lbs 6 oz -  BMI 23.59 24.21 -  Systolic 130 151 [id number]  Diastolic 71 79 56  Pulse 58 55 60    Physical Exam Constitutional:      General: He is not in acute distress.    Appearance: He is well-developed.     Comments: petite  Neck:     Thyroid: No thyromegaly.     Vascular: No JVD.  Cardiovascular:     Rate and Rhythm: Normal rate and regular rhythm.     Pulses:          Carotid pulses are 2+ on the right side and 2+ on the left side.      Femoral pulses are 2+ on the right side with bruit and 1+ on the left side with bruit.      Popliteal pulses are 2+ on the right side and 2+ on the left side.       Dorsalis pedis pulses are 0 on the right side and 1+ on the left side.       Posterior tibial pulses are 1+ on the right side and 2+ on the left side.     Heart sounds: Normal heart sounds. No murmur heard.  No gallop.      Comments: Loud bifemoral bruit present. Loss of hair. Warm extremity.  No leg edema.  No JVD.  Pulmonary:     Effort: Pulmonary effort is normal.     Breath sounds: No wheezing or rales.  Abdominal:     General: Bowel sounds are normal.     Palpations: Abdomen is soft.  Neurological:     Mental Status: He is alert.    Laboratory examination:   Recent Labs    06/11/19 1110 07/15/19 1346 07/21/19 0358  NA 137 142 139  K 4.8 4.6 3.8  CL 104 105 113*  CO2 21 22 20*   GLUCOSE 91 83 94  BUN 18 12 11   CREATININE 1.07 0.83 0.90  CALCIUM 9.9 9.9 7.9*  GFRNONAA 68 87 >60  GFRAA 79 100 >60   CrCl cannot be calculated (Patient's most recent lab result is older than the maximum 21 days allowed.).  CMP Latest Ref Rng & Units 07/21/2019 07/15/2019 06/11/2019  Glucose 70 - 99 mg/dL 94 83 91  BUN 8 - 23 mg/dL 11 12 18   Creatinine 0.61 - 1.24 mg/dL 2.950.90 2.840.83 1.321.07  Sodium 135 - 145 mmol/L 139 142 137  Potassium  3.5 - 5.1 mmol/L 3.8 4.6 4.8  Chloride 98 - 111 mmol/L 113(H) 105 104  CO2 22 - 32 mmol/L 20(L) 22 21  Calcium 8.9 - 10.3 mg/dL 7.9(L) 9.9 9.9  Total Protein 6.0 - 8.5 g/dL - - -  Total Bilirubin 0.0 - 1.2 mg/dL - - -  Alkaline Phos 39 - 117 IU/L - - -  AST 0 - 40 IU/L - - -  ALT 0 - 44 IU/L - - -   CBC Latest Ref Rng & Units 11/08/2019 07/21/2019 07/21/2019  WBC 3.4 - 10.8 x10E3/uL 6.4 - 7.1  Hemoglobin 13.0 - 17.7 g/dL 16.1 0.9(U) 7.9(L)  Hematocrit 37.5 - 51.0 % 44.0 25.1(L) 25.5(L)  Platelets 150 - 450 x10E3/uL 230 - 142(L)   Lipid Panel     Component Value Date/Time   CHOL 153 12/24/2018 1022   TRIG 90 12/24/2018 1022   HDL 57 12/24/2018 1022   LDLCALC 79 12/24/2018 1022   LDLDIRECT 105 (H) 10/30/2018 0958   External Labs:  Cholesterol, total 139.000 m 08/09/2019 HDL 46.000 mg 08/09/2019 LDL 75.000 mg 08/09/2019 Triglycerides 98.000 mg 08/09/2019   TSH 1.110 10/30/2018   Medications and allergies  No Known Allergies   Current Outpatient Medications  Medication Instructions  . acetaminophen (TYLENOL) 1,000 mg, Oral, Every 6 hours PRN  . amLODipine-benazepril (LOTREL) 10-40 MG capsule 1 capsule, Oral, Daily  . aspirin EC 81 mg, Oral, Daily  . brimonidine (ALPHAGAN) 0.2 % ophthalmic solution 1 drop, Right Eye, 2 times daily  . calcium carbonate (TUMS - DOSED IN MG ELEMENTAL CALCIUM) 500 MG chewable tablet 2 tablets, Oral, Daily PRN  . clopidogrel (PLAVIX) 75 MG tablet TAKE 1 TABLET BY MOUTH  DAILY  . dorzolamide-timolol (COSOPT) 22.3-6.8  MG/ML ophthalmic solution 1 drop, Right Eye, 2 times daily  . Ferrous Sulfate Dried (FEOSOL) 200 (65 Fe) MG TABS 2 tablets, Oral, Daily before breakfast  . gabapentin (NEURONTIN) 300 mg, Oral, 3 times daily, Try three times a day to see if symptoms of leg pain improves  . ketorolac (ACULAR) 0.5 % ophthalmic solution 1 drop, Right Eye, 4 times daily  . latanoprost (XALATAN) 0.005 % ophthalmic solution 1 drop, Right Eye, Daily at bedtime  . Menthol-Methyl Salicylate (MUSCLE RUB) 10-15 % CREA 1 application, Topical, As needed  . nitroGLYCERIN (NITROSTAT) 0.4 MG SL tablet DISSOLVE ONE TABLET UNDER THE TONGUE EVERY 5 MINUTES AS NEEDED FOR CHEST PAIN.  DO NOT EXCEED A TOTAL OF 3 DOSES IN 15 MINUTES  . rosuvastatin (CRESTOR) 20 mg, Oral, Daily   Radiology:   CT Angio Chest 02/17/2016: 1. No evidence of aortic dissection. No evidence of aneurysmaldilatation. 2. No evidence of significant pulmonary embolus. 3. Scattered calcific atherosclerotic disease along the abdominal aorta and its branches, with likely mild to moderate luminal narrowing along the external and internal iliac arteries bilaterally. 4. **An incidental finding of potential clinical significance has been found. 1.9 cm hypodense nodule at the right thyroid lobe. 5. Scattered coronary artery calcifications seen. 6. Minimal emphysema at the lung apices.  Lungs otherwise clear. 7. Small umbilical hernia, containing only fat. 8. Mild degenerative change along the lumbar spine.  Cardiac Studies:   Exercise Myoview stress test 03/23/2018: 1. The patient performed treadmill exercise using Bruce protocol, completing 5:50 minutes. The patient completed an estimated workload of 7 METS, reaching 83% of the maximum predicted heart rate. Exercise capacity was low. Hemodynamic response was normal. No stress symptoms reported. No ischemic changes seen on stress electrocardiogram.  Marginally sub-maximal stress test. 2.  The overall quality of the  study is good. There is no evidence of abnormal lung activity. Stress and rest SPECT images demonstrate homogeneous tracer distribution throughout the myocardium. Gated SPECT imaging reveals normal myocardial thickening and wall motion. The left ventricular ejection fraction was calculated 46%, although visually appears normal.    3. Low risk study. Marginally sub-maximal study. Clinical correlation recommended.   Peripheral arteriogram 06/15/19: Abdominal aortogram:  No evidence of abdominal aneurysm. 2 renal arteries one on either side and they're widely patent.  Aortoiliac bifurcation was widely patent.  There was diffuse calcification of the abdominal aorta.  No aneurysm identified. Right iliac artery and right common femoral artery widely patent.  Right SFA has severe calcification and there is a high-grade proximal 90% stenosis followed by skipped multiple 80 to 90% stenosis and distal right SFA has a 30 to 35 mm of 80% stenosis.  Two-vessel runoff in the form of peroneal and PT on the right.  Left common and external iliac artery has a 60 to 70% stenosis, there was a 25 mmHg pressure gradient. SP self-expanding absolute Pro 9.0 x 60 mm stent deployed and postdilated with a 8.0 x 40 mm Mustang balloon. Angioplasty performed 03/15/2014 Left external iliac artery 9.0 x 60, 9.0 x 20 mm self-expanding stent is patent.  Left SFA has focal restenosis from 2017 both in the left distal SFA and left proximal to mid segment of the popliteal artery SP chocolate balloon angioplasty with a 5 mm x 40 mm chocolate balloon and the left distal proximal to mid popliteal artery angioplasty second inflation was performed with a 5 mm x 40 mm Mustang balloon with less than 5% residual stenosis and brisk flow.  Orbital atherectomy site in the left distal SFA is widely patent from two thousand sixteen.  Peripheral arteriogram and angioplasty of the right mid SFA and right popliteal artery 07/20/2019: Successful orbital  arthrectomy of the right mid SFA and right proximal popliteal artery with a 2.0 solid crown CSI diamondback catheter followed by plain balloon angioplasty with a 5.0 x 40 mm Jade in the right proximal popliteal artery and drug-coated balloon, 5.0 x 150 mm Ranger into the mid right SFA, stenosis reduced from 90% in the mid right SFA to less than 10% and similarly from 90% calcific stenosis in the right popliteal artery to less than 10% with smooth flow and without edge dissection.   ABI 08/02/2019:  This exam reveals moderately decreased perfusion of the right lower extremity, noted at the dorsalis pedis and post tibial artery level (ABI 0.65) and moderately decreased perfusion of the left lower extremity, noted at the dorsalis pedis and post tibial artery level (ABI 0.65).  Compared to 11/13/2018, right ABI 0.72 and left ABI 0.48.  EKG   EKG 06/23/2019: Marked sinus bradycardia at rate of 44 bpm, borderline criteria for left renal enlargement otherwise normal EKG. No significant change from EKG 05/20/2019, 08/12/2018.   Assessment     ICD-10-CM   1. PVD (peripheral vascular disease) (HCC)  I73.9 PCV LOWER ARTERIAL W ABI (UNILATERAL)  2. Essential hypertension, benign  I10   3. Pure hypercholesterolemia  E78.00   4. Peripheral neuropathy due to ischemia  G62.89     No orders of the defined types were placed in this encounter.  There are no discontinued medications.  Recommendations:   DEMITRIUS CRASS  is a 74 y.o.  AAM male  with PAD, hyperlipidemia, tobacco use disorder which he  has quit in May 2017. He underwent peripheral arteriogram on 06/15/2018 and angioplasty to left external iliac artery with implantation of self-expanding stent just distal to the previously placed stent from 2016.  He also underwent focal stenosis balloon angioplasty of the left proximal SFA.  Left distal SFA orbital atherectomy site from twenty seventeen is widely patent.  He also underwent repeat right femoral arteriogram  and angioplasty and atherectomy for right mid SFA and right popliteal artery.    Symptoms of claudication have improved since angioplasty, continue aspirin and Plavix for now.  I will repeat ABI.  In spite of no significant change in his physical exam, his symptoms of chronic pain in the back and also his leg is probably also related to peripheral neuropathy from chronic ischemia.  He is presently on Neurontin 300 mg p.o. twice daily, advised him to increase it to 300 mg p.o. 3 times daily and see if the symptoms would improve.  I reviewed his labs, CBC has now normalized.  He had massive hematuria following peripheral arteriogram and use of heparin.  I reviewed his lipids, LDL is >70 but has not significantly improved since increasing Crestor to 20 mg daily.  I will evaluate him for clinical trials to see if he could enroll him in trial in patients with PAD and hyperlipidemia.  Blood pressure is now well controlled.  No changes in the medications were done today except advising him to increase his Neurontin and if he indeed finds improvement in symptoms, I am happy to prescribe him the medication.  I would like to see him back in 3 months.     Yates Decamp, MD, [facility] 11/11/2019, 4:27 PM Office: [phone]

## 2019-11-15 ENCOUNTER — Other Ambulatory Visit: Payer: Self-pay | Admitting: Cardiology

## 2019-11-17 ENCOUNTER — Ambulatory Visit: Payer: Medicare Other

## 2019-11-17 ENCOUNTER — Other Ambulatory Visit: Payer: Self-pay

## 2019-11-17 DIAGNOSIS — I739 Peripheral vascular disease, unspecified: Secondary | ICD-10-CM

## 2019-11-21 ENCOUNTER — Other Ambulatory Visit: Payer: Self-pay | Admitting: Cardiology

## 2019-11-21 DIAGNOSIS — E7800 Pure hypercholesterolemia, unspecified: Secondary | ICD-10-CM

## 2019-11-23 ENCOUNTER — Other Ambulatory Visit: Payer: Self-pay | Admitting: Cardiology

## 2019-11-23 DIAGNOSIS — E7800 Pure hypercholesterolemia, unspecified: Secondary | ICD-10-CM

## 2019-12-06 ENCOUNTER — Other Ambulatory Visit: Payer: Self-pay | Admitting: Family Medicine

## 2019-12-06 DIAGNOSIS — J439 Emphysema, unspecified: Secondary | ICD-10-CM

## 2019-12-07 DIAGNOSIS — I10 Essential (primary) hypertension: Secondary | ICD-10-CM | POA: Diagnosis not present

## 2019-12-07 DIAGNOSIS — I739 Peripheral vascular disease, unspecified: Secondary | ICD-10-CM | POA: Diagnosis not present

## 2019-12-07 DIAGNOSIS — I209 Angina pectoris, unspecified: Secondary | ICD-10-CM | POA: Diagnosis not present

## 2019-12-07 DIAGNOSIS — Z87891 Personal history of nicotine dependence: Secondary | ICD-10-CM | POA: Diagnosis not present

## 2019-12-22 ENCOUNTER — Inpatient Hospital Stay: Admission: RE | Admit: 2019-12-22 | Payer: Medicare Other | Source: Ambulatory Visit

## 2020-02-05 DIAGNOSIS — I10 Essential (primary) hypertension: Secondary | ICD-10-CM | POA: Diagnosis not present

## 2020-02-05 DIAGNOSIS — I209 Angina pectoris, unspecified: Secondary | ICD-10-CM | POA: Diagnosis not present

## 2020-02-05 DIAGNOSIS — I739 Peripheral vascular disease, unspecified: Secondary | ICD-10-CM | POA: Diagnosis not present

## 2020-02-05 DIAGNOSIS — Z87891 Personal history of nicotine dependence: Secondary | ICD-10-CM | POA: Diagnosis not present

## 2020-02-11 ENCOUNTER — Ambulatory Visit: Payer: Medicare Other | Admitting: Cardiology

## 2020-02-15 DIAGNOSIS — H4051X3 Glaucoma secondary to other eye disorders, right eye, severe stage: Secondary | ICD-10-CM | POA: Diagnosis not present

## 2020-02-15 DIAGNOSIS — H35351 Cystoid macular degeneration, right eye: Secondary | ICD-10-CM | POA: Diagnosis not present

## 2020-02-15 DIAGNOSIS — H44522 Atrophy of globe, left eye: Secondary | ICD-10-CM | POA: Diagnosis not present

## 2020-02-24 ENCOUNTER — Ambulatory Visit: Payer: Medicare Other | Admitting: Cardiology

## 2020-02-24 ENCOUNTER — Other Ambulatory Visit: Payer: Self-pay

## 2020-02-24 ENCOUNTER — Encounter: Payer: Self-pay | Admitting: Cardiology

## 2020-02-24 VITALS — BP 116/63 | HR 54 | Temp 98.5°F | Resp 16 | Ht 62.0 in | Wt 133.6 lb

## 2020-02-24 DIAGNOSIS — I739 Peripheral vascular disease, unspecified: Secondary | ICD-10-CM

## 2020-02-24 DIAGNOSIS — I10 Essential (primary) hypertension: Secondary | ICD-10-CM | POA: Diagnosis not present

## 2020-02-24 DIAGNOSIS — G6289 Other specified polyneuropathies: Secondary | ICD-10-CM | POA: Diagnosis not present

## 2020-02-24 DIAGNOSIS — E7800 Pure hypercholesterolemia, unspecified: Secondary | ICD-10-CM | POA: Diagnosis not present

## 2020-02-24 MED ORDER — CILOSTAZOL 100 MG PO TABS: 100.0000 mg | ORAL_TABLET | Freq: Two times a day (BID) | ORAL | 3 refills | Status: DC

## 2020-02-24 NOTE — Progress Notes (Signed)
Primary Physician/Referring:  [name], MD  [name], male    DOB: [birth year], 75 y.o.   MRN: [id number]  Chief Complaint  Patient presents with  . PAD  . Coronary Artery Disease  . Hyperlipidemia  . Follow-up   HPI:    Timothy Webster  is a 75 y.o. AAM male  with PAD, hyperlipidemia, tobacco use disorder which he has quit in May 2017, PAD with history of left external iliac artery stenting in in 2016 and 2021 and and angioplasty balloon angioplasty of the left proximal SFA.  Left distal SFA orbital atherectomy site from 2017 was widely patent.  He also underwent repeat right femoral arteriogram and angioplasty and atherectomy for right mid SFA and right popliteal artery in July 2021.     This a 59-month office visit, symptoms of claudication in his calves has improved significantly although he still continues to have burning sensation in his legs which is slightly improved being on Neurontin.  He also complains of lower back pain and upper right hip pain that is constant and worse when he walks.  He has not noticed any bluish discoloration or ulceration in his feet or rest pain.  Denies chest pain, no dyspnea.  He has remained abstinent from tobacco.  Past Medical History:  Diagnosis Date  . Glaucoma 02/25/2018  . Hyperlipidemia   . Hypertension   . PAD (peripheral artery disease) (HCC)   . Sinus bradycardia on ECG 02/25/2018   EKG 10/12/2015: Marked sinus bradycardia at rate of 44 bpm, normal axis. No evidence of ischemia otherwise normal EKG. No significant change from EKG 04/19/2015: Normal sinus rhythm at rate of 52 bpm, EKG 12/24/2013: Marked sinus bradycardia at the rate of 39 bpm.  . Tobacco use disorder, severe, dependence 02/25/2018   Past Surgical History:  Procedure Laterality Date  . INSERTION OF ILIAC STENT  03/15/2014   Procedure: INSERTION OF ILIAC STENT;  Surgeon: Yates Decamp, MD;  Location: [facility] CATH LAB;  Service: Cardiovascular;;  LEIA   . LOWER  EXTREMITY ANGIOGRAM N/A 03/15/2014   Procedure: LOWER EXTREMITY ANGIOGRAM;  Surgeon: Yates Decamp, MD;  Location: [facility] CATH LAB;  Service: Cardiovascular;  Laterality: N/A;  . LOWER EXTREMITY ANGIOGRAM Right 04/26/2014   Procedure: LOWER EXTREMITY ANGIOGRAM;  Surgeon: Yates Decamp, MD;  Location: [facility] CATH LAB;  Service: Cardiovascular;  Laterality: Right;  . LOWER EXTREMITY ANGIOGRAPHY Bilateral 01/28/2017   Procedure: LOWER EXTREMITY ANGIOGRAPHY;  Surgeon: Yates Decamp, MD;  Location: [facility] INVASIVE CV LAB;  Service: Cardiovascular;  Laterality: Bilateral;  . LOWER EXTREMITY ANGIOGRAPHY N/A 06/15/2019   Procedure: LOWER EXTREMITY ANGIOGRAPHY;  Surgeon: Yates Decamp, MD;  Location: [facility] INVASIVE CV LAB;  Service: Cardiovascular;  Laterality: N/A;  . PERIPHERAL VASCULAR ATHERECTOMY  07/20/2019   Procedure: PERIPHERAL VASCULAR ATHERECTOMY;  Surgeon: Yates Decamp, MD;  Location: [facility] INVASIVE CV LAB;  Service: Cardiovascular;;  Rt SFA  . PERIPHERAL VASCULAR BALLOON ANGIOPLASTY  06/15/2019   Procedure: PERIPHERAL VASCULAR BALLOON ANGIOPLASTY;  Surgeon: Yates Decamp, MD;  Location: [facility] INVASIVE CV LAB;  Service: Cardiovascular;;  Left SFA  . PERIPHERAL VASCULAR CATHETERIZATION Bilateral 05/16/2015   Procedure: Lower Extremity Angiography;  Surgeon: Yates Decamp, MD;  Location: [facility] INVASIVE CV LAB;  Service: Cardiovascular;  Laterality: Bilateral;  . PERIPHERAL VASCULAR CATHETERIZATION Right 05/16/2015   Procedure: Peripheral Vascular Atherectomy;  Surgeon: Yates Decamp, MD;  Location: [facility] INVASIVE CV LAB;  Service: Cardiovascular;  Laterality: Right;  sfa  . PERIPHERAL VASCULAR INTERVENTION  06/15/2019  Procedure: PERIPHERAL VASCULAR INTERVENTION;  Surgeon: Yates Decamp, MD;  Location: [facility] INVASIVE CV LAB;  Service: Cardiovascular;;  LEIA stent  . REFRACTIVE SURGERY     Right Eye   Family History  Problem Relation Age of Onset  . Hypertension Mother   . Hyperlipidemia Mother   . Heart disease Mother    Social History   Tobacco Use  .  Smoking status: Former Smoker    Packs/day: 1.00    Years: 15.00    Pack years: 15.00    Types: Cigarettes    Quit date: 07/17/2015    Years since quitting: 4.6  . Smokeless tobacco: Never Used  Substance Use Topics  . Alcohol use: No   Marital Status: Married ROS  Review of Systems  Cardiovascular: Positive for claudication and dyspnea on exertion. Negative for chest pain and leg swelling.  Musculoskeletal: Positive for arthritis and back pain.  Gastrointestinal: Negative for heartburn and melena.  Neurological: Positive for paresthesias.   Objective   Vitals with BMI 02/24/2020 11/11/2019 08/05/2019  Height     Weight 133 lbs 10 oz 129 lbs 132 lbs 6 oz  BMI 24.43 23.59 24.21  Systolic 116 130 409  Diastolic 63 71 79  Pulse 54 58 55    Physical Exam Constitutional:      General: He is not in acute distress.    Appearance: He is well-developed.     Comments: petite  Neck:     Thyroid: No thyromegaly.     Vascular: No JVD.  Cardiovascular:     Rate and Rhythm: Normal rate and regular rhythm.     Pulses:          Carotid pulses are 2+ on the right side and 2+ on the left side.      Femoral pulses are 2+ on the right side with bruit and 1+ on the left side with bruit.      Popliteal pulses are 2+ on the right side and 2+ on the left side.       Dorsalis pedis pulses are 0 on the right side and 1+ on the left side.       Posterior tibial pulses are 1+ on the right side and 2+ on the left side.     Heart sounds: Normal heart sounds. No murmur heard. No gallop.      Comments: Loud bifemoral bruit present. Loss of hair. Warm extremity.  No leg edema.  No JVD.  Pulmonary:     Effort: Pulmonary effort is normal.     Breath sounds: No wheezing or rales.  Abdominal:     General: Bowel sounds are normal.     Palpations: Abdomen is soft.  Neurological:     Mental Status: He is alert.    Laboratory examination:   Recent Labs    06/11/19 1110 07/15/19 1346  07/21/19 0358  NA 137 142 139  K 4.8 4.6 3.8  CL 104 105 113*  CO2 21 22 20*  GLUCOSE 91 83 94  BUN CREATININE 1.07 0.83 0.90  CALCIUM 9.9 9.9 7.9*  GFRNONAA 68 87 >60  GFRAA 79 100 >60   CrCl cannot be calculated (Patient's most recent lab result is older than the maximum 21 days allowed.).  CMP Latest Ref Rng & Units 07/21/2019 07/15/2019 06/11/2019  Glucose 70 - 99 mg/dL 94 83 91  BUN 8 - 23 mg/dL Creatinine 0.61 - 1.24 mg/dL  0.90 0.83 1.07  Sodium 135 - 145 mmol/L 139 142 137  Potassium 3.5 - 5.1 mmol/L 3.8 4.6 4.8  Chloride 98 - 111 mmol/L 113(H) 105 104  CO2 22 - 32 mmol/L 20(L) 22 21  Calcium 8.9 - 10.3 mg/dL 7.9(L) 9.9 9.9  Total Protein 6.0 - 8.5 g/dL - - -  Total Bilirubin 0.0 - 1.2 mg/dL - - -  Alkaline Phos 39 - 117 IU/L - - -  AST 0 - 40 IU/L - - -  ALT 0 - 44 IU/L - - -   CBC Latest Ref Rng & Units 11/08/2019 07/21/2019 07/21/2019  WBC 3.4 - 10.8 x10E3/uL 6.4 - 7.1  Hemoglobin 13.0 - 17.7 g/dL 56.2 5.6(L) 7.9(L)  Hematocrit 37.5 - 51.0 % 44.0 25.1(L) 25.5(L)  Platelets 150 - 450 x10E3/uL 230 - 142(L)   Lipid Panel     Component Value Date/Time   CHOL 153 12/24/2018 1022   TRIG 90 12/24/2018 1022   HDL 57 12/24/2018 1022   LDLCALC 79 12/24/2018 1022   LDLDIRECT 105 (H) 10/30/2018 0958   External Labs:  Cholesterol, total 139.000 m 08/09/2019 HDL 46.000 mg 08/09/2019 LDL 75.000 mg 08/09/2019 Triglycerides 98.000 mg 08/09/2019   TSH 1.110 10/30/2018   Medications and allergies  No Known Allergies  Current Outpatient Medications on File Prior to Visit  Medication Sig Dispense Refill  . acetaminophen (TYLENOL) 500 MG tablet Take 1,000 mg by mouth every 6 (six) hours as needed (pain).    Marland Kitchen amLODipine-benazepril (LOTREL) 10-40 MG capsule Take 1 capsule by mouth daily. 90 capsule 3  . aspirin EC 81 MG tablet Take 81 mg by mouth daily.    . brimonidine (ALPHAGAN) 0.2 % ophthalmic solution Place 1 drop into the right eye 2 (two) times daily.    .  calcium carbonate (TUMS - DOSED IN MG ELEMENTAL CALCIUM) 500 MG chewable tablet Chew 2 tablets by mouth daily as needed for indigestion or heartburn.    . dorzolamide-timolol (COSOPT) 22.3-6.8 MG/ML ophthalmic solution Place 1 drop into the right eye 2 (two) times daily.    . Ferrous Sulfate Dried (FEOSOL) 200 (65 Fe) MG TABS Take 2 tablets by mouth daily before breakfast. 60 tablet 0  . gabapentin (NEURONTIN) 300 MG capsule Take 300 mg by mouth in the morning, at noon, and at bedtime. Try three times a day to see if symptoms of leg pain improves    . ketorolac (ACULAR) 0.5 % ophthalmic solution Place 1 drop into the right eye 4 (four) times daily.    Marland Kitchen latanoprost (XALATAN) 0.005 % ophthalmic solution Place 1 drop into the right eye at bedtime.    . Menthol-Methyl Salicylate (MUSCLE RUB) 10-15 % CREA Apply 1 application topically as needed for muscle pain.    . nitroGLYCERIN (NITROSTAT) 0.4 MG SL tablet DISSOLVE ONE TABLET UNDER THE TONGUE EVERY 5 MINUTES AS NEEDED FOR CHEST PAIN.  DO NOT EXCEED A TOTAL OF 3 DOSES IN 15 MINUTES (Patient taking differently: Place 0.4 mg under the tongue every 5 (five) minutes as needed for chest pain.) 25 tablet 0  . rosuvastatin (CRESTOR) 20 MG tablet TAKE 1 TABLET BY MOUTH ONCE DAILY --  DISCONTINUE  PRAVASTATIN 90 tablet 0   No current facility-administered medications on file prior to visit.    Radiology:   CT Angio Chest 02/17/2016: 1. No evidence of aortic dissection. No evidence of aneurysmaldilatation. 2. No evidence of significant pulmonary embolus. 3. Scattered calcific atherosclerotic disease along the abdominal aorta  and its branches, with likely mild to moderate luminal narrowing along the external and internal iliac arteries bilaterally. 4. **An incidental finding of potential clinical significance has been found. 1.9 cm hypodense nodule at the right thyroid lobe. 5. Scattered coronary artery calcifications seen. 6. Minimal emphysema at the lung  apices.  Lungs otherwise clear. 7. Small umbilical hernia, containing only fat. 8. Mild degenerative change along the lumbar spine.  Cardiac Studies:   Exercise Myoview stress test 03/23/2018: 1. The patient performed treadmill exercise using Bruce protocol, completing 5:50 minutes. The patient completed an estimated workload of 7 METS, reaching 83% of the maximum predicted heart rate. Exercise capacity was low. Hemodynamic response was normal. No stress symptoms reported. No ischemic changes seen on stress electrocardiogram.  Marginally sub-maximal stress test. 2.  The overall quality of the study is good. There is no evidence of abnormal lung activity. Stress and rest SPECT images demonstrate homogeneous tracer distribution throughout the myocardium. Gated SPECT imaging reveals normal myocardial thickening and wall motion. The left ventricular ejection fraction was calculated 46%, although visually appears normal.    3. Low risk study. Marginally sub-maximal study. Clinical correlation recommended.   Peripheral arteriogram 06/15/19: Abdominal aortogram:  No evidence of abdominal aneurysm. 2 renal arteries one on either side and they're widely patent.  Aortoiliac bifurcation was widely patent.  There was diffuse calcification of the abdominal aorta.  No aneurysm identified. Right iliac artery and right common femoral artery widely patent.  Right SFA has severe calcification and there is a high-grade proximal 90% stenosis followed by skipped multiple 80 to 90% stenosis and distal right SFA has a 30 to 35 mm of 80% stenosis.  Two-vessel runoff in the form of peroneal and PT on the right.  Left common and external iliac artery has a 60 to 70% stenosis, there was a 25 mmHg pressure gradient. SP self-expanding absolute Pro 9.0 x 60 mm stent deployed and postdilated with a 8.0 x 40 mm Mustang balloon. Angioplasty performed 03/15/2014 Left external iliac artery 9.0 x 60, 9.0 x 20 mm self-expanding stent is  patent.  Left SFA has focal restenosis from 2017 both in the left distal SFA and left proximal to mid segment of the popliteal artery SP chocolate balloon angioplasty with a 5 mm x 40 mm chocolate balloon and the left distal proximal to mid popliteal artery angioplasty second inflation was performed with a 5 mm x 40 mm Mustang balloon with less than 5% residual stenosis and brisk flow.  Orbital atherectomy site in the left distal SFA is widely patent from two thousand sixteen.  Peripheral arteriogram and angioplasty of the right mid SFA and right popliteal artery 07/20/2019: Successful orbital arthrectomy of the right mid SFA and right proximal popliteal artery with a 2.0 solid crown CSI diamondback catheter followed by plain balloon angioplasty with a 5.0 x 40 mm Jade in the right proximal popliteal artery and drug-coated balloon, 5.0 x 150 mm Ranger into the mid right SFA, stenosis reduced from 90% in the mid right SFA to less than 10% and similarly from 90% calcific stenosis in the right popliteal artery to less than 10% with smooth flow and without edge dissection.   ABI 11/17/2019:  This exam reveals moderately decreased perfusion of the right and left lower extremity, noted at the post tibial artery level (ABI 0.65 right and 0.59 left) with markedly abnormal monophasic waveform at the ankle. No significant change from 08/02/2019  EKG    EKG 06/23/2019: Marked sinus bradycardia  at rate of 44 bpm, borderline criteria for left renal enlargement otherwise normal EKG. No significant change from EKG 05/20/2019, 08/12/2018.   Assessment     ICD-10-CM   1. Claudication in peripheral vascular disease (HCC)  I73.9 EKG 12-Lead    cilostazol (PLETAL) 100 MG tablet    PCV LOWER ARTERIAL (BILATERAL)  2. Peripheral neuropathy due to ischemia  G62.89   3. Essential hypertension, benign  I10   4. Pure hypercholesterolemia  E78.00     Meds ordered this encounter  Medications  . cilostazol (PLETAL) 100  MG tablet    Sig: Take 1 tablet (100 mg total) by mouth 2 (two) times daily.    Dispense:  180 tablet    Refill:  3    Discontinue Plavix   Medications Discontinued During This Encounter  Medication Reason  . clopidogrel (PLAVIX) 75 MG tablet Change in therapy    Orders Placed This Encounter  Procedures  . EKG 12-Lead    Recommendations:   Shon HaleWillie L Yokum  is a 75 y.o.  AAM male  with PAD, hyperlipidemia, tobacco use disorder which he has quit in May 2017, PAD with history of left external iliac artery stenting in in 2016 and 2021 and and angioplasty balloon angioplasty of the left proximal SFA.  Left distal SFA orbital atherectomy site from 2017 was widely patent.  He also underwent repeat right femoral arteriogram and angioplasty and atherectomy for right mid SFA and right popliteal artery in July 2021.    Symptoms of claudication have improved since angioplasty, however in view of diffuse disease and mild symptoms of claudication, he may benefit from being on Pletal, will discontinue Plavix and start him on cilostazol 100 mg twice daily.  Continue aspirin 81 mg daily.  I reviewed his external labs, since increasing the dose of Crestor, LDL is at goal.  Labs are stable.  He has not had any anginal symptoms, no dyspnea or clinical symptoms of heart failure.  I will see him back in 6 months for follow-up.  I will repeat lower extremity arterial duplex prior to his next office visit in 6 months.  Patient is aware to contact me if he were to develop rest pain or bruise discoloration in his toes or any ulceration.       Yates DecampJay Meade Hogeland, MD, East Bay Endoscopy CenterFACC 02/24/2020, 2:54 PM Office: [phone]

## 2020-03-06 DIAGNOSIS — H401113 Primary open-angle glaucoma, right eye, severe stage: Secondary | ICD-10-CM | POA: Diagnosis not present

## 2020-04-18 DIAGNOSIS — J449 Chronic obstructive pulmonary disease, unspecified: Secondary | ICD-10-CM | POA: Diagnosis not present

## 2020-04-18 DIAGNOSIS — J432 Centrilobular emphysema: Secondary | ICD-10-CM | POA: Diagnosis not present

## 2020-04-18 DIAGNOSIS — I7389 Other specified peripheral vascular diseases: Secondary | ICD-10-CM | POA: Diagnosis not present

## 2020-04-18 DIAGNOSIS — I2510 Atherosclerotic heart disease of native coronary artery without angina pectoris: Secondary | ICD-10-CM | POA: Diagnosis not present

## 2020-04-18 DIAGNOSIS — E785 Hyperlipidemia, unspecified: Secondary | ICD-10-CM | POA: Diagnosis not present

## 2020-04-18 DIAGNOSIS — I10 Essential (primary) hypertension: Secondary | ICD-10-CM | POA: Diagnosis not present

## 2020-04-18 DIAGNOSIS — D649 Anemia, unspecified: Secondary | ICD-10-CM | POA: Diagnosis not present

## 2020-04-18 DIAGNOSIS — I700 Atherosclerosis of aorta: Secondary | ICD-10-CM | POA: Diagnosis not present

## 2020-05-22 ENCOUNTER — Ambulatory Visit
Admission: RE | Admit: 2020-05-22 | Discharge: 2020-05-22 | Disposition: A | Discharge status: Home / Self Care | Payer: Medicare Other | Source: Ambulatory Visit | Attending: Family Medicine | Admitting: Family Medicine

## 2020-05-22 DIAGNOSIS — Z87891 Personal history of nicotine dependence: Secondary | ICD-10-CM | POA: Diagnosis not present

## 2020-05-22 DIAGNOSIS — J439 Emphysema, unspecified: Secondary | ICD-10-CM

## 2020-05-30 ENCOUNTER — Other Ambulatory Visit: Payer: Self-pay | Admitting: Cardiology

## 2020-05-30 DIAGNOSIS — I209 Angina pectoris, unspecified: Secondary | ICD-10-CM

## 2020-06-06 DIAGNOSIS — I10 Essential (primary) hypertension: Secondary | ICD-10-CM | POA: Diagnosis not present

## 2020-06-06 DIAGNOSIS — I739 Peripheral vascular disease, unspecified: Secondary | ICD-10-CM | POA: Diagnosis not present

## 2020-06-06 DIAGNOSIS — I209 Angina pectoris, unspecified: Secondary | ICD-10-CM | POA: Diagnosis not present

## 2020-06-06 DIAGNOSIS — Z87891 Personal history of nicotine dependence: Secondary | ICD-10-CM | POA: Diagnosis not present

## 2020-06-12 ENCOUNTER — Other Ambulatory Visit: Payer: Self-pay | Admitting: Cardiology

## 2020-06-12 DIAGNOSIS — E7800 Pure hypercholesterolemia, unspecified: Secondary | ICD-10-CM

## 2020-06-20 DIAGNOSIS — H35351 Cystoid macular degeneration, right eye: Secondary | ICD-10-CM | POA: Diagnosis not present

## 2020-06-20 DIAGNOSIS — H44522 Atrophy of globe, left eye: Secondary | ICD-10-CM | POA: Diagnosis not present

## 2020-06-20 DIAGNOSIS — H4051X3 Glaucoma secondary to other eye disorders, right eye, severe stage: Secondary | ICD-10-CM | POA: Diagnosis not present

## 2020-07-05 DIAGNOSIS — M130 Polyarthritis, unspecified: Secondary | ICD-10-CM | POA: Diagnosis not present

## 2020-07-05 DIAGNOSIS — H54413A Blindness right eye category 3, normal vision left eye: Secondary | ICD-10-CM | POA: Diagnosis not present

## 2020-07-05 DIAGNOSIS — I70219 Atherosclerosis of native arteries of extremities with intermittent claudication, unspecified extremity: Secondary | ICD-10-CM | POA: Diagnosis not present

## 2020-07-05 DIAGNOSIS — I10 Essential (primary) hypertension: Secondary | ICD-10-CM | POA: Diagnosis not present

## 2020-07-18 DIAGNOSIS — H5442A3 Blindness left eye category 3, normal vision right eye: Secondary | ICD-10-CM | POA: Diagnosis not present

## 2020-07-18 DIAGNOSIS — I10 Essential (primary) hypertension: Secondary | ICD-10-CM | POA: Diagnosis not present

## 2020-07-18 DIAGNOSIS — J439 Emphysema, unspecified: Secondary | ICD-10-CM | POA: Diagnosis not present

## 2020-07-18 DIAGNOSIS — I2510 Atherosclerotic heart disease of native coronary artery without angina pectoris: Secondary | ICD-10-CM | POA: Diagnosis not present

## 2020-07-18 DIAGNOSIS — J432 Centrilobular emphysema: Secondary | ICD-10-CM | POA: Diagnosis not present

## 2020-07-18 DIAGNOSIS — I7389 Other specified peripheral vascular diseases: Secondary | ICD-10-CM | POA: Diagnosis not present

## 2020-07-18 DIAGNOSIS — R202 Paresthesia of skin: Secondary | ICD-10-CM | POA: Diagnosis not present

## 2020-07-21 ENCOUNTER — Other Ambulatory Visit: Payer: Self-pay | Admitting: Cardiology

## 2020-07-21 DIAGNOSIS — I10 Essential (primary) hypertension: Secondary | ICD-10-CM

## 2020-08-06 DIAGNOSIS — I739 Peripheral vascular disease, unspecified: Secondary | ICD-10-CM | POA: Diagnosis not present

## 2020-08-06 DIAGNOSIS — Z87891 Personal history of nicotine dependence: Secondary | ICD-10-CM | POA: Diagnosis not present

## 2020-08-06 DIAGNOSIS — I10 Essential (primary) hypertension: Secondary | ICD-10-CM | POA: Diagnosis not present

## 2020-08-06 DIAGNOSIS — I209 Angina pectoris, unspecified: Secondary | ICD-10-CM | POA: Diagnosis not present

## 2020-08-15 DIAGNOSIS — R202 Paresthesia of skin: Secondary | ICD-10-CM | POA: Diagnosis not present

## 2020-08-15 DIAGNOSIS — I7389 Other specified peripheral vascular diseases: Secondary | ICD-10-CM | POA: Diagnosis not present

## 2020-08-17 ENCOUNTER — Other Ambulatory Visit: Payer: Self-pay

## 2020-08-17 ENCOUNTER — Ambulatory Visit: Payer: Medicare Other

## 2020-08-17 DIAGNOSIS — I739 Peripheral vascular disease, unspecified: Secondary | ICD-10-CM

## 2020-08-18 ENCOUNTER — Encounter: Payer: Self-pay | Admitting: Orthopaedic Surgery

## 2020-08-24 ENCOUNTER — Ambulatory Visit: Payer: Medicare Other | Admitting: Cardiology

## 2020-08-24 ENCOUNTER — Encounter: Payer: Self-pay | Admitting: Cardiology

## 2020-08-24 ENCOUNTER — Other Ambulatory Visit: Payer: Self-pay

## 2020-08-24 VITALS — BP 129/66 | HR 68 | Temp 97.6°F | Resp 16 | Ht 62.0 in | Wt 127.0 lb

## 2020-08-24 DIAGNOSIS — R001 Bradycardia, unspecified: Secondary | ICD-10-CM

## 2020-08-24 DIAGNOSIS — I10 Essential (primary) hypertension: Secondary | ICD-10-CM | POA: Diagnosis not present

## 2020-08-24 DIAGNOSIS — I739 Peripheral vascular disease, unspecified: Secondary | ICD-10-CM

## 2020-08-24 DIAGNOSIS — E7800 Pure hypercholesterolemia, unspecified: Secondary | ICD-10-CM

## 2020-08-24 DIAGNOSIS — G63 Polyneuropathy in diseases classified elsewhere: Secondary | ICD-10-CM

## 2020-08-24 MED ORDER — GABAPENTIN 300 MG PO CAPS: 300.0000 mg | ORAL_CAPSULE | Freq: Three times a day (TID) | ORAL | 3 refills | Status: DC

## 2020-08-24 MED ORDER — EZETIMIBE 10 MG PO TABS: 10.0000 mg | ORAL_TABLET | Freq: Every day | ORAL | 3 refills | Status: DC

## 2020-08-24 NOTE — Progress Notes (Signed)
Primary Physician/Referring:  [name], MD  [name], male    DOB: 19-Aug-1945, 75 y.o.   MRN: [id number]  Chief Complaint  Patient presents with   PVD (peripheral vascular disease   Claudication   Hypertension   Hyperlipidemia   Follow-up    6 month   HPI:    Timothy Webster  is a 75 y.o. AAM male  with PAD, hyperlipidemia, tobacco use disorder which he has quit in May 2017, PAD with history of left external iliac artery stenting in in 2016 and 2021 and and angioplasty balloon angioplasty of the left proximal SFA.  Left distal SFA orbital atherectomy site from 2017 was widely patent.  He also underwent repeat right femoral arteriogram and angioplasty and atherectomy for right mid SFA and right popliteal artery in July 2021.     This a 41-month office visit, symptoms of claudication in his calves has improved significantly although he still continues to have burning sensation in his legs which is slightly improved being on Neurontin.  However recently Neurontin was discontinued and he was started on isosorbide mononitrate by his PCP to see whether his cramping would improve as he has PAD.  There was no change in symptoms since patient discontinued this.  Patient states that symptoms of claudication are stable and he is still able to do all activities of daily living without having to stop.  Still continues to have significant tingling and numbness and discomfort in his feet since discontinuing Neurontin symptoms are gotten worse.  No rest pain, no open wounds.  Wife present.    Denies chest pain, no dyspnea.  He has remained abstinent from tobacco.  Past Medical History:  Diagnosis Date   Glaucoma 02/25/2018   Hyperlipidemia    Hypertension    PAD (peripheral artery disease) (HCC)    Sinus bradycardia on ECG 02/25/2018   EKG 10/12/2015: Marked sinus bradycardia at rate of 44 bpm, normal axis. No evidence of ischemia otherwise normal EKG. No significant change from EKG  04/19/2015: Normal sinus rhythm at rate of 52 bpm, EKG 12/24/2013: Marked sinus bradycardia at the rate of 39 bpm.   Tobacco use disorder, severe, dependence 02/25/2018   Past Surgical History:  Procedure Laterality Date   INSERTION OF ILIAC STENT  03/15/2014   Procedure: INSERTION OF ILIAC STENT;  Surgeon: Yates Decamp, MD;  Location: [facility] CATH LAB;  Service: Cardiovascular;;  LEIA    LOWER EXTREMITY ANGIOGRAM N/A 03/15/2014   Procedure: LOWER EXTREMITY ANGIOGRAM;  Surgeon: Yates Decamp, MD;  Location: [facility] CATH LAB;  Service: Cardiovascular;  Laterality: N/A;   LOWER EXTREMITY ANGIOGRAM Right 04/26/2014   Procedure: LOWER EXTREMITY ANGIOGRAM;  Surgeon: Yates Decamp, MD;  Location: [facility] CATH LAB;  Service: Cardiovascular;  Laterality: Right;   LOWER EXTREMITY ANGIOGRAPHY Bilateral 01/28/2017   Procedure: LOWER EXTREMITY ANGIOGRAPHY;  Surgeon: Yates Decamp, MD;  Location: [facility] INVASIVE CV LAB;  Service: Cardiovascular;  Laterality: Bilateral;   LOWER EXTREMITY ANGIOGRAPHY N/A 06/15/2019   Procedure: LOWER EXTREMITY ANGIOGRAPHY;  Surgeon: Yates Decamp, MD;  Location: [facility] INVASIVE CV LAB;  Service: Cardiovascular;  Laterality: N/A;   PERIPHERAL VASCULAR ATHERECTOMY  07/20/2019   Procedure: PERIPHERAL VASCULAR ATHERECTOMY;  Surgeon: Yates Decamp, MD;  Location: [facility] INVASIVE CV LAB;  Service: Cardiovascular;;  Rt SFA   PERIPHERAL VASCULAR BALLOON ANGIOPLASTY  06/15/2019   Procedure: PERIPHERAL VASCULAR BALLOON ANGIOPLASTY;  Surgeon: Yates Decamp, MD;  Location: [facility] INVASIVE CV LAB;  Service: Cardiovascular;;  Left SFA  PERIPHERAL VASCULAR CATHETERIZATION Bilateral 05/16/2015   Procedure: Lower Extremity Angiography;  Surgeon: Yates Decamp, MD;  Location: [facility] INVASIVE CV LAB;  Service: Cardiovascular;  Laterality: Bilateral;   PERIPHERAL VASCULAR CATHETERIZATION Right 05/16/2015   Procedure: Peripheral Vascular Atherectomy;  Surgeon: Yates Decamp, MD;  Location: [facility] INVASIVE CV LAB;  Service: Cardiovascular;  Laterality: Right;  sfa   PERIPHERAL  VASCULAR INTERVENTION  06/15/2019   Procedure: PERIPHERAL VASCULAR INTERVENTION;  Surgeon: Yates Decamp, MD;  Location: [facility] INVASIVE CV LAB;  Service: Cardiovascular;;  LEIA stent   REFRACTIVE SURGERY     Right Eye   Family History  Problem Relation Age of Onset   Hypertension Mother    Hyperlipidemia Mother    Heart disease Mother    Social History   Tobacco Use   Smoking status: Former    Packs/day: 1.00    Years: 15.00    Pack years: 15.00    Types: Cigarettes    Quit date: 07/17/2015    Years since quitting: 5.1   Smokeless tobacco: Never  Substance Use Topics   Alcohol use: No   Marital Status: Married ROS  Review of Systems  Cardiovascular:  Positive for claudication and dyspnea on exertion. Negative for chest pain and leg swelling.  Musculoskeletal:  Positive for arthritis and back pain.  Gastrointestinal:  Negative for heartburn and melena.  Neurological:  Positive for paresthesias.  Objective   Vitals with BMI 08/24/2020 02/24/2020 11/11/2019  Height 5\' 2"  5\' 2"  5\' 2"   Weight 127 lbs 133 lbs 10 oz 129 lbs  BMI 23.22 24.43 23.59  Systolic 129 116  Diastolic 66 63 71  Pulse 68 54 58    Physical Exam Constitutional:      General: He is not in acute distress.    Appearance: He is well-developed.     Comments: petite  Neck:     Thyroid: No thyromegaly.     Vascular: No JVD.  Cardiovascular:     Rate and Rhythm: Normal rate and regular rhythm.     Pulses:          Carotid pulses are 2+ on the right side and 2+ on the left side.      Femoral pulses are 2+ on the right side with bruit and 1+ on the left side with bruit.      Popliteal pulses are 2+ on the right side and 2+ on the left side.       Dorsalis pedis pulses are 0 on the right side and 1+ on the left side.       Posterior tibial pulses are 1+ on the right side and 2+ on the left side.     Heart sounds: Normal heart sounds. No murmur heard.   No gallop.     Comments: Loud bifemoral bruit present. Loss  of hair. Warm extremity.  No leg edema.  No JVD.  Pulmonary:     Effort: Pulmonary effort is normal.     Breath sounds: No wheezing or rales.  Abdominal:     General: Bowel sounds are normal.     Palpations: Abdomen is soft.  Neurological:     Mental Status: He is alert.   Laboratory examination:   No results for input(s): NA, K, CL, CO2, GLUCOSE, BUN, CREATININE, CALCIUM, GFRNONAA, GFRAA in the last 8760 hours.  CrCl cannot be calculated (Patient's most recent lab result is older than the maximum 21 days allowed.).  CMP Latest Ref Rng & Units 07/21/2019 07/15/2019  06/11/2019  Glucose 70 - 99 mg/dL 94 83 91  BUN 8 - 23 mg/dL Creatinine 0.61 - 1.24 mg/dL 6.96 2.95 2.84  Sodium 135 - 145 mmol/L 139 142 137  Potassium 3.5 - 5.1 mmol/L 3.8 4.6 4.8  Chloride 98 - 111 mmol/L 113(H) 105 104  CO2 22 - 32 mmol/L 20(L) 22 21  Calcium 8.9 - 10.3 mg/dL 7.9(L) 9.9 9.9  Total Protein 6.0 - 8.5 g/dL - - -  Total Bilirubin 0.0 - 1.2 mg/dL - - -  Alkaline Phos 39 - 117 IU/L - - -  AST 0 - 40 IU/L - - -  ALT 0 - 44 IU/L - - -   CBC Latest Ref Rng & Units 11/08/2019 07/21/2019 07/21/2019  WBC 3.4 - 10.8 x10E3/uL 6.4 - 7.1  Hemoglobin 13.0 - 17.7 g/dL 13.2 4.4(W) 7.9(L)  Hematocrit 37.5 - 51.0 % 44.0 25.1(L) 25.5(L)  Platelets 150 - 450 x10E3/uL 230 - 142(L)   Lipid Panel     Component Value Date/Time   CHOL 153 12/24/2018 1022   TRIG 90 12/24/2018 1022   HDL 57 12/24/2018 1022   LDLCALC 79 12/24/2018 1022   LDLDIRECT 105 (H) 10/30/2018 0958   External Labs:  Cholesterol, total 183.000 m 04/18/2020 HDL 70.000 mg 04/18/2020 LDL 91.000 mg 04/18/2020 Triglycerides 121.000 m 04/18/2020  Hemoglobin 14.000 G/ 11/08/2019  Creatinine, Serum 1.090 mg/ 07/18/2020 Potassium 4.000 mm 07/18/2020 ALT (SGPT) 15.000 U/L 07/18/2020  Cholesterol, total 139.000 m 08/09/2019 HDL 46.000 mg 08/09/2019 LDL 75.000 mg 08/09/2019 Triglycerides 98.000 mg 08/09/2019   TSH 1.110 10/30/2018   Medications and  allergies  No Known Allergies  Current Outpatient Medications on File Prior to Visit  Medication Sig Dispense Refill   acetaminophen (TYLENOL) 500 MG tablet Take 1,000 mg by mouth every 6 (six) hours as needed (pain).     amLODipine-benazepril (LOTREL) 10-40 MG capsule Take 1 capsule by mouth once daily 90 capsule 0   aspirin EC 81 MG tablet Take 81 mg by mouth daily.     brimonidine (ALPHAGAN) 0.2 % ophthalmic solution Place 1 drop into the right eye 2 (two) times daily.     cilostazol (PLETAL) 100 MG tablet Take 1 tablet (100 mg total) by mouth 2 (two) times daily. 180 tablet 3   dorzolamide-timolol (COSOPT) 22.3-6.8 MG/ML ophthalmic solution Place 1 drop into the right eye 2 (two) times daily.     ketorolac (ACULAR) 0.5 % ophthalmic solution Place 1 drop into the right eye 4 (four) times daily.     latanoprost (XALATAN) 0.005 % ophthalmic solution Place 1 drop into the right eye at bedtime.     Menthol-Methyl Salicylate (MUSCLE RUB) 10-15 % CREA Apply 1 application topically as needed for muscle pain.     nitroGLYCERIN (NITROSTAT) 0.4 MG SL tablet DISSOLVE ONE TABLET UNDER THE TONGUE EVERY 5 MINUTES AS NEEDED FOR CHEST PAIN.  DO NOT EXCEED A TOTAL OF 3 DOSES IN 15 MINUTES 25 tablet 0   rosuvastatin (CRESTOR) 20 MG tablet TAKE 1 TABLET BY MOUTH ONCE DAILY (DISCONTINUE  PRAVASTATIN) 90 tablet 0   No current facility-administered medications on file prior to visit.    Radiology:   CT Angio Chest 02/17/2016: 1. No evidence of aortic dissection. No evidence of aneurysmaldilatation. 2. No evidence of significant pulmonary embolus. 3. Scattered calcific atherosclerotic disease along the abdominal aorta and its branches, with likely mild to moderate luminal narrowing along the external and internal iliac arteries bilaterally. 4. **An  incidental finding of potential clinical significance has been found. 1.9 cm hypodense nodule at the right thyroid lobe. 5. Scattered coronary artery calcifications  seen. 6. Minimal emphysema at the lung apices.  Lungs otherwise clear. 7. Small umbilical hernia, containing only fat. 8. Mild degenerative change along the lumbar spine.  Cardiac Studies:   Exercise Myoview stress test 03/23/2018: 1. The patient performed treadmill exercise using Bruce protocol, completing 5:50 minutes. The patient completed an estimated workload of 7 METS, reaching 83% of the maximum predicted heart rate. Exercise capacity was low. Hemodynamic response was normal. No stress symptoms reported. No ischemic changes seen on stress electrocardiogram.  Marginally sub-maximal stress test. 2.  The overall quality of the study is good. There is no evidence of abnormal lung activity. Stress and rest SPECT images demonstrate homogeneous tracer distribution throughout the myocardium. Gated SPECT imaging reveals normal myocardial thickening and wall motion. The left ventricular ejection fraction was calculated 46%, although visually appears normal.    3. Low risk study. Marginally sub-maximal study. Clinical correlation recommended.   Peripheral arteriogram 06/15/19: Abdominal aortogram:  No evidence of abdominal aneurysm. 2 renal arteries one on either side and they're widely patent.  Aortoiliac bifurcation was widely patent.  There was diffuse calcification of the abdominal aorta.  No aneurysm identified. Right iliac artery and right common femoral artery widely patent.  Right SFA has severe calcification and there is a high-grade proximal 90% stenosis followed by skipped multiple 80 to 90% stenosis and distal right SFA has a 30 to 35 mm of 80% stenosis.  Two-vessel runoff in the form of peroneal and PT on the right.  Left common and external iliac artery has a 60 to 70% stenosis, there was a 25 mmHg pressure gradient. SP self-expanding absolute Pro 9.0 x 60 mm stent deployed and postdilated with a 8.0 x 40 mm Mustang balloon. Angioplasty performed 03/15/2014 Left external iliac artery 9.0 x  60, 9.0 x 20 mm self-expanding stent is patent.  Left SFA has focal restenosis from 2017 both in the left distal SFA and left proximal to mid segment of the popliteal artery SP chocolate balloon angioplasty with a 5 mm x 40 mm chocolate balloon and the left distal proximal to mid popliteal artery angioplasty second inflation was performed with a 5 mm x 40 mm Mustang balloon with less than 5% residual stenosis and brisk flow.  Orbital atherectomy site in the left distal SFA is widely patent from two thousand sixteen.   Peripheral arteriogram and angioplasty of the right mid SFA and right popliteal artery 07/20/2019: Successful orbital arthrectomy of the right mid SFA and right proximal popliteal artery with a 2.0 solid crown CSI diamondback catheter followed by plain balloon angioplasty with a 5.0 x 40 mm Jade in the right proximal popliteal artery and drug-coated balloon, 5.0 x 150 mm Ranger into the mid right SFA, stenosis reduced from 90% in the mid right SFA to less than 10% and similarly from 90% calcific stenosis in the right popliteal artery to less than 10% with smooth flow and without edge dissection.   ABI 11/17/2019:  This exam reveals moderately decreased perfusion of the right and left lower extremity, noted at the post tibial artery level (ABI 0.65 right and 0.59 left) with markedly abnormal monophasic waveform at the ankle.  No significant change from 08/02/2019  Lower Extremity Arterial Duplex 08/17/2020: Moderate velocity increase at the right proximal superficial femoral artery suggests >50% stenosis. Moderate velocity increase at the left profunda femoral artery >  50% stenosis. There is diffuse heterogeneous plaque noted in bilateral lower extremity.  This exam reveals moderately decreased perfusion of the right lower extremity, noted at the post tibial artery level (ABI 0.70) and moderately decreased perfusion of the left lower extremity, noted at the post tibial artery level (ABI  0.57).  Compared to 05/26/2019, bilateral ABI 0.67. Right SFA restenosis is new.   EKG   EKG 08/24/2020: Marked sinus bradycardia at rate of 46 bpm, normal axis, no evidence of ischemia. No significant change from EKG 06/23/2019: Marked sinus bradycardia at rate of 44 bpm. No significant change from EKG 05/20/2019, 08/12/2018.   Assessment     ICD-10-CM   1. Claudication in peripheral vascular disease (HCC)  I73.9 EKG 12-Lead    PCV LOWER ARTERIAL (BILATERAL)    2. Pure hypercholesterolemia  E78.00 Lipid Panel With LDL/HDL Ratio    Lipid Panel With LDL/HDL Ratio    3. Sinus bradycardia on ECG  R00.1     4. Essential hypertension, benign  I10     5. Polyneuropathy associated with underlying disease (HCC)  G63 gabapentin (NEURONTIN) 300 MG capsule    DISCONTINUED: gabapentin (NEURONTIN) 300 MG capsule      Meds ordered this encounter  Medications   ezetimibe (ZETIA) 10 MG tablet    Sig: Take 1 tablet (10 mg total) by mouth daily.    Dispense:  90 tablet    Refill:  3   DISCONTD: gabapentin (NEURONTIN) 300 MG capsule    Sig: Take 1 capsule (300 mg total) by mouth in the morning, at noon, and at bedtime. Try three times a day to see if symptoms of leg pain improves    Dispense:  180 capsule    Refill:  3   gabapentin (NEURONTIN) 300 MG capsule    Sig: Take 1 capsule (300 mg total) by mouth in the morning, at noon, and at bedtime. Try three times a day to see if symptoms of leg pain improves    Dispense:  180 capsule    Refill:  3   Medications Discontinued During This Encounter  Medication Reason   calcium carbonate (TUMS - DOSED IN MG ELEMENTAL CALCIUM) 500 MG chewable tablet Error   clopidogrel (PLAVIX) 75 MG tablet Change in therapy   Ferrous Sulfate Dried (FEOSOL) 200 (65 Fe) MG TABS Completed Course   gabapentin (NEURONTIN) 300 MG capsule Reorder   gabapentin (NEURONTIN) 300 MG capsule     Orders Placed This Encounter  Procedures   Lipid Panel With LDL/HDL Ratio     Standing Status:   Future    Number of Occurrences:   1    Standing Expiration Date:   08/24/2021   EKG 12-Lead    Recommendations:   Timothy Webster  is a 75 y.o.  AAM male  with PAD, hyperlipidemia, tobacco use disorder which he has quit in May 2017, PAD with history of left external iliac artery stenting in in 2016 and 2021 and and angioplasty balloon angioplasty of the left proximal SFA.  Left distal SFA orbital atherectomy site from 2017 was widely patent.  He also underwent repeat right femoral arteriogram and angioplasty and atherectomy for right mid SFA and right popliteal artery in July 2021.    Symptoms of claudication have improved since angioplasty, however in view of diffuse disease and mild symptoms of claudication, he may benefit from being on Pletal, will discontinue Plavix, continue aspirin 81 mg daily.  He was tried on isosorbide mononitrate by his  PCP for PAD for leg cramps and also tingling and numbness but symptoms did not improve hence patient has discontinued this.  Request that he go back on gabapentin which I prescribed again, patient has peripheral neuropathy as well due to underlying PAD.  Although ABI has mildly decreased and there is evidence of restenosis in the right SFA, patient's symptoms of claudication are very mild and not lifestyle limiting.  Advised him that as long as he is able to do his activities of daily living without any limitations, able to do things that he wants to do, that we can continue to watch this and only perform repeat angiography if symptoms get worse.  I will repeat lower extremity arterial duplex in 6 months.  I reviewed his external labs, LDL is still not at target, I will add Zetia 10 mg daily, will recheck lipids in 1 month.    He has not had any anginal symptoms, no dyspnea or clinical symptoms of heart failure.  I will see him back in 6 months for follow-up.  I will repeat lower extremity arterial duplex prior to his next office visit in 6  months.   Yates DecampJay Kirsi Hugh, MD, Wilson Medical CenterFACC 08/24/2020, 11:49 AM Office: [phone]

## 2020-09-15 ENCOUNTER — Other Ambulatory Visit: Payer: Self-pay | Admitting: Cardiology

## 2020-09-15 DIAGNOSIS — E7800 Pure hypercholesterolemia, unspecified: Secondary | ICD-10-CM | POA: Diagnosis not present

## 2020-09-16 LAB — LIPID PANEL WITH LDL/HDL RATIO
Cholesterol, Total: 127 mg/dL (ref 100–199)
HDL: 40 mg/dL (ref >39)
LDL Chol Calc (NIH): 70 mg/dL (ref 0–99)
LDL/HDL Ratio: 1.8 ratio (ref 0.0–3.6)
Triglycerides: 88 mg/dL (ref 0–149)
VLDL Cholesterol Cal: 17 mg/dL (ref 5–40)

## 2020-10-06 DIAGNOSIS — I739 Peripheral vascular disease, unspecified: Secondary | ICD-10-CM | POA: Diagnosis not present

## 2020-10-06 DIAGNOSIS — Z87891 Personal history of nicotine dependence: Secondary | ICD-10-CM | POA: Diagnosis not present

## 2020-10-06 DIAGNOSIS — I10 Essential (primary) hypertension: Secondary | ICD-10-CM | POA: Diagnosis not present

## 2020-10-06 DIAGNOSIS — I209 Angina pectoris, unspecified: Secondary | ICD-10-CM | POA: Diagnosis not present

## 2020-10-10 ENCOUNTER — Other Ambulatory Visit: Payer: Self-pay | Admitting: Cardiology

## 2020-10-10 DIAGNOSIS — I10 Essential (primary) hypertension: Secondary | ICD-10-CM

## 2020-10-18 ENCOUNTER — Other Ambulatory Visit: Payer: Self-pay | Admitting: Cardiology

## 2020-10-18 DIAGNOSIS — I209 Angina pectoris, unspecified: Secondary | ICD-10-CM

## 2020-10-19 ENCOUNTER — Telehealth: Payer: Self-pay

## 2020-10-19 NOTE — Telephone Encounter (Signed)
As he is without symptoms please advise patient to continue present medications. Will consider reducing anti-hypertensive meds at appt. He may hold amlodpine/benazapril if Bp is <100 mmHg systolic prior to taking medicaitons. If he develops symptoms please have him hold amlodpie/benazapril

## 2020-10-20 NOTE — Telephone Encounter (Signed)
Patient is aware 

## 2020-10-23 ENCOUNTER — Other Ambulatory Visit: Payer: Self-pay

## 2020-10-23 ENCOUNTER — Ambulatory Visit: Payer: Medicare Other | Admitting: Student

## 2020-10-23 ENCOUNTER — Encounter: Payer: Self-pay | Admitting: Student

## 2020-10-23 VITALS — BP 134/65 | HR 61 | Ht 62.0 in | Wt 130.0 lb

## 2020-10-23 DIAGNOSIS — R001 Bradycardia, unspecified: Secondary | ICD-10-CM | POA: Diagnosis not present

## 2020-10-23 DIAGNOSIS — I10 Essential (primary) hypertension: Secondary | ICD-10-CM | POA: Diagnosis not present

## 2020-10-23 DIAGNOSIS — H401113 Primary open-angle glaucoma, right eye, severe stage: Secondary | ICD-10-CM | POA: Diagnosis not present

## 2020-10-23 DIAGNOSIS — E7800 Pure hypercholesterolemia, unspecified: Secondary | ICD-10-CM | POA: Diagnosis not present

## 2020-10-23 DIAGNOSIS — I739 Peripheral vascular disease, unspecified: Secondary | ICD-10-CM | POA: Diagnosis not present

## 2020-10-23 NOTE — Progress Notes (Signed)
Primary Physician/Referring:  [name], MD  [name], male    DOB: [birth year], 75 y.o.   MRN: [id number]  Chief Complaint  Patient presents with   Hypotension   Bradycardia   HPI:    Timothy Webster  is a 75 y.o. AAM male  with PAD, hyperlipidemia, tobacco use disorder which he has quit in May 2017, PAD with history of left external iliac artery stenting in in 2016 and 2021 and and angioplasty balloon angioplasty of the left proximal SFA.  Left distal SFA orbital atherectomy site from 2017 was widely patent.  He also underwent repeat right femoral arteriogram and angioplasty and atherectomy for right mid SFA and right popliteal artery in July 2021.    Patient presents for 1 year follow-up.  At last office visit added Zetia 10 mg daily and recommended repeat lower extremity arterial duplex.  Repeat lipid profile testing shows LDL is not at goal.  Repeat lower extremity arterial duplex is scheduled for 02/2021.  Patient is feeling well without specific complaints.  Denies chest pain, dyspnea, syncope, near syncope.  Denies orthopnea, PND, leg swelling.  Denies dizziness, lightheadedness.  He does continue mild claudication symptoms, which are stable in his calves.  Patient's primary concern is that he recently had home health evaluation by a nurse from his insurance company who noted his heart rate and blood pressure to be low, unfortunately patient does not recall the particular readings.  Patient does have history of sinus bradycardia which is stable, and he is asymptomatic.  Patient walked in the office in the hallway today with heart rate increasing from 50 bpm at baseline to 67 bpm halfway point and 79 bpm at the end.  Past Medical History:  Diagnosis Date   Glaucoma 02/25/2018   Hyperlipidemia    Hypertension    PAD (peripheral artery disease) (HCC)    Sinus bradycardia on ECG 02/25/2018   EKG 10/12/2015: Marked sinus bradycardia at rate of 44 bpm, normal axis. No  evidence of ischemia otherwise normal EKG. No significant change from EKG 04/19/2015: Normal sinus rhythm at rate of 52 bpm, EKG 12/24/2013: Marked sinus bradycardia at the rate of 39 bpm.   Tobacco use disorder, severe, dependence 02/25/2018   Past Surgical History:  Procedure Laterality Date   INSERTION OF ILIAC STENT  03/15/2014   Procedure: INSERTION OF ILIAC STENT;  Surgeon: Yates Decamp, MD;  Location: [facility] CATH LAB;  Service: Cardiovascular;;  LEIA    LOWER EXTREMITY ANGIOGRAM N/A 03/15/2014   Procedure: LOWER EXTREMITY ANGIOGRAM;  Surgeon: Yates Decamp, MD;  Location: [facility] CATH LAB;  Service: Cardiovascular;  Laterality: N/A;   LOWER EXTREMITY ANGIOGRAM Right 04/26/2014   Procedure: LOWER EXTREMITY ANGIOGRAM;  Surgeon: Yates Decamp, MD;  Location: [facility] CATH LAB;  Service: Cardiovascular;  Laterality: Right;   LOWER EXTREMITY ANGIOGRAPHY Bilateral 01/28/2017   Procedure: LOWER EXTREMITY ANGIOGRAPHY;  Surgeon: Yates Decamp, MD;  Location: [facility] INVASIVE CV LAB;  Service: Cardiovascular;  Laterality: Bilateral;   LOWER EXTREMITY ANGIOGRAPHY N/A 06/15/2019   Procedure: LOWER EXTREMITY ANGIOGRAPHY;  Surgeon: Yates Decamp, MD;  Location: [facility] INVASIVE CV LAB;  Service: Cardiovascular;  Laterality: N/A;   PERIPHERAL VASCULAR ATHERECTOMY  07/20/2019   Procedure: PERIPHERAL VASCULAR ATHERECTOMY;  Surgeon: Yates Decamp, MD;  Location: [facility] INVASIVE CV LAB;  Service: Cardiovascular;;  Rt SFA   PERIPHERAL VASCULAR BALLOON ANGIOPLASTY  06/15/2019   Procedure: PERIPHERAL VASCULAR BALLOON ANGIOPLASTY;  Surgeon: Yates Decamp, MD;  Location: [facility] INVASIVE CV LAB;  Service: Cardiovascular;;  Left SFA   PERIPHERAL VASCULAR CATHETERIZATION Bilateral 05/16/2015   Procedure: Lower Extremity Angiography;  Surgeon: Yates Decamp, MD;  Location: [facility] INVASIVE CV LAB;  Service: Cardiovascular;  Laterality: Bilateral;   PERIPHERAL VASCULAR CATHETERIZATION Right 05/16/2015   Procedure: Peripheral Vascular Atherectomy;  Surgeon: Yates Decamp, MD;  Location: [facility] INVASIVE  CV LAB;  Service: Cardiovascular;  Laterality: Right;  sfa   PERIPHERAL VASCULAR INTERVENTION  06/15/2019   Procedure: PERIPHERAL VASCULAR INTERVENTION;  Surgeon: Yates Decamp, MD;  Location: [facility] INVASIVE CV LAB;  Service: Cardiovascular;;  LEIA stent   REFRACTIVE SURGERY     Right Eye   Family History  Problem Relation Age of Onset   Hypertension Mother    Hyperlipidemia Mother    Heart disease Mother    Social History   Tobacco Use   Smoking status: Former    Packs/day: 1.00    Years: 15.00    Pack years: 15.00    Types: Cigarettes    Quit date: 07/17/2015    Years since quitting: 5.2   Smokeless tobacco: Never  Substance Use Topics   Alcohol use: No   Marital Status: Married ROS  Review of Systems  Constitutional: Negative for malaise/fatigue and weight gain.  Cardiovascular:  Positive for claudication (stable) and dyspnea on exertion (stable). Negative for chest pain, leg swelling, near-syncope, orthopnea, palpitations, paroxysmal nocturnal dyspnea and syncope.  Respiratory:  Negative for shortness of breath.   Musculoskeletal:  Positive for arthritis and back pain.  Gastrointestinal:  Negative for heartburn and melena.  Neurological:  Positive for paresthesias. Negative for dizziness.  Objective   Vitals with BMI 10/23/2020 08/24/2020 02/24/2020  Height 5\' 2"  5\' 2"  5\' 2"   Weight 130 lbs 127 lbs 133 lbs 10 oz  BMI 23.77 23.22 24.43  Systolic 134 129  Diastolic 65 66 63  Pulse 61 68 54    Physical Exam Vitals reviewed.  Constitutional:      General: He is not in acute distress.    Appearance: He is well-developed.     Comments: petite  Neck:     Thyroid: No thyromegaly.     Vascular: No JVD.  Cardiovascular:     Rate and Rhythm: Normal rate and regular rhythm.     Pulses:          Carotid pulses are 2+ on the right side and 2+ on the left side.      Femoral pulses are 2+ on the right side with bruit and 1+ on the left side with bruit.      Popliteal pulses  are 2+ on the right side and 2+ on the left side.       Dorsalis pedis pulses are 0 on the right side and 1+ on the left side.       Posterior tibial pulses are 1+ on the right side and 2+ on the left side.     Heart sounds: Normal heart sounds. No murmur heard.   No gallop.     Comments: Loud bifemoral bruit present. Loss of hair. Warm extremity.  No JVD.  Pulmonary:     Effort: Pulmonary effort is normal.     Breath sounds: No wheezing or rales.  Abdominal:     General: Bowel sounds are normal.     Palpations: Abdomen is soft.  Musculoskeletal:     Right lower leg: No edema.     Left lower leg: No edema.  Neurological:     Mental Status:  He is alert.   Laboratory examination:   No results for input(s): NA, K, CL, CO2, GLUCOSE, BUN, CREATININE, CALCIUM, GFRNONAA, GFRAA in the last 8760 hours.  CrCl cannot be calculated (Patient's most recent lab result is older than the maximum 21 days allowed.).  CMP Latest Ref Rng & Units 07/21/2019 07/15/2019 06/11/2019  Glucose 70 - 99 mg/dL 94 83 91  BUN 8 - 23 mg/dL 11 12 18   Creatinine 0.61 - 1.24 mg/dL 8.67 6.19  Sodium 135 - 145 mmol/L 139 142 137  Potassium 3.5 - 5.1 mmol/L 3.8 4.6 4.8  Chloride 98 - 111 mmol/L 113(H) 105 104  CO2 22 - 32 mmol/L 20(L) 22 21  Calcium 8.9 - 10.3 mg/dL 7.9(L) 9.9 9.9  Total Protein 6.0 - 8.5 g/dL - - -  Total Bilirubin 0.0 - 1.2 mg/dL - - -  Alkaline Phos 39 - 117 IU/L - - -  AST 0 - 40 IU/L - - -  ALT 0 - 44 IU/L - - -   CBC Latest Ref Rng & Units 11/08/2019 07/21/2019 07/21/2019  WBC 3.4 - 10.8 x10E3/uL 6.4 - 7.1  Hemoglobin 13.0 - 17.7 g/dL 07/23/2019 32.6) 7.9(L)  Hematocrit 37.5 - 51.0 % 44.0 25.1(L) 25.5(L)  Platelets 150 - 450 x10E3/uL 230 - 142(L)   Lipid Panel     Component Value Date/Time   CHOL 127 09/15/2020 0917   TRIG 88 09/15/2020 0917   HDL 40 09/15/2020 0917   LDLCALC 70 09/15/2020 0917   LDLDIRECT 105 (H) 10/30/2018 0958   External Labs:  Cholesterol, total 183.000 m  04/18/2020 HDL 70.000 mg 04/18/2020 LDL 91.000 mg 04/18/2020 Triglycerides 121.000 m 04/18/2020  Hemoglobin 14.000 G/ 11/08/2019  Creatinine, Serum 1.090 mg/ 07/18/2020 Potassium 4.000 mm 07/18/2020 ALT (SGPT) 15.000 U/L 07/18/2020  Cholesterol, total 139.000 m 08/09/2019 HDL 46.000 mg 08/09/2019 LDL 75.000 mg 08/09/2019 Triglycerides 98.000 mg 08/09/2019   TSH 1.110 10/30/2018  Allergies  No Known Allergies    Medications Prior to Visit:   Outpatient Medications Prior to Visit  Medication Sig Dispense Refill   acetaminophen (TYLENOL) 500 MG tablet Take 1,000 mg by mouth every 6 (six) hours as needed (pain).     amLODipine-benazepril (LOTREL) 10-40 MG capsule Take 1 capsule by mouth once daily 90 capsule 0   aspirin EC 81 MG tablet Take 81 mg by mouth daily.     brimonidine (ALPHAGAN) 0.2 % ophthalmic solution Place 1 drop into the right eye 2 (two) times daily.     cilostazol (PLETAL) 100 MG tablet Take 1 tablet (100 mg total) by mouth 2 (two) times daily. 180 tablet 3   dorzolamide-timolol (COSOPT) 22.3-6.8 MG/ML ophthalmic solution Place 1 drop into the right eye 2 (two) times daily.     ezetimibe (ZETIA) 10 MG tablet Take 1 tablet (10 mg total) by mouth daily. 90 tablet 3   gabapentin (NEURONTIN) 300 MG capsule Take 1 capsule (300 mg total) by mouth in the morning, at noon, and at bedtime. Try three times a day to see if symptoms of leg pain improves 180 capsule 3   isosorbide dinitrate (ISORDIL) 30 MG tablet Take 30 mg by mouth daily.     ketorolac (ACULAR) 0.5 % ophthalmic solution Place 1 drop into the right eye 4 (four) times daily.     latanoprost (XALATAN) 0.005 % ophthalmic solution Place 1 drop into the right eye at bedtime.     Menthol-Methyl Salicylate (MUSCLE RUB) 10-15 % CREA Apply 1 application topically  as needed for muscle pain.     nitroGLYCERIN (NITROSTAT) 0.4 MG SL tablet DISSOLVE ONE TABLET UNDER THE TONGUE EVERY 5 MINUTES AS NEEDED FOR CHEST PAIN.  DO NOT EXCEED A TOTAL  OF 3 DOSES IN 15 MINUTES 25 tablet 0   rosuvastatin (CRESTOR) 20 MG tablet TAKE 1 TABLET BY MOUTH ONCE DAILY (DISCONTINUE  PRAVASTATIN) 90 tablet 0   No facility-administered medications prior to visit.     Final Medications at End of Visit    Current Meds  Medication Sig   acetaminophen (TYLENOL) 500 MG tablet Take 1,000 mg by mouth every 6 (six) hours as needed (pain).   amLODipine-benazepril (LOTREL) 10-40 MG capsule Take 1 capsule by mouth once daily   aspirin EC 81 MG tablet Take 81 mg by mouth daily.   brimonidine (ALPHAGAN) 0.2 % ophthalmic solution Place 1 drop into the right eye 2 (two) times daily.   cilostazol (PLETAL) 100 MG tablet Take 1 tablet (100 mg total) by mouth 2 (two) times daily.   dorzolamide-timolol (COSOPT) 22.3-6.8 MG/ML ophthalmic solution Place 1 drop into the right eye 2 (two) times daily.   ezetimibe (ZETIA) 10 MG tablet Take 1 tablet (10 mg total) by mouth daily.   gabapentin (NEURONTIN) 300 MG capsule Take 1 capsule (300 mg total) by mouth in the morning, at noon, and at bedtime. Try three times a day to see if symptoms of leg pain improves   isosorbide dinitrate (ISORDIL) 30 MG tablet Take 30 mg by mouth daily.   ketorolac (ACULAR) 0.5 % ophthalmic solution Place 1 drop into the right eye 4 (four) times daily.   latanoprost (XALATAN) 0.005 % ophthalmic solution Place 1 drop into the right eye at bedtime.   Menthol-Methyl Salicylate (MUSCLE RUB) 10-15 % CREA Apply 1 application topically as needed for muscle pain.   nitroGLYCERIN (NITROSTAT) 0.4 MG SL tablet DISSOLVE ONE TABLET UNDER THE TONGUE EVERY 5 MINUTES AS NEEDED FOR CHEST PAIN.  DO NOT EXCEED A TOTAL OF 3 DOSES IN 15 MINUTES   rosuvastatin (CRESTOR) 20 MG tablet TAKE 1 TABLET BY MOUTH ONCE DAILY (DISCONTINUE  PRAVASTATIN)     Radiology:   CT Angio Chest 02/17/2016: 1. No evidence of aortic dissection. No evidence of aneurysmaldilatation. 2. No evidence of significant pulmonary embolus. 3.  Scattered calcific atherosclerotic disease along the abdominal aorta and its branches, with likely mild to moderate luminal narrowing along the external and internal iliac arteries bilaterally. 4. **An incidental finding of potential clinical significance has been found. 1.9 cm hypodense nodule at the right thyroid lobe. 5. Scattered coronary artery calcifications seen. 6. Minimal emphysema at the lung apices.  Lungs otherwise clear. 7. Small umbilical hernia, containing only fat. 8. Mild degenerative change along the lumbar spine.  Cardiac Studies:   Exercise Myoview stress test 03/23/2018: 1. The patient performed treadmill exercise using Bruce protocol, completing 5:50 minutes. The patient completed an estimated workload of 7 METS, reaching 83% of the maximum predicted heart rate. Exercise capacity was low. Hemodynamic response was normal. No stress symptoms reported. No ischemic changes seen on stress electrocardiogram.  Marginally sub-maximal stress test. 2.  The overall quality of the study is good. There is no evidence of abnormal lung activity. Stress and rest SPECT images demonstrate homogeneous tracer distribution throughout the myocardium. Gated SPECT imaging reveals normal myocardial thickening and wall motion. The left ventricular ejection fraction was calculated 46%, although visually appears normal.    3. Low risk study. Marginally sub-maximal study. Clinical correlation  recommended.   Peripheral arteriogram 06/15/19: Abdominal aortogram:  No evidence of abdominal aneurysm. 2 renal arteries one on either side and they're widely patent.  Aortoiliac bifurcation was widely patent.  There was diffuse calcification of the abdominal aorta.  No aneurysm identified. Right iliac artery and right common femoral artery widely patent.  Right SFA has severe calcification and there is a high-grade proximal 90% stenosis followed by skipped multiple 80 to 90% stenosis and distal right SFA has a 30 to  35 mm of 80% stenosis.  Two-vessel runoff in the form of peroneal and PT on the right.  Left common and external iliac artery has a 60 to 70% stenosis, there was a 25 mmHg pressure gradient. SP self-expanding absolute Pro 9.0 x 60 mm stent deployed and postdilated with a 8.0 x 40 mm Mustang balloon. Angioplasty performed 03/15/2014 Left external iliac artery 9.0 x 60, 9.0 x 20 mm self-expanding stent is patent.  Left SFA has focal restenosis from 2017 both in the left distal SFA and left proximal to mid segment of the popliteal artery SP chocolate balloon angioplasty with a 5 mm x 40 mm chocolate balloon and the left distal proximal to mid popliteal artery angioplasty second inflation was performed with a 5 mm x 40 mm Mustang balloon with less than 5% residual stenosis and brisk flow.  Orbital atherectomy site in the left distal SFA is widely patent from two thousand sixteen.   Peripheral arteriogram and angioplasty of the right mid SFA and right popliteal artery 07/20/2019: Successful orbital arthrectomy of the right mid SFA and right proximal popliteal artery with a 2.0 solid crown CSI diamondback catheter followed by plain balloon angioplasty with a 5.0 x 40 mm Jade in the right proximal popliteal artery and drug-coated balloon, 5.0 x 150 mm Ranger into the mid right SFA, stenosis reduced from 90% in the mid right SFA to less than 10% and similarly from 90% calcific stenosis in the right popliteal artery to less than 10% with smooth flow and without edge dissection.  ABI 11/17/2019:  This exam reveals moderately decreased perfusion of the right and left lower extremity, noted at the post tibial artery level (ABI 0.65 right and 0.59 left) with markedly abnormal monophasic waveform at the ankle.  No significant change from 08/02/2019  Lower Extremity Arterial Duplex 08/17/2020: Moderate velocity increase at the right proximal superficial femoral artery suggests >50% stenosis. Moderate velocity increase at  the left profunda femoral artery >50% stenosis. There is diffuse heterogeneous plaque noted in bilateral lower extremity.  This exam reveals moderately decreased perfusion of the right lower extremity, noted at the post tibial artery level (ABI 0.70) and moderately decreased perfusion of the left lower extremity, noted at the post tibial artery level (ABI 0.57).  Compared to 05/26/2019, bilateral ABI 0.67. Right SFA restenosis is new.    EKG  10/23/2020: Sinus bradycardia at a rate of 55 bpm.  Normal axis.  No evidence of ischemia or underlying injury pattern.  EKG 08/24/2020: Marked sinus bradycardia at rate of 46 bpm, normal axis, no evidence of ischemia. No significant change from EKG 06/23/2019: Marked sinus bradycardia at rate of 44 bpm. No significant change from EKG 05/20/2019, 08/12/2018.   Assessment     ICD-10-CM   1. Claudication in peripheral vascular disease (HCC)  I73.9     2. Pure hypercholesterolemia  E78.00     3. Essential hypertension, benign  I10     4. Bradycardia  R00.1 EKG 12-Lead  No orders of the defined types were placed in this encounter.  There are no discontinued medications.   Orders Placed This Encounter  Procedures   EKG 12-Lead    Recommendations:   Timothy Webster  is a 75 y.o.  AAM male  with PAD, hyperlipidemia, tobacco use disorder which he has quit in May 2017, PAD with history of left external iliac artery stenting in in 2016 and 2021 and and angioplasty balloon angioplasty of the left proximal SFA.  Left distal SFA orbital atherectomy site from 2017 was widely patent.  He also underwent repeat right femoral arteriogram and angioplasty and atherectomy for right mid SFA and right popliteal artery in July 2021.    Patient presents for 1 year follow-up.  At last office visit added Zetia 10 mg daily and recommended repeat lower extremity arterial duplex.  Repeat lipid profile testing shows LDL is not at goal.  He is scheduled for repeat lower  extremity arterial duplex in February 2023.  Overall patient is doing well.  He has history of stable sinus bradycardia and despite low blood pressure readings at home blood pressure is minimally elevated in the office today.  He remains asymptomatic both from bradycardia and hypotensive standpoint.  Therefore no changes are made at today's office visit.  Patient also demonstrated chronotropic competence walking in the office hallway today.  Also patient reported signs and symptoms that would warrant urgent or emergent evaluation, he verbalized understanding agreement.  Lipids are now well controlled.  Patient will have repeat lower extremity arterial duplex prior to next office visit.  Patient continues to have claudication symptoms, which are stable, and physical exam is stable, will continue to monitor.  Follow-up in 6 months, sooner if needed, for PAD, hyperlipidemia, hypertension.   Rayford Halsted, PA-C 10/23/2020, 9:50 AM Office: [phone]

## 2020-11-07 DIAGNOSIS — I10 Essential (primary) hypertension: Secondary | ICD-10-CM | POA: Diagnosis not present

## 2020-11-07 DIAGNOSIS — I739 Peripheral vascular disease, unspecified: Secondary | ICD-10-CM | POA: Diagnosis not present

## 2020-11-07 DIAGNOSIS — R202 Paresthesia of skin: Secondary | ICD-10-CM | POA: Diagnosis not present

## 2020-11-07 DIAGNOSIS — I7389 Other specified peripheral vascular diseases: Secondary | ICD-10-CM | POA: Diagnosis not present

## 2020-11-07 DIAGNOSIS — E7800 Pure hypercholesterolemia, unspecified: Secondary | ICD-10-CM | POA: Diagnosis not present

## 2020-11-07 DIAGNOSIS — R001 Bradycardia, unspecified: Secondary | ICD-10-CM | POA: Diagnosis not present

## 2020-11-21 DIAGNOSIS — H35351 Cystoid macular degeneration, right eye: Secondary | ICD-10-CM | POA: Diagnosis not present

## 2020-11-21 DIAGNOSIS — H4051X3 Glaucoma secondary to other eye disorders, right eye, severe stage: Secondary | ICD-10-CM | POA: Diagnosis not present

## 2020-11-21 DIAGNOSIS — H44522 Atrophy of globe, left eye: Secondary | ICD-10-CM | POA: Diagnosis not present

## 2020-12-19 ENCOUNTER — Other Ambulatory Visit: Payer: Self-pay | Admitting: Cardiology

## 2020-12-19 DIAGNOSIS — E7800 Pure hypercholesterolemia, unspecified: Secondary | ICD-10-CM

## 2021-01-17 ENCOUNTER — Other Ambulatory Visit: Payer: Self-pay | Admitting: Cardiology

## 2021-01-17 DIAGNOSIS — I10 Essential (primary) hypertension: Secondary | ICD-10-CM

## 2021-01-17 DIAGNOSIS — E7800 Pure hypercholesterolemia, unspecified: Secondary | ICD-10-CM

## 2021-02-13 DIAGNOSIS — R001 Bradycardia, unspecified: Secondary | ICD-10-CM | POA: Diagnosis not present

## 2021-02-13 DIAGNOSIS — E781 Pure hyperglyceridemia: Secondary | ICD-10-CM | POA: Diagnosis not present

## 2021-02-13 DIAGNOSIS — I739 Peripheral vascular disease, unspecified: Secondary | ICD-10-CM | POA: Diagnosis not present

## 2021-02-13 DIAGNOSIS — I10 Essential (primary) hypertension: Secondary | ICD-10-CM | POA: Diagnosis not present

## 2021-02-15 ENCOUNTER — Other Ambulatory Visit: Payer: Self-pay

## 2021-02-15 ENCOUNTER — Ambulatory Visit: Payer: Medicare Other

## 2021-02-15 DIAGNOSIS — I739 Peripheral vascular disease, unspecified: Secondary | ICD-10-CM | POA: Diagnosis not present

## 2021-02-23 ENCOUNTER — Ambulatory Visit: Payer: Medicare Other | Admitting: Cardiology

## 2021-03-13 ENCOUNTER — Other Ambulatory Visit: Payer: Self-pay | Admitting: Cardiology

## 2021-03-13 DIAGNOSIS — I10 Essential (primary) hypertension: Secondary | ICD-10-CM

## 2021-03-13 DIAGNOSIS — I739 Peripheral vascular disease, unspecified: Secondary | ICD-10-CM

## 2021-03-30 ENCOUNTER — Ambulatory Visit (HOSPITAL_COMMUNITY)
Admission: RE | Admit: 2021-03-30 | Discharge: 2021-03-30 | Disposition: A | Discharge status: Home / Self Care | Payer: Medicare Other | Source: Ambulatory Visit | Attending: Family Medicine | Admitting: Family Medicine

## 2021-03-30 ENCOUNTER — Other Ambulatory Visit: Payer: Self-pay

## 2021-03-30 ENCOUNTER — Other Ambulatory Visit (HOSPITAL_COMMUNITY): Payer: Self-pay | Admitting: Family Medicine

## 2021-03-30 DIAGNOSIS — M25511 Pain in right shoulder: Secondary | ICD-10-CM

## 2021-03-30 DIAGNOSIS — R079 Chest pain, unspecified: Secondary | ICD-10-CM | POA: Diagnosis not present

## 2021-03-30 DIAGNOSIS — M19011 Primary osteoarthritis, right shoulder: Secondary | ICD-10-CM | POA: Diagnosis not present

## 2021-04-10 DIAGNOSIS — H44522 Atrophy of globe, left eye: Secondary | ICD-10-CM | POA: Diagnosis not present

## 2021-04-10 DIAGNOSIS — H4051X3 Glaucoma secondary to other eye disorders, right eye, severe stage: Secondary | ICD-10-CM | POA: Diagnosis not present

## 2021-04-10 DIAGNOSIS — H35351 Cystoid macular degeneration, right eye: Secondary | ICD-10-CM | POA: Diagnosis not present

## 2021-04-16 DIAGNOSIS — I10 Essential (primary) hypertension: Secondary | ICD-10-CM | POA: Diagnosis not present

## 2021-04-16 DIAGNOSIS — R001 Bradycardia, unspecified: Secondary | ICD-10-CM | POA: Diagnosis not present

## 2021-04-16 DIAGNOSIS — M79621 Pain in right upper arm: Secondary | ICD-10-CM | POA: Diagnosis not present

## 2021-04-23 ENCOUNTER — Ambulatory Visit: Payer: Medicare Other | Admitting: Cardiology

## 2021-04-23 ENCOUNTER — Encounter: Payer: Self-pay | Admitting: Cardiology

## 2021-04-23 VITALS — BP 121/66 | HR 73 | Temp 97.1°F | Resp 16 | Ht 62.0 in | Wt 132.2 lb

## 2021-04-23 DIAGNOSIS — E7800 Pure hypercholesterolemia, unspecified: Secondary | ICD-10-CM | POA: Diagnosis not present

## 2021-04-23 DIAGNOSIS — I10 Essential (primary) hypertension: Secondary | ICD-10-CM

## 2021-04-23 DIAGNOSIS — R001 Bradycardia, unspecified: Secondary | ICD-10-CM | POA: Diagnosis not present

## 2021-04-23 DIAGNOSIS — I739 Peripheral vascular disease, unspecified: Secondary | ICD-10-CM | POA: Diagnosis not present

## 2021-04-23 NOTE — H&P (View-Only) (Signed)
? ?Primary Physician/Referring:  [name], MD ? ?Patient ID: OLEN EAVES, male    DOB: 01-07-1946, 76 y.o.   MRN: [id number] ? ?Chief Complaint  ?Patient presents with  ? PAD  ? Coronary Artery Disease  ? Hypertension  ? Hyperlipidemia  ? Follow-up  ?  6 months  ? ?HPI:   ? ?CALOGERO GEISEN  is a 76 y.o. AAM male  with PAD, hyperlipidemia, tobacco use disorder which he has quit in May 2017, PAD here for 95-month follow-up visit.  Patient has had left SFA and popliteal artery angioplasty in 2017 and again in 2021 and left common iliac and external iliac artery self-expanding stents in 2017 and 2021.  Right SFA atherectomy followed by balloon angioplasty in 2021. ? ?He is now having right hip claudication states that he has to frequently stop and also has severe night cramps in his right calf.  He also has cramping in his left calf but states that right leg is hurting more.  No open wounds, no rest pain. ? ?Denies chest pain, dyspnea, syncope, near syncope.  Denies orthopnea, PND, leg swelling.   ? ?Past Medical History:  ?Diagnosis Date  ? Glaucoma 02/25/2018  ? Hyperlipidemia   ? Hypertension   ? PAD (peripheral artery disease) (HCC)   ? Sinus bradycardia on ECG 02/25/2018  ? EKG 10/12/2015: Marked sinus bradycardia at rate of 44 bpm, normal axis. No evidence of ischemia otherwise normal EKG. No significant change from EKG 04/19/2015: Normal sinus rhythm at rate of 52 bpm, EKG 12/24/2013: Marked sinus bradycardia at the rate of 39 bpm.  ? Tobacco use disorder, severe, dependence 02/25/2018  ? ? ?Family History  ?Problem Relation Age of Onset  ? Hypertension Mother   ? Hyperlipidemia Mother   ? Heart disease Mother   ? ?Social History  ? ?Tobacco Use  ? Smoking status: Former  ?  Packs/day: 1.00  ?  Years: 15.00  ?  Pack years: 15.00  ?  Types: Cigarettes  ?  Quit date: 07/17/2015  ?  Years since quitting: 5.7  ? Smokeless tobacco: Never  ?Substance Use Topics  ? Alcohol use: No  ? ?Marital Status: Married ?ROS   ?Review of Systems  ?Constitutional: Negative for malaise/fatigue and weight gain.  ?Cardiovascular:  Positive for claudication (stable) and dyspnea on exertion (stable). Negative for chest pain, leg swelling, near-syncope, orthopnea, palpitations, paroxysmal nocturnal dyspnea and syncope.  ?Respiratory:  Negative for shortness of breath.   ?Musculoskeletal:  Positive for arthritis and back pain.  ?Gastrointestinal:  Negative for heartburn and melena.  ?Neurological:  Positive for paresthesias. Negative for dizziness.  ?Objective  ? ? ?  04/23/2021  ? 11:17 AM 10/23/2020  ?  9:12 AM 08/24/2020  ? 10:45 AM  ?Vitals with BMI  ?Height 5\' 2"  5\' 2"  5\' 2"   ?Weight 132 lbs 3 oz 130 lbs 127 lbs  ?BMI 24.17 23.77 23.22  ?Systolic 121 134  ?Diastolic 66 65 66  ?Pulse 73 61 68  ?  ?Physical Exam ?Vitals reviewed.  ?Constitutional:   ?   General: He is not in acute distress. ?   Appearance: He is well-developed.  ?   Comments: petite  ?Neck:  ?   Thyroid: No thyromegaly.  ?   Vascular: No JVD.  ?Cardiovascular:  ?   Rate and Rhythm: Normal rate and regular rhythm.  ?   Pulses:     ?     Carotid pulses are 2+ on the  right side and 2+ on the left side. ?     Femoral pulses are 2+ on the right side with bruit and 1+ on the left side with bruit. ?     Popliteal pulses are 2+ on the right side and 2+ on the left side.  ?     Dorsalis pedis pulses are 0 on the right side and 1+ on the left side.  ?     Posterior tibial pulses are 1+ on the right side and 2+ on the left side.  ?   Heart sounds: Normal heart sounds. No murmur heard. ?  No gallop.  ?   Comments: Loud bifemoral bruit present. Loss of hair. Warm extremity.  ?No JVD.  ?Pulmonary:  ?   Effort: Pulmonary effort is normal.  ?   Breath sounds: No wheezing or rales.  ?Abdominal:  ?   General: Bowel sounds are normal.  ?   Palpations: Abdomen is soft.  ?Musculoskeletal:  ?   Right lower leg: No edema.  ?   Left lower leg: No edema.  ?Neurological:  ?   Mental Status: He is  alert.  ? ?Laboratory examination:  ? ?No results for input(s): NA, K, CL, CO2, GLUCOSE, BUN, CREATININE, CALCIUM, GFRNONAA, GFRAA in the last 8760 hours. ? ?CrCl cannot be calculated (Patient's most recent lab result is older than the maximum 21 days allowed.).  ? ?  Latest Ref Rng & Units 07/21/2019  ?  3:58 AM 07/15/2019  ?  1:46 PM 06/11/2019  ? 11:10 AM  ?CMP  ?Glucose 70 - 99 mg/dL 94   83   91    ?BUN 8 - 23 mg/dL 11   12   18    ?Creatinine 0.61 - 1.24 mg/dL 0.90   0.83   1.07    ?Sodium 135 - 145 mmol/L 139   142   137    ?Potassium 3.5 - 5.1 mmol/L 3.8   4.6   4.8    ?Chloride 98 - 111 mmol/L 113   105   104    ?CO2 22 - 32 mmol/L 20   22   21    ?Calcium 8.9 - 10.3 mg/dL 7.9   9.9   9.9    ? ? ?  Latest Ref Rng & Units 11/08/2019  ? 10:34 AM 07/21/2019  ?  9:55 AM 07/21/2019  ?  3:58 AM  ?CBC  ?WBC 3.4 - 10.8 x10E3/uL 6.4    7.1    ?Hemoglobin 13.0 - 17.7 g/dL 14.0   7.9   7.9    ?Hematocrit 37.5 - 51.0 % 44.0   25.1   25.5    ?Platelets 150 - 450 x10E3/uL 230    142    ? ? ?External Labs: ? ?Cholesterol, total 134.000 m 11/07/2020 ?HDL 68.000 mg 11/07/2020 ?LDL 52.000 mg 11/07/2020 ?Triglycerides 61.000 mg 11/07/2020 ? ?Hemoglobin 14.000 G/ 11/08/2019 ?Platelets 230.000 x1 11/08/2019 ? ?Creatinine, Serum 0.930 mg/ 11/07/2020 ?Potassium 3.900 mm 11/07/2020 ?ALT (SGPT) 19.000 U/L 11/07/2020 ? ?TSH 1.110 10/30/2018 ? ?Allergies  ?No Known Allergies  ? ? ?Medications  ? ? ?Current Outpatient Medications:  ?  acetaminophen (TYLENOL) 500 MG tablet, Take 1,000 mg by mouth every 6 (six) hours as needed (pain)., Disp: , Rfl:  ?  amLODipine-benazepril (LOTREL) 10-40 MG capsule, Take 1 capsule by mouth once daily, Disp: 90 capsule, Rfl: 0 ?  aspirin EC 81 MG tablet, Take 81 mg by mouth daily., Disp: , Rfl:  ?    brimonidine (ALPHAGAN) 0.2 % ophthalmic solution, Place 1 drop into the right eye 2 (two) times daily., Disp: , Rfl:  ?  cilostazol (PLETAL) 100 MG tablet, Take 1 tablet by mouth twice daily, Disp: 180 tablet, Rfl: 0 ?   dorzolamide-timolol (COSOPT) 22.3-6.8 MG/ML ophthalmic solution, Place 1 drop into the right eye 2 (two) times daily., Disp: , Rfl:  ?  ezetimibe (ZETIA) 10 MG tablet, Take 1 tablet (10 mg total) by mouth daily., Disp: 90 tablet, Rfl: 3 ?  gabapentin (NEURONTIN) 300 MG capsule, Take 1 capsule (300 mg total) by mouth in the morning, at noon, and at bedtime. Try three times a day to see if symptoms of leg pain improves, Disp: 180 capsule, Rfl: 3 ?  isosorbide dinitrate (ISORDIL) 30 MG tablet, Take 30 mg by mouth daily., Disp: , Rfl:  ?  ketorolac (ACULAR) 0.5 % ophthalmic solution, Place 1 drop into the right eye 4 (four) times daily., Disp: , Rfl:  ?  latanoprost (XALATAN) 0.005 % ophthalmic solution, Place 1 drop into the right eye at bedtime., Disp: , Rfl:  ?  Menthol-Methyl Salicylate (MUSCLE RUB) 10-15 % CREA, Apply 1 application topically as needed for muscle pain., Disp: , Rfl:  ?  nitroGLYCERIN (NITROSTAT) 0.4 MG SL tablet, DISSOLVE ONE TABLET UNDER THE TONGUE EVERY 5 MINUTES AS NEEDED FOR CHEST PAIN.  DO NOT EXCEED A TOTAL OF 3 DOSES IN 15 MINUTES, Disp: 25 tablet, Rfl: 0 ?  rosuvastatin (CRESTOR) 20 MG tablet, Take 1 tablet by mouth once daily, Disp: 90 tablet, Rfl: 0  ? ?Radiology:  ? ?CT Angio Chest 02/17/2016: ?1. No evidence of aortic dissection. No evidence of aneurysmaldilatation. ?2. No evidence of significant pulmonary embolus. ?3. Scattered calcific atherosclerotic disease along the abdominal aorta and its branches, with likely mild to moderate luminal narrowing along the external and internal iliac arteries bilaterally. ?4. **An incidental finding of potential clinical significance has been found. 1.9 cm hypodense nodule at the right thyroid lobe. ?5. Scattered coronary artery calcifications seen. ?6. Minimal emphysema at the lung apices.  Lungs otherwise clear. ?7. Small umbilical hernia, containing only fat. ?8. Mild degenerative change along the lumbar spine. ? ?Cardiac Studies:  ? ?Exercise  Myoview stress test 03/23/2018: ?1. The patient performed treadmill exercise using Bruce protocol, completing 5:50 minutes. The patient completed an estimated workload of 7 METS, reaching 83% of the maximum predicted h

## 2021-04-23 NOTE — Progress Notes (Signed)
? ?Primary Physician/Referring:  [name], MD ? ?[name], male    DOB: 01-07-1946, 76 y.o.   MRN: [id number] ? ?Chief Complaint  ?Patient presents with  ? PAD  ? Coronary Artery Disease  ? Hypertension  ? Hyperlipidemia  ? Follow-up  ?  6 months  ? ?HPI:   ? ?Timothy Webster  is a 76 y.o. AAM male  with PAD, hyperlipidemia, tobacco use disorder which he has quit in May 2017, PAD here for 95-month follow-up visit.  Patient has had left SFA and popliteal artery angioplasty in 2017 and again in 2021 and left common iliac and external iliac artery self-expanding stents in 2017 and 2021.  Right SFA atherectomy followed by balloon angioplasty in 2021. ? ?He is now having right hip claudication states that he has to frequently stop and also has severe night cramps in his right calf.  He also has cramping in his left calf but states that right leg is hurting more.  No open wounds, no rest pain. ? ?Denies chest pain, dyspnea, syncope, near syncope.  Denies orthopnea, PND, leg swelling.   ? ?Past Medical History:  ?Diagnosis Date  ? Glaucoma 02/25/2018  ? Hyperlipidemia   ? Hypertension   ? PAD (peripheral artery disease) (HCC)   ? Sinus bradycardia on ECG 02/25/2018  ? EKG 10/12/2015: Marked sinus bradycardia at rate of 44 bpm, normal axis. No evidence of ischemia otherwise normal EKG. No significant change from EKG 04/19/2015: Normal sinus rhythm at rate of 52 bpm, EKG 12/24/2013: Marked sinus bradycardia at the rate of 39 bpm.  ? Tobacco use disorder, severe, dependence 02/25/2018  ? ? ?Family History  ?Problem Relation Age of Onset  ? Hypertension Mother   ? Hyperlipidemia Mother   ? Heart disease Mother   ? ?Social History  ? ?Tobacco Use  ? Smoking status: Former  ?  Packs/day: 1.00  ?  Years: 15.00  ?  Pack years: 15.00  ?  Types: Cigarettes  ?  Quit date: 07/17/2015  ?  Years since quitting: 5.7  ? Smokeless tobacco: Never  ?Substance Use Topics  ? Alcohol use: No  ? ?Marital Status: Married ?ROS   ?Review of Systems  ?Constitutional: Negative for malaise/fatigue and weight gain.  ?Cardiovascular:  Positive for claudication (stable) and dyspnea on exertion (stable). Negative for chest pain, leg swelling, near-syncope, orthopnea, palpitations, paroxysmal nocturnal dyspnea and syncope.  ?Respiratory:  Negative for shortness of breath.   ?Musculoskeletal:  Positive for arthritis and back pain.  ?Gastrointestinal:  Negative for heartburn and melena.  ?Neurological:  Positive for paresthesias. Negative for dizziness.  ?Objective  ? ? ?  04/23/2021  ? 11:17 AM 10/23/2020  ?  9:12 AM 08/24/2020  ? 10:45 AM  ?Vitals with BMI  ?Height 5\' 2"  5\' 2"  5\' 2"   ?Weight 132 lbs 3 oz 130 lbs 127 lbs  ?BMI 24.17 23.77 23.22  ?Systolic 121 134  ?Diastolic 66 65 66  ?Pulse 73 61 68  ?  ?Physical Exam ?Vitals reviewed.  ?Constitutional:   ?   General: He is not in acute distress. ?   Appearance: He is well-developed.  ?   Comments: petite  ?Neck:  ?   Thyroid: No thyromegaly.  ?   Vascular: No JVD.  ?Cardiovascular:  ?   Rate and Rhythm: Normal rate and regular rhythm.  ?   Pulses:     ?     Carotid pulses are 2+ on the  right side and 2+ on the left side. ?     Femoral pulses are 2+ on the right side with bruit and 1+ on the left side with bruit. ?     Popliteal pulses are 2+ on the right side and 2+ on the left side.  ?     Dorsalis pedis pulses are 0 on the right side and 1+ on the left side.  ?     Posterior tibial pulses are 1+ on the right side and 2+ on the left side.  ?   Heart sounds: Normal heart sounds. No murmur heard. ?  No gallop.  ?   Comments: Loud bifemoral bruit present. Loss of hair. Warm extremity.  ?No JVD.  ?Pulmonary:  ?   Effort: Pulmonary effort is normal.  ?   Breath sounds: No wheezing or rales.  ?Abdominal:  ?   General: Bowel sounds are normal.  ?   Palpations: Abdomen is soft.  ?Musculoskeletal:  ?   Right lower leg: No edema.  ?   Left lower leg: No edema.  ?Neurological:  ?   Mental Status: He is  alert.  ? ?Laboratory examination:  ? ?No results for input(s): NA, K, CL, CO2, GLUCOSE, BUN, CREATININE, CALCIUM, GFRNONAA, GFRAA in the last 8760 hours. ? ?CrCl cannot be calculated (Patient's most recent lab result is older than the maximum 21 days allowed.).  ? ?  Latest Ref Rng & Units 07/21/2019  ?  3:58 AM 07/15/2019  ?  1:46 PM 06/11/2019  ? 11:10 AM  ?CMP  ?Glucose 70 - 99 mg/dL 94   83   91    ?BUN 8 - 23 mg/dL 11   12   18     ?Creatinine 0.61 - 1.24 mg/dL 1.610.90   0.960.83   0.451.07    ?Sodium 135 - 145 mmol/L 139   142   137    ?Potassium 3.5 - 5.1 mmol/L 3.8   4.6   4.8    ?Chloride 98 - 111 mmol/L 113   105   104    ?CO2 22 - 32 mmol/L 20   22   21     ?Calcium 8.9 - 10.3 mg/dL 7.9   9.9   9.9    ? ? ?  Latest Ref Rng & Units 11/08/2019  ? 10:34 AM 07/21/2019  ?  9:55 AM 07/21/2019  ?  3:58 AM  ?CBC  ?WBC 3.4 - 10.8 x10E3/uL 6.4    7.1    ?Hemoglobin 13.0 - 17.7 g/dL 40.914.0   7.9   7.9    ?Hematocrit 37.5 - 51.0 % 44.0   25.1   25.5    ?Platelets 150 - 450 x10E3/uL 230    142    ? ? ?External Labs: ? ?Cholesterol, total 134.000 m 11/07/2020 ?HDL 68.000 mg 11/07/2020 ?LDL 52.000 mg 11/07/2020 ?Triglycerides 61.000 mg 11/07/2020 ? ?Hemoglobin 14.000 G/ 11/08/2019 ?Platelets 230.000 x1 11/08/2019 ? ?Creatinine, Serum 0.930 mg/ 11/07/2020 ?Potassium 3.900 mm 11/07/2020 ?ALT (SGPT) 19.000 U/L 11/07/2020 ? ?TSH 1.110 10/30/2018 ? ?Allergies  ?No Known Allergies  ? ? ?Medications  ? ? ?Current Outpatient Medications:  ?  acetaminophen (TYLENOL) 500 MG tablet, Take 1,000 mg by mouth every 6 (six) hours as needed (pain)., Disp: , Rfl:  ?  amLODipine-benazepril (LOTREL) 10-40 MG capsule, Take 1 capsule by mouth once daily, Disp: 90 capsule, Rfl: 0 ?  aspirin EC 81 MG tablet, Take 81 mg by mouth daily., Disp: , Rfl:  ?  brimonidine (ALPHAGAN) 0.2 % ophthalmic solution, Place 1 drop into the right eye 2 (two) times daily., Disp: , Rfl:  ?  cilostazol (PLETAL) 100 MG tablet, Take 1 tablet by mouth twice daily, Disp: 180 tablet, Rfl: 0 ?   dorzolamide-timolol (COSOPT) 22.3-6.8 MG/ML ophthalmic solution, Place 1 drop into the right eye 2 (two) times daily., Disp: , Rfl:  ?  ezetimibe (ZETIA) 10 MG tablet, Take 1 tablet (10 mg total) by mouth daily., Disp: 90 tablet, Rfl: 3 ?  gabapentin (NEURONTIN) 300 MG capsule, Take 1 capsule (300 mg total) by mouth in the morning, at noon, and at bedtime. Try three times a day to see if symptoms of leg pain improves, Disp: 180 capsule, Rfl: 3 ?  isosorbide dinitrate (ISORDIL) 30 MG tablet, Take 30 mg by mouth daily., Disp: , Rfl:  ?  ketorolac (ACULAR) 0.5 % ophthalmic solution, Place 1 drop into the right eye 4 (four) times daily., Disp: , Rfl:  ?  latanoprost (XALATAN) 0.005 % ophthalmic solution, Place 1 drop into the right eye at bedtime., Disp: , Rfl:  ?  Menthol-Methyl Salicylate (MUSCLE RUB) 10-15 % CREA, Apply 1 application topically as needed for muscle pain., Disp: , Rfl:  ?  nitroGLYCERIN (NITROSTAT) 0.4 MG SL tablet, DISSOLVE ONE TABLET UNDER THE TONGUE EVERY 5 MINUTES AS NEEDED FOR CHEST PAIN.  DO NOT EXCEED A TOTAL OF 3 DOSES IN 15 MINUTES, Disp: 25 tablet, Rfl: 0 ?  rosuvastatin (CRESTOR) 20 MG tablet, Take 1 tablet by mouth once daily, Disp: 90 tablet, Rfl: 0  ? ?Radiology:  ? ?CT Angio Chest 02/17/2016: ?1. No evidence of aortic dissection. No evidence of aneurysmaldilatation. ?2. No evidence of significant pulmonary embolus. ?3. Scattered calcific atherosclerotic disease along the abdominal aorta and its branches, with likely mild to moderate luminal narrowing along the external and internal iliac arteries bilaterally. ?4. **An incidental finding of potential clinical significance has been found. 1.9 cm hypodense nodule at the right thyroid lobe. ?5. Scattered coronary artery calcifications seen. ?6. Minimal emphysema at the lung apices.  Lungs otherwise clear. ?7. Small umbilical hernia, containing only fat. ?8. Mild degenerative change along the lumbar spine. ? ?Cardiac Studies:  ? ?Exercise  Myoview stress test 03/23/2018: ?1. The patient performed treadmill exercise using Bruce protocol, completing 5:50 minutes. The patient completed an estimated workload of 7 METS, reaching 83% of the maximum predicted h

## 2021-04-26 DIAGNOSIS — I739 Peripheral vascular disease, unspecified: Secondary | ICD-10-CM | POA: Diagnosis not present

## 2021-04-26 DIAGNOSIS — I2510 Atherosclerotic heart disease of native coronary artery without angina pectoris: Secondary | ICD-10-CM | POA: Diagnosis not present

## 2021-04-27 LAB — CBC
Hematocrit: 38.1 % (ref 37.5–51.0)
Hemoglobin: 12.5 g/dL — ABNORMAL LOW (ref 13.0–17.7)
MCH: 30.9 pg (ref 26.6–33.0)
MCHC: 32.8 g/dL (ref 31.5–35.7)
MCV: 94 fL (ref 79–97)
Platelets: 199 10*3/uL (ref 150–450)
RBC: 4.04 x10E6/uL — ABNORMAL LOW (ref 4.14–5.80)
RDW: 13.3 % (ref 11.6–15.4)
WBC: 5 10*3/uL (ref 3.4–10.8)

## 2021-05-01 ENCOUNTER — Other Ambulatory Visit (HOSPITAL_COMMUNITY): Payer: Self-pay

## 2021-05-01 ENCOUNTER — Other Ambulatory Visit: Payer: Self-pay

## 2021-05-01 ENCOUNTER — Encounter (HOSPITAL_COMMUNITY)
Admission: RE | Disposition: A | Discharge status: Home / Self Care | Payer: Self-pay | Source: Home / Self Care | Attending: Cardiology

## 2021-05-01 ENCOUNTER — Ambulatory Visit (HOSPITAL_COMMUNITY)
Admission: RE | Admit: 2021-05-01 | Discharge: 2021-05-01 | Disposition: A | Discharge status: Home / Self Care | Payer: Medicare Other | Attending: Cardiology | Admitting: Cardiology

## 2021-05-01 DIAGNOSIS — Z7982 Long term (current) use of aspirin: Secondary | ICD-10-CM | POA: Diagnosis not present

## 2021-05-01 DIAGNOSIS — R001 Bradycardia, unspecified: Secondary | ICD-10-CM | POA: Diagnosis not present

## 2021-05-01 DIAGNOSIS — Z79899 Other long term (current) drug therapy: Secondary | ICD-10-CM | POA: Insufficient documentation

## 2021-05-01 DIAGNOSIS — E7800 Pure hypercholesterolemia, unspecified: Secondary | ICD-10-CM | POA: Insufficient documentation

## 2021-05-01 DIAGNOSIS — I739 Peripheral vascular disease, unspecified: Secondary | ICD-10-CM | POA: Diagnosis present

## 2021-05-01 DIAGNOSIS — I70213 Atherosclerosis of native arteries of extremities with intermittent claudication, bilateral legs: Secondary | ICD-10-CM | POA: Insufficient documentation

## 2021-05-01 DIAGNOSIS — I700 Atherosclerosis of aorta: Secondary | ICD-10-CM | POA: Diagnosis not present

## 2021-05-01 DIAGNOSIS — I10 Essential (primary) hypertension: Secondary | ICD-10-CM | POA: Diagnosis not present

## 2021-05-01 DIAGNOSIS — Z87891 Personal history of nicotine dependence: Secondary | ICD-10-CM | POA: Diagnosis not present

## 2021-05-01 DIAGNOSIS — I70211 Atherosclerosis of native arteries of extremities with intermittent claudication, right leg: Secondary | ICD-10-CM | POA: Diagnosis not present

## 2021-05-01 HISTORY — PX: LOWER EXTREMITY ANGIOGRAPHY: CATH118251

## 2021-05-01 HISTORY — PX: PERIPHERAL VASCULAR INTERVENTION: CATH118257

## 2021-05-01 LAB — BASIC METABOLIC PANEL
Anion gap: 7 (ref 5–15)
BUN: 13 mg/dL (ref 8–23)
CO2: 22 mmol/L (ref 22–32)
Calcium: 9.1 mg/dL (ref 8.9–10.3)
Chloride: 112 mmol/L — ABNORMAL HIGH (ref 98–111)
Creatinine, Ser: 1.04 mg/dL (ref 0.61–1.24)
GFR, Estimated: >60 mL/min (ref >60)
Glucose, Bld: 105 mg/dL — ABNORMAL HIGH (ref 70–99)
Potassium: 3.8 mmol/L (ref 3.5–5.1)
Sodium: 141 mmol/L (ref 135–145)

## 2021-05-01 LAB — POCT ACTIVATED CLOTTING TIME
Activated Clotting Time: 245 seconds
Activated Clotting Time: 281 seconds

## 2021-05-01 SURGERY — PERIPHERAL VASCULAR INTERVENTION
Anesthesia: LOCAL

## 2021-05-01 MED ORDER — CLOPIDOGREL BISULFATE 300 MG PO TABS: 300.0000 mg | ORAL_TABLET | Freq: Once | ORAL | Status: DC

## 2021-05-01 MED ORDER — SODIUM CHLORIDE 0.9% FLUSH: 3.0000 mL | Freq: Two times a day (BID) | INTRAVENOUS | Status: DC

## 2021-05-01 MED ORDER — SODIUM CHLORIDE 0.9% FLUSH: 3.0000 mL | INTRAVENOUS | Status: DC | PRN

## 2021-05-01 MED ORDER — LIDOCAINE-EPINEPHRINE 1 %-1:100000 IJ SOLN
INTRAMUSCULAR | Status: DC | PRN
  Administered 2021-05-01: 1 mL

## 2021-05-01 MED ORDER — MIDAZOLAM HCL 2 MG/2ML IJ SOLN
INTRAMUSCULAR | Status: DC | PRN
  Administered 2021-05-01: 2 mg via INTRAVENOUS

## 2021-05-01 MED ORDER — LIDOCAINE-EPINEPHRINE 1 %-1:100000 IJ SOLN
INTRAMUSCULAR | Status: AC
Start: 2021-05-01 — End: ?
  Filled 2021-05-01: qty 1

## 2021-05-01 MED ORDER — SODIUM CHLORIDE 0.9 % IV SOLN: 250.0000 mL | INTRAVENOUS | Status: DC | PRN

## 2021-05-01 MED ORDER — SODIUM CHLORIDE 0.9 % IV SOLN: INTRAVENOUS | Status: DC

## 2021-05-01 MED ORDER — HEPARIN SODIUM (PORCINE) 1000 UNIT/ML IJ SOLN
INTRAMUSCULAR | Status: AC
Start: 2021-05-01 — End: ?
  Filled 2021-05-01: qty 10

## 2021-05-01 MED ORDER — IODIXANOL 320 MG/ML IV SOLN
INTRAVENOUS | Status: DC | PRN
  Administered 2021-05-01: 165 mL via INTRA_ARTERIAL

## 2021-05-01 MED ORDER — ONDANSETRON HCL 4 MG/2ML IJ SOLN: 4.0000 mg | Freq: Four times a day (QID) | INTRAMUSCULAR | Status: DC | PRN

## 2021-05-01 MED ORDER — CLOPIDOGREL BISULFATE 300 MG PO TABS
ORAL_TABLET | ORAL | Status: DC | PRN
Start: 2021-05-01 — End: 2021-05-02
  Administered 2021-05-01: 300 mg via ORAL

## 2021-05-01 MED ORDER — FENTANYL CITRATE (PF) 100 MCG/2ML IJ SOLN
INTRAMUSCULAR | Status: AC
  Filled 2021-05-01: qty 2

## 2021-05-01 MED ORDER — HEPARIN (PORCINE) IN NACL 1000-0.9 UT/500ML-% IV SOLN
INTRAVENOUS | Status: AC
  Filled 2021-05-01: qty 1000

## 2021-05-01 MED ORDER — ACETAMINOPHEN 325 MG PO TABS: 650.0000 mg | ORAL_TABLET | ORAL | Status: DC | PRN

## 2021-05-01 MED ORDER — LIDOCAINE HCL (PF) 1 % IJ SOLN
INTRAMUSCULAR | Status: DC | PRN
  Administered 2021-05-01: 15 mL via INTRADERMAL

## 2021-05-01 MED ORDER — CLOPIDOGREL BISULFATE 75 MG PO TABS
75.0000 mg | ORAL_TABLET | Freq: Every day | ORAL | 0 refills | Status: DC
  Filled 2021-05-01: qty 30, 30d supply, fill #0

## 2021-05-01 MED ORDER — HEPARIN SODIUM (PORCINE) 1000 UNIT/ML IJ SOLN
INTRAMUSCULAR | Status: DC | PRN
  Administered 2021-05-01: 7000 [IU] via INTRAVENOUS
  Administered 2021-05-01: 3000 [IU] via INTRAVENOUS
  Administered 2021-05-01: 2000 [IU] via INTRAVENOUS

## 2021-05-01 MED ORDER — SODIUM CHLORIDE 0.9 % IV BOLUS
500.0000 mL | Freq: Once | INTRAVENOUS | Status: AC
  Administered 2021-05-01: 500 mL via INTRAVENOUS

## 2021-05-01 MED ORDER — FENTANYL CITRATE (PF) 100 MCG/2ML IJ SOLN
INTRAMUSCULAR | Status: DC | PRN
  Administered 2021-05-01: 25 ug via INTRAVENOUS

## 2021-05-01 MED ORDER — LIDOCAINE HCL (PF) 1 % IJ SOLN
INTRAMUSCULAR | Status: AC
  Filled 2021-05-01: qty 30

## 2021-05-01 MED ORDER — HEPARIN SODIUM (PORCINE) 1000 UNIT/ML IJ SOLN
INTRAMUSCULAR | Status: AC
  Filled 2021-05-01: qty 10

## 2021-05-01 MED ORDER — MIDAZOLAM HCL 2 MG/2ML IJ SOLN
INTRAMUSCULAR | Status: AC
  Filled 2021-05-01: qty 2

## 2021-05-01 MED ORDER — HEPARIN (PORCINE) IN NACL 1000-0.9 UT/500ML-% IV SOLN
INTRAVENOUS | Status: DC | PRN
  Administered 2021-05-01 (×2): 500 mL

## 2021-05-01 MED ORDER — CLOPIDOGREL BISULFATE 300 MG PO TABS
ORAL_TABLET | ORAL | Status: AC
  Filled 2021-05-01: qty 1

## 2021-05-01 MED ORDER — SODIUM CHLORIDE 0.9 % WEIGHT BASED INFUSION: 1.0000 mL/kg/h | INTRAVENOUS | Status: DC

## 2021-05-01 SURGICAL SUPPLY — 27 items
BALLN STERLING OTW 8X60X135 (BALLOONS) ×3
BALLOON STERLING OTW 8X60X135 (BALLOONS) IMPLANT
CATH ANGIO 5F PIGTAIL 65CM (CATHETERS) ×1 IMPLANT
CATH SHOCKWAVE M5 8.0X60 (CATHETERS) ×1 IMPLANT
CATH SOFT-VU 4F 65 STRAIGHT (CATHETERS) IMPLANT
CATH SOFT-VU STRAIGHT 4F 65CM (CATHETERS) ×2
CLOSURE PERCLOSE PROSTYLE (VASCULAR PRODUCTS) ×1 IMPLANT
FEM STOP ARCH (HEMOSTASIS) ×2
GLIDEWIRE ADV .035X260CM (WIRE) ×1 IMPLANT
GLIDEWIRE NITREX 0.018X80X5 (WIRE) ×1
GUIDEWIRE NITREX 0.018X80X5 (WIRE) IMPLANT
KIT ENCORE 26 ADVANTAGE (KITS) ×1 IMPLANT
KIT MICROPUNCTURE NIT STIFF (SHEATH) ×1 IMPLANT
KIT PV (KITS) ×3 IMPLANT
SHEATH PINNACLE 5F 10CM (SHEATH) ×1 IMPLANT
SHEATH PINNACLE MP 7F 45CM (SHEATH) ×1 IMPLANT
SHEATH PROBE COVER 6X72 (BAG) ×1 IMPLANT
STENT INNOVA 8X80X130 (Permanent Stent) ×1 IMPLANT
STOPCOCK MORSE 400PSI 3WAY (MISCELLANEOUS) ×1 IMPLANT
SYR MEDRAD MARK 7 150ML (SYRINGE) ×3 IMPLANT
SYSTEM COMPRESSION FEMOSTOP (HEMOSTASIS) IMPLANT
TAPE SHOOT N SEE (TAPE) ×1 IMPLANT
TRANSDUCER W/STOPCOCK (MISCELLANEOUS) ×3 IMPLANT
TRAY PV CATH (CUSTOM PROCEDURE TRAY) ×3 IMPLANT
TUBING CIL FLEX 10 FLL-RA (TUBING) ×1 IMPLANT
WIRE HITORQ VERSACORE ST 145CM (WIRE) ×1 IMPLANT
WIRE SPARTACORE .014X300CM (WIRE) ×1 IMPLANT

## 2021-05-01 NOTE — Progress Notes (Signed)
Per Dr. Jacinto Halim, deflated femstop and ambulated patient at 1930. Writer observed no bleeding from left groin site post ambulation. Dr. Jacinto Halim notified and instructed writer to lay patient flat for 30 more minutes then OK to discharge home. Orders followed. Will continue to monitor.  ?

## 2021-05-01 NOTE — Progress Notes (Signed)
Site area: left femoral ?Site Prior to Removal:  Level 1, slight oozing ?Applied Femstop to the left femoral arterial site, to 65mm/hg per Dr Jacinto Halim  ?Patient tolerated well  ?Post Pull Site:  Level 0, no oozing ?Post Pull Instructions Given:  yes ?Post Pull Pulses Present: Left Dorsalis pedis present with doppler ?   ? ?Comments: ?  ?

## 2021-05-01 NOTE — Interval H&P Note (Signed)
History and Physical Interval Note: ? ?05/01/2021 ?7:50 AM ? ?Timothy Webster  has presented today for surgery, with the diagnosis of claudicatioin.  The various methods of treatment have been discussed with the patient and family. After consideration of risks, benefits and other options for treatment, the patient has consented to  Procedure(s): ?Lower Extremity Angiography (N/A) and possible angioplasty as a surgical intervention.  The patient's history has been reviewed, patient examined, no change in status, stable for surgery.  I have reviewed the patient's chart and labs.  Questions were answered to the patient's satisfaction.   ? ?His right leg is colder than left (physical exam was improperly documented as normal). Otherwise no change. ? ? ?Yates Decamp, MD, [facility] ?05/01/2021, 7:51 AM ?Office: [phone] ?Fax: [phone] ?Pager: [phone]  ?

## 2021-05-01 NOTE — Progress Notes (Signed)
Client with bleeding noted again after using urinal; Dr Jacinto Halim notified; pressure had been held x and still oozing noted; Fem-stop applied by cath lab staff ?

## 2021-05-01 NOTE — Progress Notes (Signed)
Per Dr Einar Gip add 31mm mercury in femstop ?

## 2021-05-01 NOTE — Progress Notes (Signed)
Client used urinal and bleeding noted left groin after used urinal;pressure held x 63min and no further bleeding; Dr Einar Gip notified and per Dr Einar Gip bedrest until 1800 ?

## 2021-05-01 NOTE — Progress Notes (Signed)
Dr Jacinto Halim notified of femstop on and pressure decreased to 31mm mercury and per Dr Jacinto Halim leave femstop on for 2 hours with 9mm mercury and then release pressure per protocol ?

## 2021-05-01 NOTE — Progress Notes (Signed)
Deflated femstop per protocol of 81mm at 1830 and 13mm at 1835. No bleeding observed after complete deflation of femestop. Will continue to monitor.   ?

## 2021-05-01 NOTE — Progress Notes (Signed)
Took over patient care at 1815.  ?

## 2021-05-02 ENCOUNTER — Encounter (HOSPITAL_COMMUNITY): Payer: Self-pay | Admitting: Cardiology

## 2021-05-14 ENCOUNTER — Telehealth (HOSPITAL_COMMUNITY): Payer: Self-pay

## 2021-05-14 ENCOUNTER — Other Ambulatory Visit (HOSPITAL_COMMUNITY): Payer: Self-pay

## 2021-05-14 NOTE — Telephone Encounter (Signed)
Transitions of Care Pharmacy  ° °Call attempted for a pharmacy transitions of care follow-up. HIPAA appropriate voicemail was left with call back information provided.  ° °Call attempt #1. Will follow-up in 2-3 days.  °  °

## 2021-05-17 ENCOUNTER — Telehealth (HOSPITAL_COMMUNITY): Payer: Self-pay

## 2021-05-17 NOTE — Telephone Encounter (Signed)
Transitions of Care Pharmacy  ? ?Call attempted for a pharmacy transitions of care follow-up. HIPAA appropriate voicemail was left with call back information provided.  ? ?Call attempt #3. Will follow-up in 2-3 days.  ? ?Lennie Muckle, PharmD Candidate ? ? ?

## 2021-05-22 ENCOUNTER — Telehealth (HOSPITAL_COMMUNITY): Payer: Self-pay

## 2021-05-22 ENCOUNTER — Ambulatory Visit: Payer: Medicare Other

## 2021-05-22 DIAGNOSIS — I739 Peripheral vascular disease, unspecified: Secondary | ICD-10-CM

## 2021-05-22 NOTE — Telephone Encounter (Signed)
Transitions of Care Pharmacy   Call attempted for a pharmacy transitions of care follow-up. HIPAA appropriate voicemail was left with call back information provided.   Call attempt #3.   

## 2021-05-23 NOTE — Telephone Encounter (Signed)
Transitions of Care Pharmacy  ? ?Call attempted for a pharmacy transitions of care follow-up. ? ? ?

## 2021-05-30 DIAGNOSIS — H401113 Primary open-angle glaucoma, right eye, severe stage: Secondary | ICD-10-CM | POA: Diagnosis not present

## 2021-06-01 ENCOUNTER — Ambulatory Visit: Payer: Medicare Other | Admitting: Cardiology

## 2021-06-01 ENCOUNTER — Encounter: Payer: Self-pay | Admitting: Cardiology

## 2021-06-01 VITALS — BP 133/61 | HR 70 | Temp 97.8°F | Resp 16 | Ht 62.0 in | Wt 130.0 lb

## 2021-06-01 DIAGNOSIS — E7800 Pure hypercholesterolemia, unspecified: Secondary | ICD-10-CM | POA: Diagnosis not present

## 2021-06-01 DIAGNOSIS — I739 Peripheral vascular disease, unspecified: Secondary | ICD-10-CM

## 2021-06-01 DIAGNOSIS — R001 Bradycardia, unspecified: Secondary | ICD-10-CM

## 2021-06-01 NOTE — Patient Instructions (Signed)
Please do not forget to get blood work done sometime in November 2023 prior to her office visit with me.  Go to [facility], orders have been placed.

## 2021-06-01 NOTE — Progress Notes (Signed)
Primary Physician/Referring:  Iona Beard, MD  [name], male    DOB: 1945/12/01, 76 y.o.   MRN: [id number]  Chief Complaint  Patient presents with   PAD   Follow-up    6 week   HPI:    Timothy Webster  is a 76 y.o. AAM male  with PAD, hyperlipidemia, tobacco use disorder which he has quit in May 2017, PAD here for 25-monthfollow-up visit.  Patient has had left SFA and popliteal artery angioplasty in 2017 and again in 2021 and left common iliac and external iliac artery self-expanding stents in 2017 and 2021.  Right SFA atherectomy followed by balloon angioplasty in 2021.  He was having severe symptoms of right hip claudication. Patient underwent peripheral arteriogram and complex angioplasty on 05/01/2021 to his right iliac and also common femoral artery.  Symptoms of right hip claudication has resolved.  He still has mild symptoms of claudication involving his bilateral calves.  Otherwise feeling well and states that he has not had any complications from the procedure.  Wife present.  Past Medical History:  Diagnosis Date   Glaucoma 02/25/2018   Hyperlipidemia    Hypertension    PAD (peripheral artery disease) (HCC)    Sinus bradycardia on ECG 02/25/2018   EKG 10/12/2015: Marked sinus bradycardia at rate of 44 bpm, normal axis. No evidence of ischemia otherwise normal EKG. No significant change from EKG 04/19/2015: Normal sinus rhythm at rate of 52 bpm, EKG 12/24/2013: Marked sinus bradycardia at the rate of 39 bpm.   Tobacco use disorder, severe, dependence 02/25/2018    Family History  Problem Relation Age of Onset   Hypertension Mother    Hyperlipidemia Mother    Heart disease Mother    Social History   Tobacco Use   Smoking status: Former    Packs/day: 1.00    Years: 15.00    Pack years: 15.00    Types: Cigarettes    Quit date: 07/17/2015    Years since quitting: 5.8   Smokeless tobacco: Never  Substance Use Topics   Alcohol use: No   Marital Status:  Married ROS  Review of Systems  Cardiovascular:  Positive for claudication (stable) and dyspnea on exertion (stable). Negative for chest pain and leg swelling.  Gastrointestinal:  Negative for heartburn and melena.  Neurological:  Positive for paresthesias. Negative for dizziness.  Objective      06/01/2021    1:16 PM 05/01/2021    7:25 PM 05/01/2021    7:20 PM  Vitals with BMI  Height _0     Weight 130 lbs    BMI 232.99   Systolic 1242 1683 Diastolic 61  67  Pulse 70 68 67    Physical Exam Vitals reviewed.  Constitutional:      Appearance: He is well-developed.     Comments: petite  Neck:     Thyroid: No thyromegaly.     Vascular: No JVD.  Cardiovascular:     Rate and Rhythm: Normal rate and regular rhythm.     Pulses:          Carotid pulses are 2+ on the right side and 2+ on the left side.      Femoral pulses are 2+ on the right side with bruit and 1+ on the left side with bruit.      Popliteal pulses are 1+ on the right side and 1+ on the left side.  Dorsalis pedis pulses are 0 on the right side and 1+ on the left side.       Posterior tibial pulses are 1+ on the right side and 1+ on the left side.     Heart sounds: Normal heart sounds. No murmur heard.   No gallop.  Pulmonary:     Effort: Pulmonary effort is normal.     Breath sounds: No wheezing or rales.  Abdominal:     General: Bowel sounds are normal.     Palpations: Abdomen is soft.  Musculoskeletal:     Right lower leg: No edema.     Left lower leg: No edema.   Laboratory examination:   Recent Labs    05/01/21 0603  NA 141  K 3.8  CL 112*  CO2 22  GLUCOSE 105*  BUN 13  CREATININE 1.04  CALCIUM 9.1  GFRNONAA >60    CrCl cannot be calculated (Patient's most recent lab result is older than the maximum 21 days allowed.).     Latest Ref Rng & Units 05/01/2021    6:03 AM 07/21/2019    3:58 AM 07/15/2019    1:46 PM  CMP  Glucose 70 - 99 mg/dL 105   94   83    BUN 8 - 23 mg/dL _0 Creatinine 0.61 - 1.24 mg/dL 1.04   0.90   0.83    Sodium 135 - 145 mmol/L 141   139   142    Potassium 3.5 - 5.1 mmol/L 3.8   3.8   4.6    Chloride 98 - 111 mmol/L 112   113   105    CO2 22 - 32 mmol/L _1 Calcium 8.9 - 10.3 mg/dL 9.1   7.9   9.9        Latest Ref Rng & Units 04/26/2021    8:28 AM 11/08/2019   10:34 AM 07/21/2019    9:55 AM  CBC  WBC 3.4 - 10.8 x10E3/uL 5.0   6.4     Hemoglobin 13.0 - 17.7 g/dL 12.5   14.0   7.9    Hematocrit 37.5 - 51.0 % 38.1   44.0   25.1    Platelets 150 - 450 x10E3/uL 199   230       External Labs:  Cholesterol, total 134.000 m 11/07/2020 HDL 68.000 mg 11/07/2020 LDL 52.000 mg 11/07/2020 Triglycerides 61.000 mg 11/07/2020  Hemoglobin 14.000 G/ 11/08/2019 Platelets 230.000 x1 11/08/2019  Creatinine, Serum 0.930 mg/ 11/07/2020 Potassium 3.900 mm 11/07/2020 ALT (SGPT) 19.000 U/L 11/07/2020  TSH 1.110 10/30/2018  Allergies  No Known Allergies    Medications    Current Outpatient Medications:    acetaminophen (TYLENOL) 500 MG tablet, Take 1,000 mg by mouth every 6 (six) hours as needed (pain)., Disp: , Rfl:    amLODipine-benazepril (LOTREL) 10-40 MG capsule, Take 1 capsule by mouth once daily, Disp: 90 capsule, Rfl: 0   aspirin EC 81 MG tablet, Take 81 mg by mouth in the morning., Disp: , Rfl:    brimonidine (ALPHAGAN) 0.2 % ophthalmic solution, Place 1 drop into the right eye 2 (two) times daily., Disp: , Rfl:    cilostazol (PLETAL) 100 MG tablet, Take 1 tablet by mouth twice daily, Disp: 180 tablet, Rfl: 0   dorzolamide-timolol (COSOPT) 22.3-6.8 MG/ML ophthalmic solution, Place 1 drop into the right eye 2 (two) times daily., Disp: , Rfl:  ezetimibe (ZETIA) 10 MG tablet, Take 1 tablet (10 mg total) by mouth daily., Disp: 90 tablet, Rfl: 3   gabapentin (NEURONTIN) 300 MG capsule, Take 1 capsule (300 mg total) by mouth in the morning, at noon, and at bedtime. Try three times a day to see if symptoms of leg pain improves, Disp:  180 capsule, Rfl: 3   ketorolac (ACULAR) 0.5 % ophthalmic solution, Place 1 drop into the right eye 4 (four) times daily., Disp: , Rfl:    latanoprost (XALATAN) 0.005 % ophthalmic solution, Place 1 drop into the right eye at bedtime., Disp: , Rfl:    Menthol-Methyl Salicylate (MUSCLE RUB) 10-15 % CREA, Apply 1 application. topically 4 (four) times daily as needed for muscle pain., Disp: , Rfl:    nitroGLYCERIN (NITROSTAT) 0.4 MG SL tablet, DISSOLVE ONE TABLET UNDER THE TONGUE EVERY 5 MINUTES AS NEEDED FOR CHEST PAIN.  DO NOT EXCEED A TOTAL OF 3 DOSES IN 15 MINUTES, Disp: 25 tablet, Rfl: 0   rosuvastatin (CRESTOR) 20 MG tablet, Take 1 tablet by mouth once daily (Patient taking differently: Take 20 mg by mouth every evening.), Disp: 90 tablet, Rfl: 0   Radiology:   CT Angio Chest 02/17/2016: 1. No evidence of aortic dissection. No evidence of aneurysmaldilatation. 2. No evidence of significant pulmonary embolus. 3. Scattered calcific atherosclerotic disease along the abdominal aorta and its branches, with likely mild to moderate luminal narrowing along the external and internal iliac arteries bilaterally. 4. **An incidental finding of potential clinical significance has been found. 1.9 cm hypodense nodule at the right thyroid lobe. 5. Scattered coronary artery calcifications seen. 6. Minimal emphysema at the lung apices.  Lungs otherwise clear. 7. Small umbilical hernia, containing only fat. 8. Mild degenerative change along the lumbar spine.  Cardiac Studies:   Exercise Myoview stress test 03/23/2018: 1. The patient performed treadmill exercise using Bruce protocol, completing 5:50 minutes. The patient completed an estimated workload of 7 METS, reaching 83% of the maximum predicted heart rate. Exercise capacity was low. Hemodynamic response was normal. No stress symptoms reported. No ischemic changes seen on stress electrocardiogram.  Marginally sub-maximal stress test. 2.  The overall quality  of the study is good. There is no evidence of abnormal lung activity. Stress and rest SPECT images demonstrate homogeneous tracer distribution throughout the myocardium. Gated SPECT imaging reveals normal myocardial thickening and wall motion. The left ventricular ejection fraction was calculated 46%, although visually appears normal.    3. Low risk study. Marginally sub-maximal study. Clinical correlation recommended.   Peripheral arteriogram:  Left SFA and popliteal artery atherectomy and balloon angioplasty in 2017, focal restenosis angioplasty on 06/15/2019 in the mid segment of popliteal artery.  Iliac artery stenting at the same time.  Left common iliac and external iliac artery stenting with absolute Pro 9.0 x 60 mm followed by overlapping 9.0 x 16 and a 9.0 x 20 mm self-expanding stents on 06/15/2019  Right mid SFA and right popliteal artery atherectomy and drug-coated balloon angioplasty 07/20/2019   Lower Extremity Arterial Duplex 02/15/2021: Severely dampened monophasic flow in mid to distal SFA suggests subtotal occlusion. Right AT is occluded. Severely  dampened flow throughout the right lower extremity below the origin of SFA. There is severe diffuse calcific plaque throughout the right lower extremity.   Moderate velocity increase at the left profunda femoral artery and distal popliteal arteries suggests hemodynamically significant stenosis of >50%. There is severe diffuse calcific plaque throughout the left lower extremity.  This exam reveals moderately  decreased perfusion of the right lower extremity, noted at the post tibial artery level (ABI 0.71) and mildly decreased perfusion of the left lower extremity, noted at the post tibial artery level (ABI 0.80).  Compared to the study done on 08/17/2020, no change in right ABI, left ABI has improved from 0.57.  Left popliteal artery stenosis is new. Study suggests patent left iliac artery stent, restenosis in the right SFA and left popliteal  artery from prior angioplasty.  Peripheral arteriogram 05/01/2021: Abdominal aorta reveals 2 renal arteries 1 on each side, widely patent.  Entire abdominal aorta has mild diffuse disease.  Aortoiliac bifurcation widely patent.  Left iliac artery and left common femoral artery show mild diffuse disease, previously placed stent in the left common Iliac artery are widely patent.  Left common femoral artery and profundofemoral artery are also widely patent.   Right iliac artery with distal runoff: Right common iliac artery is widely patent except a small segment of the distal vessel which is a 80% stenosis and the stenosis extends into the right proximal external iliac artery.  There was a 50 to 60 mmHg pressure gradient.  Calcification was circumferential and severe.  Right external iliac artery otherwise showed mild disease angiographically but mid to distal segment had a 50 to 60% gradient as well.  Right common femoral artery showed a calcific 80% stenosis, right profunda at the ostium has a 90% calcific stenosis.  Right SFA in the Hunter's canal is 100% occluded, calcific with extensive collaterals.  Right popliteal artery has a focal 80% to 90% stenosis followed by AT which is occluded there is chronic and two-vessel runoff in the form of peroneal and large PT.  Successful lithotripsy using 8.0 x 60 mm shockwave lithotripsy balloon with 0 mmHg pressure gradient across the right common femoral and right distal external iliac artery and the mid segment of the right external iliac artery was stented due to dissection with a 8.0 x 60 mm Innova self-expanding stent.  There was a very focal right common iliac artery dissection that was left alone.  The brisk flow was evident throughout the right common, external, internal iliac artery and also right common femoral artery and SFA.  ABI 05/22/2021: This exam reveals moderately decreased perfusion of the right lower extremity, noted at the post tibial  artery level (ABI 0.75).   This exam reveals moderately decreased perfusion of the left lower extremity, noted at the post tibial artery level (ABI 0.78).  Markedly abnormal monophasic and dampened waveform at the level of the ankles bilaterally. Compared to 11/17/2019, Right ABI 0.65 and left ABI 0.59.  EKG   EKG 06/01/2021: Normal sinus rhythm with rate of 68 bpm, normal axis, no evidence of ischemia, normal EKG. No significant change from EKG 04/23/2021.   Assessment     ICD-10-CM   1. Claudication in peripheral vascular disease (HCC)  I73.9 EKG 12-Lead    CMP14+EGFR    CBC    PCV ANKLE BRACHIAL INDEX (ABI)    CBC    2. Sinus bradycardia on ECG  R00.1     3. Pure hypercholesterolemia  E78.00 Lipid Panel With LDL/HDL Ratio    Lipid Panel With LDL/HDL Ratio      No orders of the defined types were placed in this encounter.  Medications Discontinued During This Encounter  Medication Reason   clopidogrel (PLAVIX) 75 MG tablet Completed Course     Orders Placed This Encounter  Procedures   CMP14+EGFR   Lipid Panel With  LDL/HDL Ratio    Please do not draw until required date v=beyond 12/01/2021    Standing Status:   Future    Number of Occurrences:   1    Standing Expiration Date:   06/02/2022   CBC    Please do not draw until required date v=beyond 12/01/2021    Standing Status:   Future    Number of Occurrences:   1    Standing Expiration Date:   06/02/2022   EKG 12-Lead    Recommendations:   Timothy Webster  is a 76 y.o.  AAM male  with PAD, hyperlipidemia, tobacco use disorder which he has quit in May 2017, PAD here for 43-monthfollow-up visit.  Patient has had left SFA and popliteal artery angioplasty in 2017 and again in 2021 and left common iliac and external iliac artery self-expanding stents in 2017 and 2021.  Right SFA atherectomy followed by balloon angioplasty in 2021.  Patient underwent peripheral arteriogram and angioplasty on 05/01/2021 to heavily calcified and  complex but successful angioplasty with EVL shockwave balloon for right external iliac and common femoral artery and stenting of the right external iliac artery with self-expanding stent.  Right SFA is occluded but has extensive collaterals and two-vessel runoff below the knee.  Since then he has noticed improvement in his right hip claudication.  He still has mild right calf and also left calf claudication but overall states that he is feeling remarkably well and has started to be active again.  He is presently on aspirin and also cilostazol for claudication.  I will discontinue Plavix.  Bilateral ABIs improved since peripheral angiography and angioplasty.  He will need repeat ABI in 6 months.  We will obtain routine labs prior to his next office visit in 6 months including CBC, CMP and lipids.  Otherwise blood pressure is well controlled, no clinical evidence of heart failure, continue present medications. ns.   JAdrian Prows PA-C 06/01/2021, 5:53 PM Office: [phone]

## 2021-06-02 LAB — CMP14+EGFR
ALT: 17 IU/L (ref 0–44)
AST: 28 IU/L (ref 0–40)
Albumin/Globulin Ratio: 1.6 (ref 1.2–2.2)
Albumin: 5.1 g/dL — ABNORMAL HIGH (ref 3.7–4.7)
Alkaline Phosphatase: 86 IU/L (ref 44–121)
BUN/Creatinine Ratio: 15 (ref 10–24)
BUN: 15 mg/dL (ref 8–27)
Bilirubin Total: 0.3 mg/dL (ref 0.0–1.2)
CO2: 16 mmol/L — ABNORMAL LOW (ref 20–29)
Calcium: 10.3 mg/dL — ABNORMAL HIGH (ref 8.6–10.2)
Chloride: 107 mmol/L — ABNORMAL HIGH (ref 96–106)
Creatinine, Ser: 1.03 mg/dL (ref 0.76–1.27)
Globulin, Total: 3.2 g/dL (ref 1.5–4.5)
Glucose: 97 mg/dL (ref 70–99)
Potassium: 4 mmol/L (ref 3.5–5.2)
Sodium: 145 mmol/L — ABNORMAL HIGH (ref 134–144)
Total Protein: 8.3 g/dL (ref 6.0–8.5)
eGFR: 75 mL/min/{1.73_m2} (ref >59)

## 2021-06-02 LAB — CBC
Hematocrit: 39.2 % (ref 37.5–51.0)
Hemoglobin: 13.1 g/dL (ref 13.0–17.7)
MCH: 30.7 pg (ref 26.6–33.0)
MCHC: 33.4 g/dL (ref 31.5–35.7)
MCV: 92 fL (ref 79–97)
Platelets: 200 10*3/uL (ref 150–450)
RBC: 4.27 x10E6/uL (ref 4.14–5.80)
RDW: 13 % (ref 11.6–15.4)
WBC: 6.1 10*3/uL (ref 3.4–10.8)

## 2021-06-02 LAB — LIPID PANEL WITH LDL/HDL RATIO
Cholesterol, Total: 154 mg/dL (ref 100–199)
HDL: 64 mg/dL (ref >39)
LDL Chol Calc (NIH): 74 mg/dL (ref 0–99)
LDL/HDL Ratio: 1.2 ratio (ref 0.0–3.6)
Triglycerides: 86 mg/dL (ref 0–149)
VLDL Cholesterol Cal: 16 mg/dL (ref 5–40)

## 2021-06-21 ENCOUNTER — Other Ambulatory Visit: Payer: Self-pay | Admitting: Cardiology

## 2021-06-21 DIAGNOSIS — E7800 Pure hypercholesterolemia, unspecified: Secondary | ICD-10-CM

## 2021-06-21 DIAGNOSIS — I739 Peripheral vascular disease, unspecified: Secondary | ICD-10-CM

## 2021-06-25 DIAGNOSIS — M545 Low back pain, unspecified: Secondary | ICD-10-CM | POA: Diagnosis not present

## 2021-06-25 DIAGNOSIS — I739 Peripheral vascular disease, unspecified: Secondary | ICD-10-CM | POA: Diagnosis not present

## 2021-06-25 DIAGNOSIS — I2510 Atherosclerotic heart disease of native coronary artery without angina pectoris: Secondary | ICD-10-CM | POA: Diagnosis not present

## 2021-06-25 DIAGNOSIS — J432 Centrilobular emphysema: Secondary | ICD-10-CM | POA: Diagnosis not present

## 2021-06-25 DIAGNOSIS — I10 Essential (primary) hypertension: Secondary | ICD-10-CM | POA: Diagnosis not present

## 2021-06-25 DIAGNOSIS — Z0000 Encounter for general adult medical examination without abnormal findings: Secondary | ICD-10-CM | POA: Diagnosis not present

## 2021-07-06 DIAGNOSIS — I10 Essential (primary) hypertension: Secondary | ICD-10-CM | POA: Diagnosis not present

## 2021-07-06 DIAGNOSIS — I739 Peripheral vascular disease, unspecified: Secondary | ICD-10-CM | POA: Diagnosis not present

## 2021-07-16 ENCOUNTER — Other Ambulatory Visit: Payer: Self-pay | Admitting: Cardiology

## 2021-07-16 DIAGNOSIS — I10 Essential (primary) hypertension: Secondary | ICD-10-CM

## 2021-07-27 DIAGNOSIS — H401113 Primary open-angle glaucoma, right eye, severe stage: Secondary | ICD-10-CM | POA: Diagnosis not present

## 2021-08-06 DIAGNOSIS — I10 Essential (primary) hypertension: Secondary | ICD-10-CM | POA: Diagnosis not present

## 2021-08-06 DIAGNOSIS — Z87891 Personal history of nicotine dependence: Secondary | ICD-10-CM | POA: Diagnosis not present

## 2021-08-06 DIAGNOSIS — I209 Angina pectoris, unspecified: Secondary | ICD-10-CM | POA: Diagnosis not present

## 2021-08-30 ENCOUNTER — Other Ambulatory Visit: Payer: Self-pay | Admitting: Cardiology

## 2021-08-30 DIAGNOSIS — G63 Polyneuropathy in diseases classified elsewhere: Secondary | ICD-10-CM

## 2021-09-12 ENCOUNTER — Other Ambulatory Visit: Payer: Self-pay | Admitting: Cardiology

## 2021-09-12 DIAGNOSIS — I10 Essential (primary) hypertension: Secondary | ICD-10-CM

## 2021-09-12 DIAGNOSIS — E7800 Pure hypercholesterolemia, unspecified: Secondary | ICD-10-CM

## 2021-10-29 DIAGNOSIS — I2510 Atherosclerotic heart disease of native coronary artery without angina pectoris: Secondary | ICD-10-CM | POA: Diagnosis not present

## 2021-10-29 DIAGNOSIS — I119 Hypertensive heart disease without heart failure: Secondary | ICD-10-CM | POA: Diagnosis not present

## 2021-10-29 DIAGNOSIS — I739 Peripheral vascular disease, unspecified: Secondary | ICD-10-CM | POA: Diagnosis not present

## 2021-10-29 DIAGNOSIS — Z23 Encounter for immunization: Secondary | ICD-10-CM | POA: Diagnosis not present

## 2021-10-29 DIAGNOSIS — I10 Essential (primary) hypertension: Secondary | ICD-10-CM | POA: Diagnosis not present

## 2021-10-29 DIAGNOSIS — M545 Low back pain, unspecified: Secondary | ICD-10-CM | POA: Diagnosis not present

## 2021-10-29 DIAGNOSIS — J432 Centrilobular emphysema: Secondary | ICD-10-CM | POA: Diagnosis not present

## 2021-11-05 DIAGNOSIS — H4051X3 Glaucoma secondary to other eye disorders, right eye, severe stage: Secondary | ICD-10-CM | POA: Diagnosis not present

## 2021-11-05 DIAGNOSIS — H44522 Atrophy of globe, left eye: Secondary | ICD-10-CM | POA: Diagnosis not present

## 2021-11-05 DIAGNOSIS — H35351 Cystoid macular degeneration, right eye: Secondary | ICD-10-CM | POA: Diagnosis not present

## 2021-11-26 ENCOUNTER — Other Ambulatory Visit: Payer: Medicare Other

## 2021-11-27 ENCOUNTER — Ambulatory Visit: Payer: Medicare Other

## 2021-11-27 DIAGNOSIS — I739 Peripheral vascular disease, unspecified: Secondary | ICD-10-CM

## 2021-11-30 ENCOUNTER — Other Ambulatory Visit: Payer: Self-pay | Admitting: Cardiology

## 2021-11-30 DIAGNOSIS — I739 Peripheral vascular disease, unspecified: Secondary | ICD-10-CM

## 2021-11-30 DIAGNOSIS — E7800 Pure hypercholesterolemia, unspecified: Secondary | ICD-10-CM

## 2021-11-30 DIAGNOSIS — I10 Essential (primary) hypertension: Secondary | ICD-10-CM

## 2021-12-03 ENCOUNTER — Other Ambulatory Visit: Payer: Self-pay | Admitting: Cardiology

## 2021-12-03 DIAGNOSIS — E7800 Pure hypercholesterolemia, unspecified: Secondary | ICD-10-CM | POA: Diagnosis not present

## 2021-12-04 LAB — CBC
Hematocrit: 37.7 % (ref 37.5–51.0)
Hemoglobin: 12.3 g/dL — ABNORMAL LOW (ref 13.0–17.7)
MCH: 30.8 pg (ref 26.6–33.0)
MCHC: 32.6 g/dL (ref 31.5–35.7)
MCV: 95 fL (ref 79–97)
Platelets: 217 10*3/uL (ref 150–450)
RBC: 3.99 x10E6/uL — ABNORMAL LOW (ref 4.14–5.80)
RDW: 13.1 % (ref 11.6–15.4)
WBC: 5.6 10*3/uL (ref 3.4–10.8)

## 2021-12-04 LAB — LIPID PANEL WITH LDL/HDL RATIO
Cholesterol, Total: 160 mg/dL (ref 100–199)
HDL: 67 mg/dL (ref >39)
LDL Chol Calc (NIH): 81 mg/dL (ref 0–99)
LDL/HDL Ratio: 1.2 ratio (ref 0.0–3.6)
Triglycerides: 59 mg/dL (ref 0–149)
VLDL Cholesterol Cal: 12 mg/dL (ref 5–40)

## 2021-12-04 NOTE — Progress Notes (Signed)
You are seeing him soon. Also I reviewed his ABI (placed it in stick note for your to cut), very stable

## 2021-12-07 ENCOUNTER — Ambulatory Visit: Payer: Medicare Other

## 2021-12-07 VITALS — BP 120/62 | HR 58 | Ht 62.5 in | Wt 125.6 lb

## 2021-12-07 DIAGNOSIS — I739 Peripheral vascular disease, unspecified: Secondary | ICD-10-CM | POA: Diagnosis not present

## 2021-12-07 DIAGNOSIS — I10 Essential (primary) hypertension: Secondary | ICD-10-CM | POA: Diagnosis not present

## 2021-12-07 DIAGNOSIS — G63 Polyneuropathy in diseases classified elsewhere: Secondary | ICD-10-CM | POA: Diagnosis not present

## 2021-12-07 DIAGNOSIS — G6289 Other specified polyneuropathies: Secondary | ICD-10-CM

## 2021-12-07 NOTE — Progress Notes (Signed)
Primary Physician/Referring:  Iona Beard, MD  [name], male    DOB: April 25, 1945, 76 y.o.   MRN: [id number]  Chief Complaint  Patient presents with   Follow-up    6 MONTH   PAD   Hypertension   Hyperlipidemia   HPI:    Timothy Webster  is a 76 y.o. AAM male  with PAD, hyperlipidemia, tobacco use disorder which he has quit in May 2017, PAD here for 77-month follow-up visit.  Patient has had left SFA and popliteal artery angioplasty in 2017 and again in 2021 and left common iliac and external iliac artery self-expanding stents in 2017 and 2021.  Right SFA atherectomy followed by balloon angioplasty in 2021.  He was having severe symptoms of right hip claudication. Patient underwent peripheral arteriogram and complex angioplasty on 05/01/2021 to his right iliac and also common femoral artery.  Symptoms of right hip claudication has resolved.  He still has mild symptoms of claudication involving his bilateral calves.  Otherwise feeling well and states that he has not had any complications from the procedure.  He denies chest pain, worsening shortness of breath, palpitations, leg edema, orthopnea, PND, syncope.   Past Medical History:  Diagnosis Date   Glaucoma 02/25/2018   Hyperlipidemia    Hypertension    PAD (peripheral artery disease) (HCC)    Sinus bradycardia on ECG 02/25/2018   EKG 10/12/2015: Marked sinus bradycardia at rate of 44 bpm, normal axis. No evidence of ischemia otherwise normal EKG. No significant change from EKG 04/19/2015: Normal sinus rhythm at rate of 52 bpm, EKG 12/24/2013: Marked sinus bradycardia at the rate of 39 bpm.   Tobacco use disorder, severe, dependence 02/25/2018    Family History  Problem Relation Age of Onset   Hypertension Mother    Hyperlipidemia Mother    Heart disease Mother    Social History   Tobacco Use   Smoking status: Former    Packs/day: 1.00    Years: 15.00    Total pack years: 15.00    Types: Cigarettes    Quit  date: 07/17/2015    Years since quitting: 6.3   Smokeless tobacco: Never  Substance Use Topics   Alcohol use: No   Marital Status: Married ROS  Review of Systems  Cardiovascular:  Positive for claudication (stable) and dyspnea on exertion (stable). Negative for chest pain and leg swelling.  Gastrointestinal:  Negative for heartburn and melena.  Neurological:  Positive for paresthesias. Negative for dizziness.   Objective      12/07/2021   11:07 AM 06/01/2021    1:16 PM 05/01/2021    7:25 PM  Vitals with BMI  Height 5' 2.5" 5\' 2"    Weight 125 lbs 10 oz 130 lbs   BMI 0000000 [id number]   Systolic [id number]   Diastolic 62 61   Pulse 58 70 68    Physical Exam Vitals reviewed.  Constitutional:      Appearance: He is well-developed.  Neck:     Thyroid: No thyromegaly.     Vascular: No JVD.  Cardiovascular:     Rate and Rhythm: Normal rate and regular rhythm.     Pulses:          Carotid pulses are 2+ on the right side and 2+ on the left side.      Femoral pulses are 2+ on the right side with bruit and 1+ on the left side with bruit.      Popliteal pulses  are 1+ on the right side and 1+ on the left side.       Dorsalis pedis pulses are 0 on the right side and 1+ on the left side.       Posterior tibial pulses are 1+ on the right side and 1+ on the left side.     Heart sounds: Normal heart sounds. No murmur heard.    No gallop.  Pulmonary:     Effort: Pulmonary effort is normal. No respiratory distress.     Breath sounds: No wheezing or rales.  Abdominal:     General: Bowel sounds are normal.     Palpations: Abdomen is soft.  Musculoskeletal:     Right lower leg: No edema.     Left lower leg: No edema.    Laboratory examination:   Recent Labs    05/01/21 0603 06/01/21 1440  NA 141 145*  K 3.8 4.0  CL 112* 107*  CO2 22 16*  GLUCOSE 105* 97  BUN 13 15  CREATININE 1.04 1.03  CALCIUM 9.1 10.3*  GFRNONAA >60  --     CrCl cannot be calculated (Patient's most recent lab  result is older than the maximum 21 days allowed.).     Latest Ref Rng & Units 06/01/2021    2:40 PM 05/01/2021    6:03 AM 07/21/2019    3:58 AM  CMP  Glucose 70 - 99 mg/dL 97  105  94   BUN 8 - 27 mg/dL 15  13  11    Creatinine 0.76 - 1.27 mg/dL 1.03  1.04  0.90   Sodium 134 - 144 mmol/L 145  141  139   Potassium 3.5 - 5.2 mmol/L 4.0  3.8  3.8   Chloride 96 - 106 mmol/L 107  112  113   CO2 20 - 29 mmol/L 16  22  20    Calcium 8.6 - 10.2 mg/dL 10.3  9.1  7.9   Total Protein 6.0 - 8.5 g/dL 8.3     Total Bilirubin 0.0 - 1.2 mg/dL 0.3     Alkaline Phos 44 - 121 IU/L 86     AST 0 - 40 IU/L 28     ALT 0 - 44 IU/L 17         Latest Ref Rng & Units 12/03/2021    9:16 AM 06/01/2021    2:45 PM 04/26/2021    8:28 AM  CBC  WBC 3.4 - 10.8 x10E3/uL 5.6  6.1  5.0   Hemoglobin 13.0 - 17.7 g/dL 12.3  13.1  12.5   Hematocrit 37.5 - 51.0 % 37.7  39.2  38.1   Platelets 150 - 450 x10E3/uL 217  200  199     External Labs:  Cholesterol, total 134.000 m 11/07/2020 HDL 68.000 mg 11/07/2020 LDL 52.000 mg 11/07/2020 Triglycerides 61.000 mg 11/07/2020  Hemoglobin 14.000 G/ 11/08/2019 Platelets 230.000 x1 11/08/2019  Creatinine, Serum 0.930 mg/ 11/07/2020 Potassium 3.900 mm 11/07/2020 ALT (SGPT) 19.000 U/L 11/07/2020  TSH 1.110 10/30/2018  Allergies  No Known Allergies    Medications    Current Outpatient Medications:    acetaminophen (TYLENOL) 500 MG tablet, Take 1,000 mg by mouth every 6 (six) hours as needed (pain)., Disp: , Rfl:    amLODipine-benazepril (LOTREL) 10-40 MG capsule, Take 1 capsule by mouth once daily, Disp: 90 capsule, Rfl: 0   aspirin EC 81 MG tablet, Take 81 mg by mouth in the morning., Disp: , Rfl:    brimonidine (ALPHAGAN) 0.2 %  ophthalmic solution, Place 1 drop into the right eye 2 (two) times daily., Disp: , Rfl:    cilostazol (PLETAL) 100 MG tablet, Take 1 tablet by mouth twice daily, Disp: 180 tablet, Rfl: 0   dorzolamide-timolol (COSOPT) 22.3-6.8 MG/ML ophthalmic  solution, Place 1 drop into the right eye 2 (two) times daily., Disp: , Rfl:    ezetimibe (ZETIA) 10 MG tablet, Take 1 tablet by mouth once daily, Disp: 90 tablet, Rfl: 0   gabapentin (NEURONTIN) 300 MG capsule, TAKE 1 CAPSULE BY MOUTH IN THE MORNING , AT NOON AND AT BEDTIME TRY 3 TIMES A DAY TO SEE IF SYMPTOMS OF LEG PAIN IMPROVES, Disp: 180 capsule, Rfl: 0   ketorolac (ACULAR) 0.5 % ophthalmic solution, Place 1 drop into the right eye 4 (four) times daily., Disp: , Rfl:    latanoprost (XALATAN) 0.005 % ophthalmic solution, Place 1 drop into the right eye at bedtime., Disp: , Rfl:    Menthol-Methyl Salicylate (MUSCLE RUB) 10-15 % CREA, Apply 1 application. topically 4 (four) times daily as needed for muscle pain., Disp: , Rfl:    nitroGLYCERIN (NITROSTAT) 0.4 MG SL tablet, DISSOLVE ONE TABLET UNDER THE TONGUE EVERY 5 MINUTES AS NEEDED FOR CHEST PAIN.  DO NOT EXCEED A TOTAL OF 3 DOSES IN 15 MINUTES, Disp: 25 tablet, Rfl: 0   rosuvastatin (CRESTOR) 20 MG tablet, Take 1 tablet by mouth once daily, Disp: 90 tablet, Rfl: 0   Radiology:   CT Angio Chest 02/17/2016: 1. No evidence of aortic dissection. No evidence of aneurysmaldilatation. 2. No evidence of significant pulmonary embolus. 3. Scattered calcific atherosclerotic disease along the abdominal aorta and its branches, with likely mild to moderate luminal narrowing along the external and internal iliac arteries bilaterally. 4. **An incidental finding of potential clinical significance has been found. 1.9 cm hypodense nodule at the right thyroid lobe. 5. Scattered coronary artery calcifications seen. 6. Minimal emphysema at the lung apices.  Lungs otherwise clear. 7. Small umbilical hernia, containing only fat. 8. Mild degenerative change along the lumbar spine.  Cardiac Studies:   Exercise Myoview stress test 03/23/2018: 1. The patient performed treadmill exercise using Bruce protocol, completing 5:50 minutes. The patient completed an  estimated workload of 7 METS, reaching 83% of the maximum predicted heart rate. Exercise capacity was low. Hemodynamic response was normal. No stress symptoms reported. No ischemic changes seen on stress electrocardiogram.  Marginally sub-maximal stress test. 2.  The overall quality of the study is good. There is no evidence of abnormal lung activity. Stress and rest SPECT images demonstrate homogeneous tracer distribution throughout the myocardium. Gated SPECT imaging reveals normal myocardial thickening and wall motion. The left ventricular ejection fraction was calculated 46%, although visually appears normal.    3. Low risk study. Marginally sub-maximal study. Clinical correlation recommended.   Peripheral arteriogram:  Left SFA and popliteal artery atherectomy and balloon angioplasty in 2017, focal restenosis angioplasty on 06/15/2019 in the mid segment of popliteal artery.  Iliac artery stenting at the same time.  Left common iliac and external iliac artery stenting with absolute Pro 9.0 x 60 mm followed by overlapping 9.0 x 16 and a 9.0 x 20 mm self-expanding stents on 06/15/2019  Right mid SFA and right popliteal artery atherectomy and drug-coated balloon angioplasty 07/20/2019  Lower Extremity Arterial Duplex 02/15/2021: Severely dampened monophasic flow in mid to distal SFA suggests subtotal occlusion. Right AT is occluded. Severely  dampened flow throughout the right lower extremity below the origin of SFA. There is  severe diffuse calcific plaque throughout the right lower extremity.   Moderate velocity increase at the left profunda femoral artery and distal popliteal arteries suggests hemodynamically significant stenosis of >50%. There is severe diffuse calcific plaque throughout the left lower extremity.  This exam reveals moderately decreased perfusion of the right lower extremity, noted at the post tibial artery level (ABI 0.71) and mildly decreased perfusion of the left lower extremity,  noted at the post tibial artery level (ABI 0.80).  Compared to the study done on 08/17/2020, no change in right ABI, left ABI has improved from 0.57.  Left popliteal artery stenosis is new. Study suggests patent left iliac artery stent, restenosis in the right SFA and left popliteal artery from prior angioplasty.  Peripheral arteriogram 05/01/2021: Abdominal aorta reveals 2 renal arteries 1 on each side, widely patent.  Entire abdominal aorta has mild diffuse disease.  Aortoiliac bifurcation widely patent.  Left iliac artery and left common femoral artery show mild diffuse disease, previously placed stent in the left common Iliac artery are widely patent.  Left common femoral artery and profundofemoral artery are also widely patent.   Right iliac artery with distal runoff: Right common iliac artery is widely patent except a small segment of the distal vessel which is a 80% stenosis and the stenosis extends into the right proximal external iliac artery.  There was a 50 to 60 mmHg pressure gradient.  Calcification was circumferential and severe.  Right external iliac artery otherwise showed mild disease angiographically but mid to distal segment had a 50 to 60% gradient as well.  Right common femoral artery showed a calcific 80% stenosis, right profunda at the ostium has a 90% calcific stenosis.  Right SFA in the Hunter's canal is 100% occluded, calcific with extensive collaterals.  Right popliteal artery has a focal 80% to 90% stenosis followed by AT which is occluded there is chronic and two-vessel runoff in the form of peroneal and large PT.  Successful lithotripsy using 8.0 x 60 mm shockwave lithotripsy balloon with 0 mmHg pressure gradient across the right common femoral and right distal external iliac artery and the mid segment of the right external iliac artery was stented due to dissection with a 8.0 x 60 mm Innova self-expanding stent.  There was a very focal right common iliac artery dissection  that was left alone.  The brisk flow was evident throughout the right common, external, internal iliac artery and also right common femoral artery and SFA.  ABI 05/22/2021: This exam reveals moderately decreased perfusion of the right lower extremity, noted at the post tibial artery level (ABI 0.75).   This exam reveals moderately decreased perfusion of the left lower extremity, noted at the post tibial artery level (ABI 0.78).  Markedly abnormal monophasic and dampened waveform at the level of the ankles bilaterally. Compared to 11/17/2019, Right ABI 0.65 and left ABI 0.59.  ABI 11/27/2021: This exam reveals moderately decreased perfusion of the right lower extremity, noted at the dorsalis pedis and post tibial artery level (ABI 0.68).   This exam reveals moderately decreased perfusion of the left lower extremity, noted at the post tibial artery level (ABI 0.75).  Severely abnormal monophasic waveform at the level of the ankles.  Compared to 05/22/2021, there is improvement in bilateral ABI from right 0.65 and left 0.59.  EKG   EKG 12/07/2021: Sinus bradycardia rate of 53 bpm.  Normal axis.  No evidence of ischemia or underlying injury pattern.  Compared to previous EKG on 06/01/2021, heart  rate has decreased from 68 to 53 otherwise no significant changes.  Assessment     ICD-10-CM   1. Claudication in peripheral vascular disease (HCC)  I73.9     2. Polyneuropathy associated with underlying disease ([place])  G63     3. Peripheral neuropathy due to ischemia  G62.89     4. Essential hypertension, benign  I10 EKG 12-Lead      No orders of the defined types were placed in this encounter.  There are no discontinued medications.    Orders Placed This Encounter  Procedures   EKG 12-Lead    Recommendations:   Timothy Webster  is a 76 y.o.  AAM male  with PAD, hyperlipidemia, tobacco use disorder which he has quit in May 2017, PAD here for 102-month follow-up visit.  Patient has had left SFA  and popliteal artery angioplasty in 2017 and again in 2021 and left common iliac and external iliac artery self-expanding stents in 2017 and 2021.  Right SFA atherectomy followed by balloon angioplasty in 2021.  Claudication in peripheral vascular disease ([place]) Polyneuropathy associated with underlying disease ([place]) Peripheral neuropathy due to ischemia Patient underwent peripheral arteriogram and angioplasty on 05/01/2021 to heavily calcified and complex but successful angioplasty with EVL shockwave balloon for right external iliac and common femoral artery and stenting of the right external iliac artery with self-expanding stent.  Right SFA is occluded but has extensive collaterals and two-vessel runoff below the knee.  Since then he has noticed improvement in his right hip claudication.  He still has mild right calf and also left calf claudication but overall states that he is feeling remarkably well and has started to be active again. He continues aspirin and also cilostazol for claudication  Reviewed results of recent ABI that showed improvement in bilateral ABI from right 0.65 and left 0.59. Will repeat ABI in 1 year. Reviewed recent labs, lipids are under good control  Essential hypertension, benign Blood pressure is well controlled. No clinical evidence of heart failure on exam.  Follow-up in 6 months or sooner if needed.    Ernst Spell, [place] Office: [phone] Pager: [phone]

## 2022-01-05 DIAGNOSIS — Z87891 Personal history of nicotine dependence: Secondary | ICD-10-CM | POA: Diagnosis not present

## 2022-01-05 DIAGNOSIS — I209 Angina pectoris, unspecified: Secondary | ICD-10-CM | POA: Diagnosis not present

## 2022-01-05 DIAGNOSIS — I10 Essential (primary) hypertension: Secondary | ICD-10-CM | POA: Diagnosis not present

## 2022-01-29 DIAGNOSIS — I739 Peripheral vascular disease, unspecified: Secondary | ICD-10-CM | POA: Diagnosis not present

## 2022-01-29 DIAGNOSIS — R202 Paresthesia of skin: Secondary | ICD-10-CM | POA: Diagnosis not present

## 2022-01-29 DIAGNOSIS — Z6823 Body mass index (BMI) 23.0-23.9, adult: Secondary | ICD-10-CM | POA: Diagnosis not present

## 2022-01-29 DIAGNOSIS — I10 Essential (primary) hypertension: Secondary | ICD-10-CM | POA: Diagnosis not present

## 2022-01-29 DIAGNOSIS — I2510 Atherosclerotic heart disease of native coronary artery without angina pectoris: Secondary | ICD-10-CM | POA: Diagnosis not present

## 2022-01-29 DIAGNOSIS — I119 Hypertensive heart disease without heart failure: Secondary | ICD-10-CM | POA: Diagnosis not present

## 2022-01-30 DIAGNOSIS — H04129 Dry eye syndrome of unspecified lacrimal gland: Secondary | ICD-10-CM | POA: Diagnosis not present

## 2022-01-30 DIAGNOSIS — H401113 Primary open-angle glaucoma, right eye, severe stage: Secondary | ICD-10-CM | POA: Diagnosis not present

## 2022-01-30 DIAGNOSIS — H44522 Atrophy of globe, left eye: Secondary | ICD-10-CM | POA: Diagnosis not present

## 2022-02-14 ENCOUNTER — Other Ambulatory Visit: Payer: Self-pay

## 2022-02-14 DIAGNOSIS — I739 Peripheral vascular disease, unspecified: Secondary | ICD-10-CM

## 2022-03-07 ENCOUNTER — Encounter: Payer: Self-pay | Admitting: Radiology

## 2022-03-13 ENCOUNTER — Other Ambulatory Visit: Payer: Self-pay

## 2022-03-13 DIAGNOSIS — E7800 Pure hypercholesterolemia, unspecified: Secondary | ICD-10-CM

## 2022-03-16 ENCOUNTER — Other Ambulatory Visit: Payer: Self-pay

## 2022-03-16 DIAGNOSIS — I739 Peripheral vascular disease, unspecified: Secondary | ICD-10-CM

## 2022-03-21 DIAGNOSIS — M545 Low back pain, unspecified: Secondary | ICD-10-CM | POA: Diagnosis not present

## 2022-04-12 ENCOUNTER — Other Ambulatory Visit: Payer: Self-pay

## 2022-04-12 ENCOUNTER — Other Ambulatory Visit: Payer: Self-pay | Admitting: Cardiology

## 2022-04-12 DIAGNOSIS — I10 Essential (primary) hypertension: Secondary | ICD-10-CM

## 2022-04-23 DIAGNOSIS — I2510 Atherosclerotic heart disease of native coronary artery without angina pectoris: Secondary | ICD-10-CM | POA: Diagnosis not present

## 2022-04-23 DIAGNOSIS — G6289 Other specified polyneuropathies: Secondary | ICD-10-CM | POA: Diagnosis not present

## 2022-04-23 DIAGNOSIS — R202 Paresthesia of skin: Secondary | ICD-10-CM | POA: Diagnosis not present

## 2022-04-23 DIAGNOSIS — I10 Essential (primary) hypertension: Secondary | ICD-10-CM | POA: Diagnosis not present

## 2022-04-23 DIAGNOSIS — I739 Peripheral vascular disease, unspecified: Secondary | ICD-10-CM | POA: Diagnosis not present

## 2022-04-23 DIAGNOSIS — R001 Bradycardia, unspecified: Secondary | ICD-10-CM | POA: Diagnosis not present

## 2022-05-06 DIAGNOSIS — H35351 Cystoid macular degeneration, right eye: Secondary | ICD-10-CM | POA: Diagnosis not present

## 2022-05-06 DIAGNOSIS — H401132 Primary open-angle glaucoma, bilateral, moderate stage: Secondary | ICD-10-CM | POA: Diagnosis not present

## 2022-05-06 DIAGNOSIS — H44522 Atrophy of globe, left eye: Secondary | ICD-10-CM | POA: Diagnosis not present

## 2022-05-31 DIAGNOSIS — H04129 Dry eye syndrome of unspecified lacrimal gland: Secondary | ICD-10-CM | POA: Diagnosis not present

## 2022-05-31 DIAGNOSIS — H44129 Parasitic endophthalmitis, unspecified, unspecified eye: Secondary | ICD-10-CM | POA: Diagnosis not present

## 2022-05-31 DIAGNOSIS — H401113 Primary open-angle glaucoma, right eye, severe stage: Secondary | ICD-10-CM | POA: Diagnosis not present

## 2022-06-10 ENCOUNTER — Ambulatory Visit: Payer: Medicare Other | Admitting: Cardiology

## 2022-06-10 ENCOUNTER — Encounter: Payer: Self-pay | Admitting: Cardiology

## 2022-06-10 VITALS — BP 117/63 | HR 55 | Resp 16 | Ht 62.0 in | Wt 134.0 lb

## 2022-06-10 DIAGNOSIS — I739 Peripheral vascular disease, unspecified: Secondary | ICD-10-CM | POA: Diagnosis not present

## 2022-06-10 DIAGNOSIS — I10 Essential (primary) hypertension: Secondary | ICD-10-CM | POA: Diagnosis not present

## 2022-06-10 DIAGNOSIS — E7800 Pure hypercholesterolemia, unspecified: Secondary | ICD-10-CM | POA: Diagnosis not present

## 2022-06-10 NOTE — Progress Notes (Signed)
Primary Physician/Referring:  [name], MD  [name], male    DOB: 1945/09/30, 77 y.o.   MRN: [id number]  Chief Complaint  Patient presents with   Claudication in peripheral vascular disease   PAD   Hypertension   Follow-up    6 month   HPI:    Timothy Webster  is a 77 y.o. AAM male  with PAD, hyperlipidemia, tobacco use disorder which he has quit in May 2017, PAD here for 9-month follow-up visit.  Patient has had left SFA and popliteal artery angioplasty in 2017 and again in 2021 and left common iliac and external iliac artery self-expanding stents in 2017 and 2021.  Right SFA atherectomy followed by balloon angioplasty in 2021.  He was having severe symptoms of right hip claudication. Patient underwent peripheral arteriogram and complex angioplasty on 05/01/2021 to his right iliac and also common femoral artery.  Symptoms of right hip claudication has resolved.  He still has mild symptoms of claudication involving his bilateral calves.  Otherwise feeling well and states that he has not had any complications from the procedure.  Wife present.  No significant change in bilateral paresthesias of the lower extremity, denies chest pain or dyspnea.  Past Medical History:  Diagnosis Date   Glaucoma 02/25/2018   Hyperlipidemia    Hypertension    PAD (peripheral artery disease) (HCC)    Sinus bradycardia on ECG 02/25/2018   EKG 10/12/2015: Marked sinus bradycardia at rate of 44 bpm, normal axis. No evidence of ischemia otherwise normal EKG. No significant change from EKG 04/19/2015: Normal sinus rhythm at rate of 52 bpm, EKG 12/24/2013: Marked sinus bradycardia at the rate of 39 bpm.   Tobacco use disorder, severe, dependence 02/25/2018   Past Surgical History:  Procedure Laterality Date   INSERTION OF ILIAC STENT  03/15/2014   Procedure: INSERTION OF ILIAC STENT;  Surgeon: Yates Decamp, MD;  Location: [facility] CATH LAB;  Service: Cardiovascular;;  LEIA    LOWER EXTREMITY ANGIOGRAM  N/A 03/15/2014   Procedure: LOWER EXTREMITY ANGIOGRAM;  Surgeon: Yates Decamp, MD;  Location: [facility] CATH LAB;  Service: Cardiovascular;  Laterality: N/A;   LOWER EXTREMITY ANGIOGRAM Right 04/26/2014   Procedure: LOWER EXTREMITY ANGIOGRAM;  Surgeon: Yates Decamp, MD;  Location: [facility] CATH LAB;  Service: Cardiovascular;  Laterality: Right;   LOWER EXTREMITY ANGIOGRAPHY Bilateral 01/28/2017   Procedure: LOWER EXTREMITY ANGIOGRAPHY;  Surgeon: Yates Decamp, MD;  Location: [facility] INVASIVE CV LAB;  Service: Cardiovascular;  Laterality: Bilateral;   LOWER EXTREMITY ANGIOGRAPHY N/A 06/15/2019   Procedure: LOWER EXTREMITY ANGIOGRAPHY;  Surgeon: Yates Decamp, MD;  Location: [facility] INVASIVE CV LAB;  Service: Cardiovascular;  Laterality: N/A;   LOWER EXTREMITY ANGIOGRAPHY N/A 05/01/2021   Procedure: Lower Extremity Angiography;  Surgeon: Yates Decamp, MD;  Location: [facility] INVASIVE CV LAB;  Service: Cardiovascular;  Laterality: N/A;   PERIPHERAL VASCULAR ATHERECTOMY  07/20/2019   Procedure: PERIPHERAL VASCULAR ATHERECTOMY;  Surgeon: Yates Decamp, MD;  Location: [facility] INVASIVE CV LAB;  Service: Cardiovascular;;  Rt SFA   PERIPHERAL VASCULAR BALLOON ANGIOPLASTY  06/15/2019   Procedure: PERIPHERAL VASCULAR BALLOON ANGIOPLASTY;  Surgeon: Yates Decamp, MD;  Location: [facility] INVASIVE CV LAB;  Service: Cardiovascular;;  Left SFA   PERIPHERAL VASCULAR CATHETERIZATION Bilateral 05/16/2015   Procedure: Lower Extremity Angiography;  Surgeon: Yates Decamp, MD;  Location: [facility] INVASIVE CV LAB;  Service: Cardiovascular;  Laterality: Bilateral;   PERIPHERAL VASCULAR CATHETERIZATION Right 05/16/2015   Procedure: Peripheral Vascular Atherectomy;  Surgeon: Yates Decamp, MD;  Location: [facility] INVASIVE CV LAB;  Service: Cardiovascular;  Laterality: Right;  sfa   PERIPHERAL VASCULAR INTERVENTION  06/15/2019   Procedure: PERIPHERAL VASCULAR INTERVENTION;  Surgeon: Yates Decamp, MD;  Location: [facility] INVASIVE CV LAB;  Service: Cardiovascular;;  LEIA stent   PERIPHERAL VASCULAR INTERVENTION  05/01/2021    Procedure: PERIPHERAL VASCULAR INTERVENTION;  Surgeon: Yates Decamp, MD;  Location: [facility] INVASIVE CV LAB;  Service: Cardiovascular;;   REFRACTIVE SURGERY     Right Eye   Family History  Problem Relation Age of Onset   Hypertension Mother    Hyperlipidemia Mother    Heart disease Mother     Social History   Tobacco Use   Smoking status: Former    Packs/day: 1.00    Years: 15.00    Additional pack years: 0.00    Total pack years: 15.00    Types: Cigarettes    Quit date: 07/17/2015    Years since quitting: 6.9   Smokeless tobacco: Never  Substance Use Topics   Alcohol use: No   Marital Status: Married  ROS  Review of Systems  Cardiovascular:  Positive for claudication (stable). Negative for chest pain, dyspnea on exertion and leg swelling.  Gastrointestinal:  Negative for heartburn and melena.  Neurological:  Positive for paresthesias. Negative for dizziness.   Objective      06/10/2022    9:11 AM 12/07/2021   11:07 AM 06/01/2021    1:16 PM  Vitals with BMI  Height 5\' 2"  5' 2.5" 5\' 2"   Weight 134 lbs 125 lbs 10 oz 130 lbs  BMI 24.5 22.59 23.77  Systolic 117 120 161  Diastolic 63 62 61  Pulse 55 58 70    Physical Exam Neck:     Vascular: No carotid bruit or JVD.  Cardiovascular:     Rate and Rhythm: Normal rate and regular rhythm.     Pulses: Intact distal pulses.          Femoral pulses are 2+ on the right side with bruit and 2+ on the left side with bruit.      Dorsalis pedis pulses are 0 on the right side and 0 on the left side.       Posterior tibial pulses are 0 on the right side and 0 on the left side.     Heart sounds: Normal heart sounds. No murmur heard.    No gallop.  Pulmonary:     Effort: Pulmonary effort is normal.     Breath sounds: Normal breath sounds.  Abdominal:     General: Bowel sounds are normal.     Palpations: Abdomen is soft.  Musculoskeletal:     Right lower leg: No edema.     Left lower leg: No edema.     Laboratory examination:    Lab Results  Component Value Date   GLUCOSE 97 06/01/2021   NA 145 (H) 06/01/2021   K 4.0 06/01/2021   CL 107 (H) 06/01/2021   CO2 16 (L) 06/01/2021   BUN 15 06/01/2021   CREATININE 1.03 06/01/2021   EGFR 75 06/01/2021   CALCIUM 10.3 (H) 06/01/2021   PROT 8.3 06/01/2021   ALBUMIN 5.1 (H) 06/01/2021   LABGLOB 3.2 06/01/2021   AGRATIO 1.6 06/01/2021   BILITOT 0.3 06/01/2021   ALKPHOS 86 06/01/2021   AST 28 06/01/2021   ALT 17 06/01/2021   ANIONGAP 7 05/01/2021      Lab Results  Component Value Date   ALT 17 06/01/2021   AST  28 06/01/2021   ALKPHOS 86 06/01/2021   BILITOT 0.3 06/01/2021       Latest Ref Rng & Units 06/01/2021    2:40 PM 10/30/2018    9:58 AM 09/29/2017    8:16 PM  Hepatic Function  Total Protein 6.0 - 8.5 g/dL 8.3  7.1  7.1   Albumin 3.7 - 4.7 g/dL 5.1  4.2  4.0   AST 0 - 40 IU/L 28  18  22    ALT 0 - 44 IU/L 17  12  17    Alk Phosphatase 44 - 121 IU/L 86  72  49   Total Bilirubin 0.0 - 1.2 mg/dL 0.3  0.3  0.7     Lipid Panel Recent Labs    12/03/21 0916  CHOL 160  TRIG 59  LDLCALC 81  HDL 67   External labs:   Hemoglobin 12.300 G/ 12/03/2021 Platelets 217.000 x1 12/03/2021  Creatinine, Serum 1.140 mg/ 04/23/2022 CrCl Est 42.79 04/23/2022 eGFR 75.000 mL/min/1.73 06/01/2021 Potassium 4.200 mm 04/23/2022  ALT (SGPT) 19.000 U/L 04/23/2022  TSH 1.110 10/30/2018  Radiology:   Cardiac Studies:   Exercise Myoview stress test 03/23/2018: 1. The patient performed treadmill exercise using Bruce protocol, completing 5:50 minutes. The patient completed an estimated workload of 7 METS, reaching 83% of the maximum predicted heart rate. Exercise capacity was low. Hemodynamic response was normal. No stress symptoms reported. No ischemic changes seen on stress electrocardiogram.  Marginally sub-maximal stress test. 2.  The overall quality of the study is good. There is no evidence of abnormal lung activity. Stress and rest SPECT images demonstrate  homogeneous tracer distribution throughout the myocardium. Gated SPECT imaging reveals normal myocardial thickening and wall motion. The left ventricular ejection fraction was calculated 46%, although visually appears normal.    3. Low risk study. Marginally sub-maximal study. Clinical correlation recommended.   Peripheral arteriogram:  Left SFA and popliteal artery atherectomy and balloon angioplasty in 2017, focal restenosis angioplasty on 06/15/2019 in the mid segment of popliteal artery.  Iliac artery stenting at the same time.  Left common iliac and external iliac artery stenting with absolute Pro 9.0 x 60 mm followed by overlapping 9.0 x 16 and a 9.0 x 20 mm self-expanding stents on 06/15/2019  Right mid SFA and right popliteal artery atherectomy and drug-coated balloon angioplasty 07/20/2019  Lower Extremity Arterial Duplex 02/15/2021: Severely dampened monophasic flow in mid to distal SFA suggests subtotal occlusion. Right AT is occluded. Severely  dampened flow throughout the right lower extremity below the origin of SFA. There is severe diffuse calcific plaque throughout the right lower extremity.   Moderate velocity increase at the left profunda femoral artery and distal popliteal arteries suggests hemodynamically significant stenosis of >50%. There is severe diffuse calcific plaque throughout the left lower extremity.  This exam reveals moderately decreased perfusion of the right lower extremity, noted at the post tibial artery level (ABI 0.71) and mildly decreased perfusion of the left lower extremity, noted at the post tibial artery level (ABI 0.80).  Compared to the study done on 08/17/2020, no change in right ABI, left ABI has improved from 0.57.  Left popliteal artery stenosis is new. Study suggests patent left iliac artery stent, restenosis in the right SFA and left popliteal artery from prior angioplasty.  Peripheral arteriogram 05/01/2021:   Abdominal aorta reveals 2 renal arteries 1  on each side, widely patent.  Entire abdominal aorta has mild diffuse disease.  Aortoiliac bifurcation widely patent.  Left iliac artery and left  common femoral artery show mild diffuse disease, previously placed stent in the left common Iliac artery are widely patent.  Left common femoral artery and profundofemoral artery are also widely patent.   Right iliac artery with distal runoff: Right common iliac artery is widely patent except a small segment of the distal vessel which is a 80% stenosis and the stenosis extends into the right proximal external iliac artery.  There was a 50 to 60 mmHg pressure gradient.  Calcification was circumferential and severe.  Right external iliac artery otherwise showed mild disease angiographically but mid to distal segment had a 50 to 60% gradient as well.  Right common femoral artery showed a calcific 80% stenosis, right profunda at the ostium has a 90% calcific stenosis.  Right SFA in the Hunter's canal is 100% occluded, calcific with extensive collaterals.  Right popliteal artery has a focal 80% to 90% stenosis followed by AT which is occluded there is chronic and two-vessel runoff in the form of peroneal and large PT.  Successful lithotripsy using 8.0 x 60 mm shockwave lithotripsy balloon with 0 mmHg pressure gradient across the right common femoral and right distal external iliac artery and the mid segment of the right external iliac artery was stented due to dissection with a 8.0 x 60 mm Innova self-expanding stent.  There was a very focal right common iliac artery dissection that was left alone.  The brisk flow was evident throughout the right common, external, internal iliac artery and also right common femoral artery and SFA.  ABI 11/27/2021: This exam reveals moderately decreased perfusion of the right lower extremity, noted at the dorsalis pedis and post tibial artery level (ABI 0.68).   This exam reveals moderately decreased perfusion of the left lower  extremity, noted at the post tibial artery level (ABI 0.75).  Severely abnormal monophasic waveform at the level of the ankles.  Compared to 05/22/2021, there is improvement in bilateral ABI from right 0.65 and left 0.59.  EKG   EKG 06/10/2022: Marked sinus bradycardia.  46 bpm, leftward enlargement, otherwise normal EKG.  Compared to 12/07/2021, no change.  Medications and allergies  No Known Allergies   Medication list   Current Outpatient Medications:    acetaminophen (TYLENOL) 500 MG tablet, Take 1,000 mg by mouth every 6 (six) hours as needed (pain)., Disp: , Rfl:    amLODipine-benazepril (LOTREL) 10-40 MG capsule, Take 1 capsule by mouth once daily, Disp: 90 capsule, Rfl: 0   aspirin EC 81 MG tablet, Take 81 mg by mouth in the morning., Disp: , Rfl:    brimonidine (ALPHAGAN) 0.2 % ophthalmic solution, Place 1 drop into the right eye 2 (two) times daily., Disp: , Rfl:    cilostazol (PLETAL) 100 MG tablet, Take 1 tablet by mouth twice daily, Disp: 180 tablet, Rfl: 0   dorzolamide-timolol (COSOPT) 22.3-6.8 MG/ML ophthalmic solution, Place 1 drop into the right eye 2 (two) times daily., Disp: , Rfl:    ezetimibe (ZETIA) 10 MG tablet, Take 1 tablet by mouth once daily, Disp: 90 tablet, Rfl: 0   gabapentin (NEURONTIN) 300 MG capsule, TAKE 1 CAPSULE BY MOUTH IN THE MORNING , AT NOON AND AT BEDTIME TRY 3 TIMES A DAY TO SEE IF SYMPTOMS OF LEG PAIN IMPROVES, Disp: 180 capsule, Rfl: 0   latanoprost (XALATAN) 0.005 % ophthalmic solution, Place 1 drop into the right eye at bedtime., Disp: , Rfl:    Menthol-Methyl Salicylate (MUSCLE RUB) 10-15 % CREA, Apply 1 application. topically 4 (four) times  daily as needed for muscle pain., Disp: , Rfl:    nitroGLYCERIN (NITROSTAT) 0.4 MG SL tablet, DISSOLVE ONE TABLET UNDER THE TONGUE EVERY 5 MINUTES AS NEEDED FOR CHEST PAIN.  DO NOT EXCEED A TOTAL OF 3 DOSES IN 15 MINUTES, Disp: 25 tablet, Rfl: 0   rosuvastatin (CRESTOR) 20 MG tablet, Take 1 tablet by mouth once  daily, Disp: 90 tablet, Rfl: 0  Assessment     ICD-10-CM   1. Claudication in peripheral vascular disease (HCC)  I73.9 EKG 12-Lead    2. Pure hypercholesterolemia  E78.00     3. Primary hypertension  I10        Orders Placed This Encounter  Procedures   EKG 12-Lead    No orders of the defined types were placed in this encounter.   Medications Discontinued During This Encounter  Medication Reason   ketorolac (ACULAR) 0.5 % ophthalmic solution      Recommendations:   Timothy Webster is a 77 y.o.  African-American male patient with severe refractory disease, bilateral SFA angioplasty, bilateral iliac angioplasty, due to peripheral arteriogram on 05/01/2021, right SFA distal occlusion however underwent right right iliac angioplasty with resolution of hip claudication.  Past medical history significant for hypercholesterolemia, tobacco use disorder quit in 2017.  1. Claudication in peripheral vascular disease (HCC) Symptoms of claudication has remained stable, presently doing well, no significant change in physical exam although his pedal pulses could not be felt capillary refill is normal.  As long as his symptoms are stable, continue medical management.  He does have bilateral lower extremity paresthesias, medical therapy for this. - EKG 12-Lead  2. Pure hypercholesterolemia Lipids reviewed, lipids under excellent control.  3. Primary hypertension Blood pressure is well-controlled on present medical regimen, he is on amlodipine benazepril combination.  Overall stable from cardiac standpoint I will see him back in a year or sooner if problems.  He has asymptomatic sinus bradycardia.  He has remained abstinent from tobacco use.   Yates Decamp, MD, [facility] 06/10/2022, 9:36 AM Office: [phone]

## 2022-06-11 ENCOUNTER — Other Ambulatory Visit: Payer: Self-pay | Admitting: Cardiology

## 2022-06-11 DIAGNOSIS — E7800 Pure hypercholesterolemia, unspecified: Secondary | ICD-10-CM

## 2022-07-09 ENCOUNTER — Other Ambulatory Visit: Payer: Self-pay | Admitting: Cardiology

## 2022-07-09 DIAGNOSIS — I10 Essential (primary) hypertension: Secondary | ICD-10-CM

## 2022-08-12 DIAGNOSIS — I2510 Atherosclerotic heart disease of native coronary artery without angina pectoris: Secondary | ICD-10-CM | POA: Diagnosis not present

## 2022-08-12 DIAGNOSIS — G6289 Other specified polyneuropathies: Secondary | ICD-10-CM | POA: Diagnosis not present

## 2022-08-12 DIAGNOSIS — R001 Bradycardia, unspecified: Secondary | ICD-10-CM | POA: Diagnosis not present

## 2022-08-12 DIAGNOSIS — I10 Essential (primary) hypertension: Secondary | ICD-10-CM | POA: Diagnosis not present

## 2022-08-12 DIAGNOSIS — I739 Peripheral vascular disease, unspecified: Secondary | ICD-10-CM | POA: Diagnosis not present

## 2022-08-12 DIAGNOSIS — Z0001 Encounter for general adult medical examination with abnormal findings: Secondary | ICD-10-CM | POA: Diagnosis not present

## 2022-08-12 DIAGNOSIS — R202 Paresthesia of skin: Secondary | ICD-10-CM | POA: Diagnosis not present

## 2022-08-12 DIAGNOSIS — M545 Low back pain, unspecified: Secondary | ICD-10-CM | POA: Diagnosis not present

## 2022-09-11 NOTE — Telephone Encounter (Signed)
This encounter was created in error - please disregard.

## 2022-09-17 ENCOUNTER — Other Ambulatory Visit: Payer: Self-pay | Admitting: Cardiology

## 2022-09-27 DIAGNOSIS — H04129 Dry eye syndrome of unspecified lacrimal gland: Secondary | ICD-10-CM | POA: Diagnosis not present

## 2022-09-27 DIAGNOSIS — H401113 Primary open-angle glaucoma, right eye, severe stage: Secondary | ICD-10-CM | POA: Diagnosis not present

## 2022-09-27 DIAGNOSIS — H44522 Atrophy of globe, left eye: Secondary | ICD-10-CM | POA: Diagnosis not present

## 2022-10-21 ENCOUNTER — Other Ambulatory Visit: Payer: Self-pay | Admitting: Cardiology

## 2022-10-21 DIAGNOSIS — I10 Essential (primary) hypertension: Secondary | ICD-10-CM

## 2022-10-23 ENCOUNTER — Telehealth: Payer: Self-pay | Admitting: Cardiology

## 2022-10-23 ENCOUNTER — Other Ambulatory Visit: Payer: Self-pay

## 2022-10-23 DIAGNOSIS — I10 Essential (primary) hypertension: Secondary | ICD-10-CM

## 2022-10-23 MED ORDER — AMLODIPINE BESY-BENAZEPRIL HCL 10-40 MG PO CAPS: 1.0000 | ORAL_CAPSULE | Freq: Every day | ORAL | 2 refills | Status: DC

## 2022-10-23 NOTE — Telephone Encounter (Signed)
*  STAT* If patient is at the pharmacy, call can be transferred to refill team.   1. Which medications need to be refilled? (please list name of each medication and dose if known)   amLODipine-benazepril (LOTREL) 10-40 MG capsule    2. Which pharmacy/location (including street and city if local pharmacy) is medication to be sent to? [id number] - [place], [place] - [address]    3. Do they need a 30 day or 90 day supply? 90 day

## 2022-11-22 DIAGNOSIS — Z23 Encounter for immunization: Secondary | ICD-10-CM | POA: Diagnosis not present

## 2022-12-10 ENCOUNTER — Other Ambulatory Visit: Payer: Self-pay | Admitting: Cardiology

## 2022-12-17 ENCOUNTER — Other Ambulatory Visit: Payer: Self-pay | Admitting: Cardiology

## 2022-12-17 DIAGNOSIS — I739 Peripheral vascular disease, unspecified: Secondary | ICD-10-CM

## 2023-01-17 DIAGNOSIS — H44522 Atrophy of globe, left eye: Secondary | ICD-10-CM | POA: Diagnosis not present

## 2023-01-17 DIAGNOSIS — H04129 Dry eye syndrome of unspecified lacrimal gland: Secondary | ICD-10-CM | POA: Diagnosis not present

## 2023-01-17 DIAGNOSIS — H401113 Primary open-angle glaucoma, right eye, severe stage: Secondary | ICD-10-CM | POA: Diagnosis not present

## 2023-02-06 IMAGING — DX DG SHOULDER 2+V*R*
3 series · 3 of 3 positions shown · non-contrast
Comparison: CT chest 05/22/2020.

CLINICAL DATA: History of right shoulder pain.  No known injury.

EXAM:
RIGHT SHOULDER - 2+ VIEW

[shoulder y view]
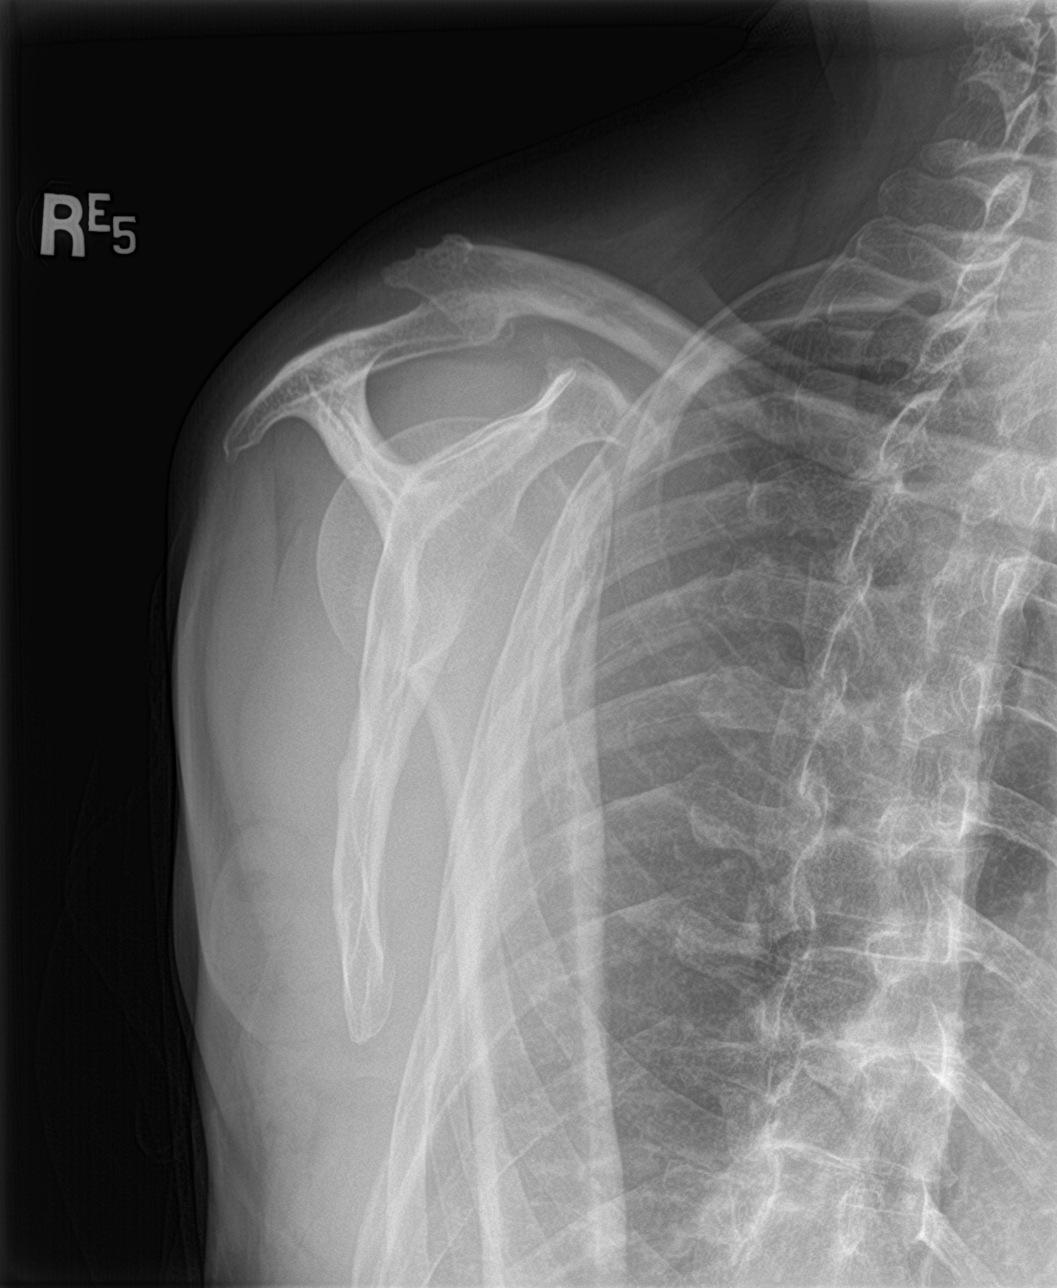

[shoulder axillary]
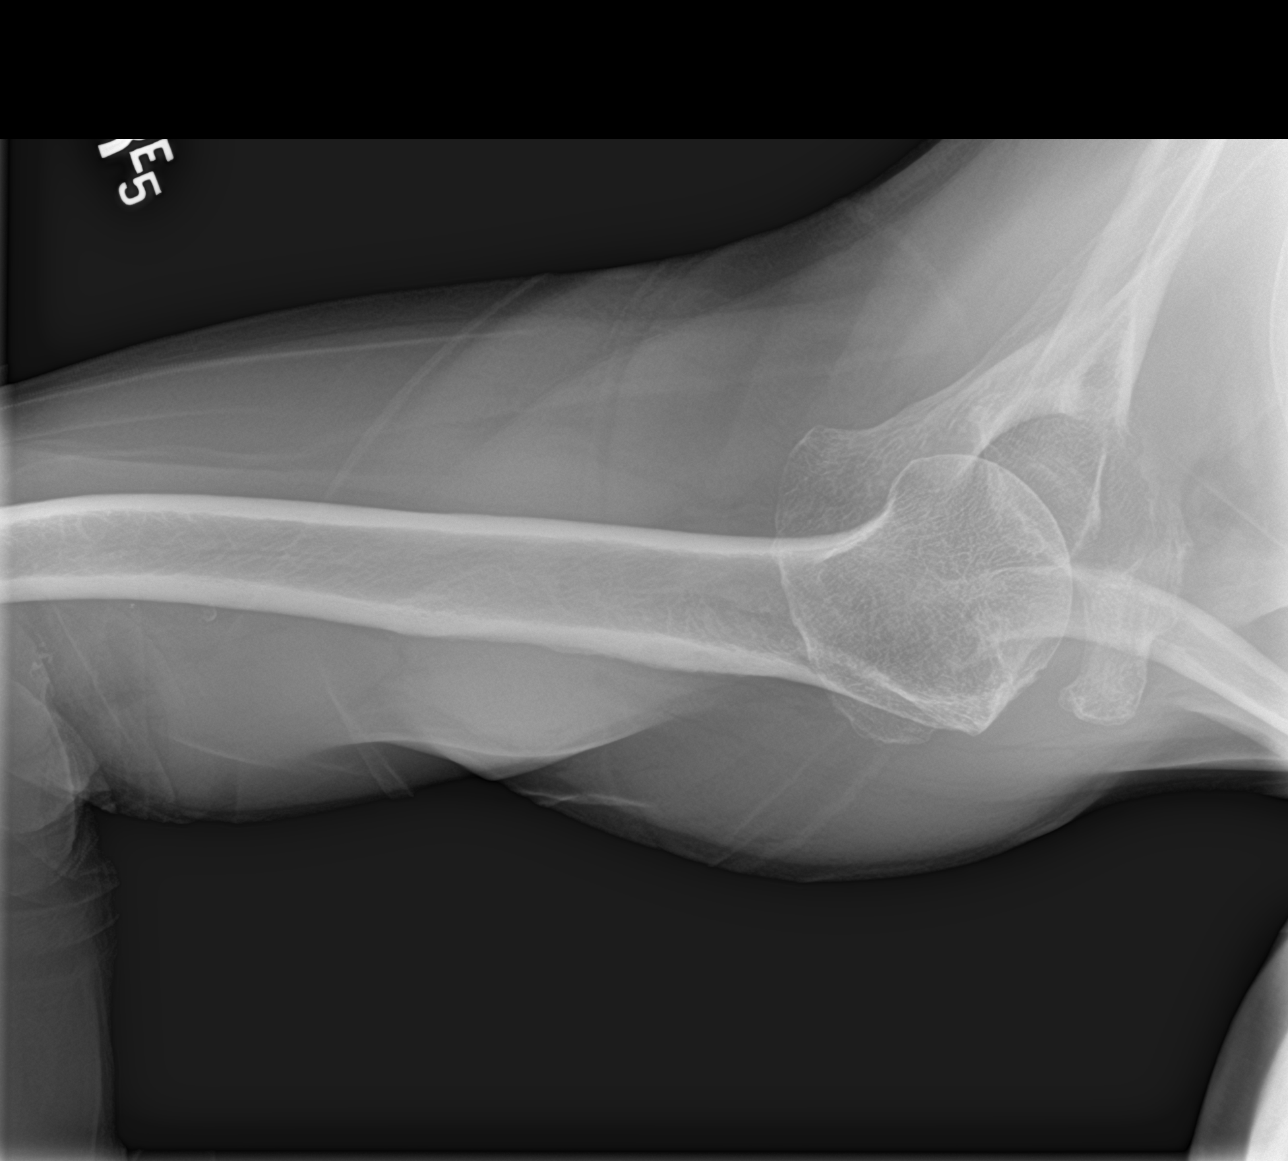

[shoulder grashey]
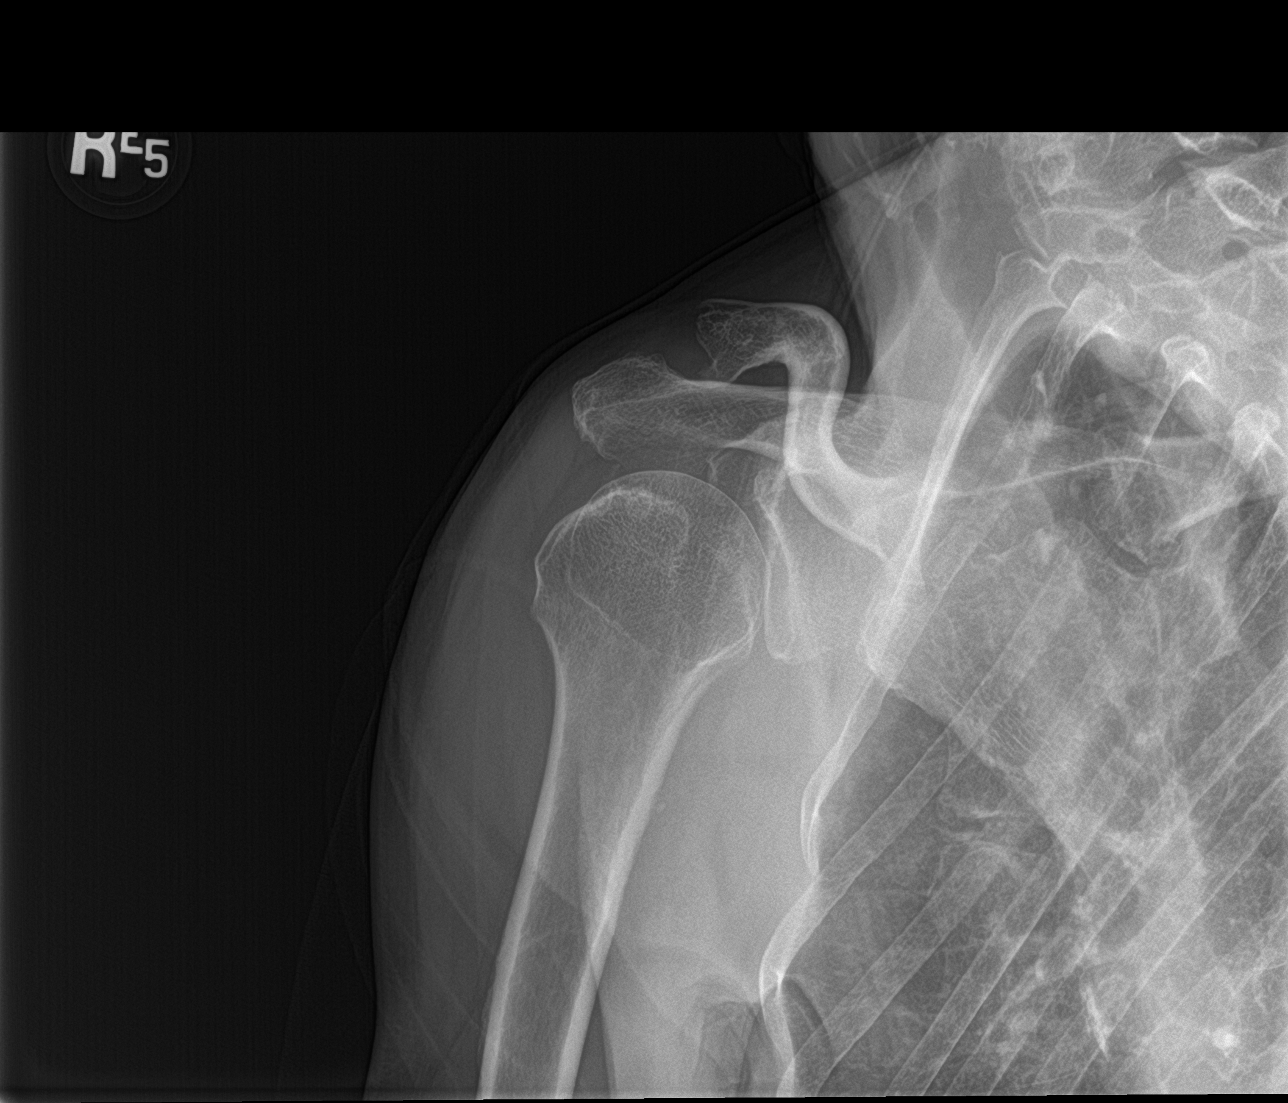

[3 of 3 positions shown; findings below may reference images not displayed]

FINDINGS: Acromioclavicular and glenohumeral degenerative change. Faint
calcification noted over the supraspinatus tendon. Calcific
supraspinatus tendinosis cannot be excluded. No acute bony
abnormality. No evidence of fracture or dislocation. No evidence of
separation.
IMPRESSION: Acromioclavicular and glenohumeral degenerative change. Faint
calcification noted over the supraspinatus tendon. Calcific
supraspinatus tendinitis cannot be excluded. No acute abnormality
identified.

## 2023-02-06 IMAGING — DX DG CHEST 2V
2 series · 2 of 2 positions shown · non-contrast
Comparison: CT 05/22/2020.  Chest x-ray 02/17/2016.

CLINICAL DATA: Chest pain.

EXAM:
CHEST - 2 VIEW

[chest pa]
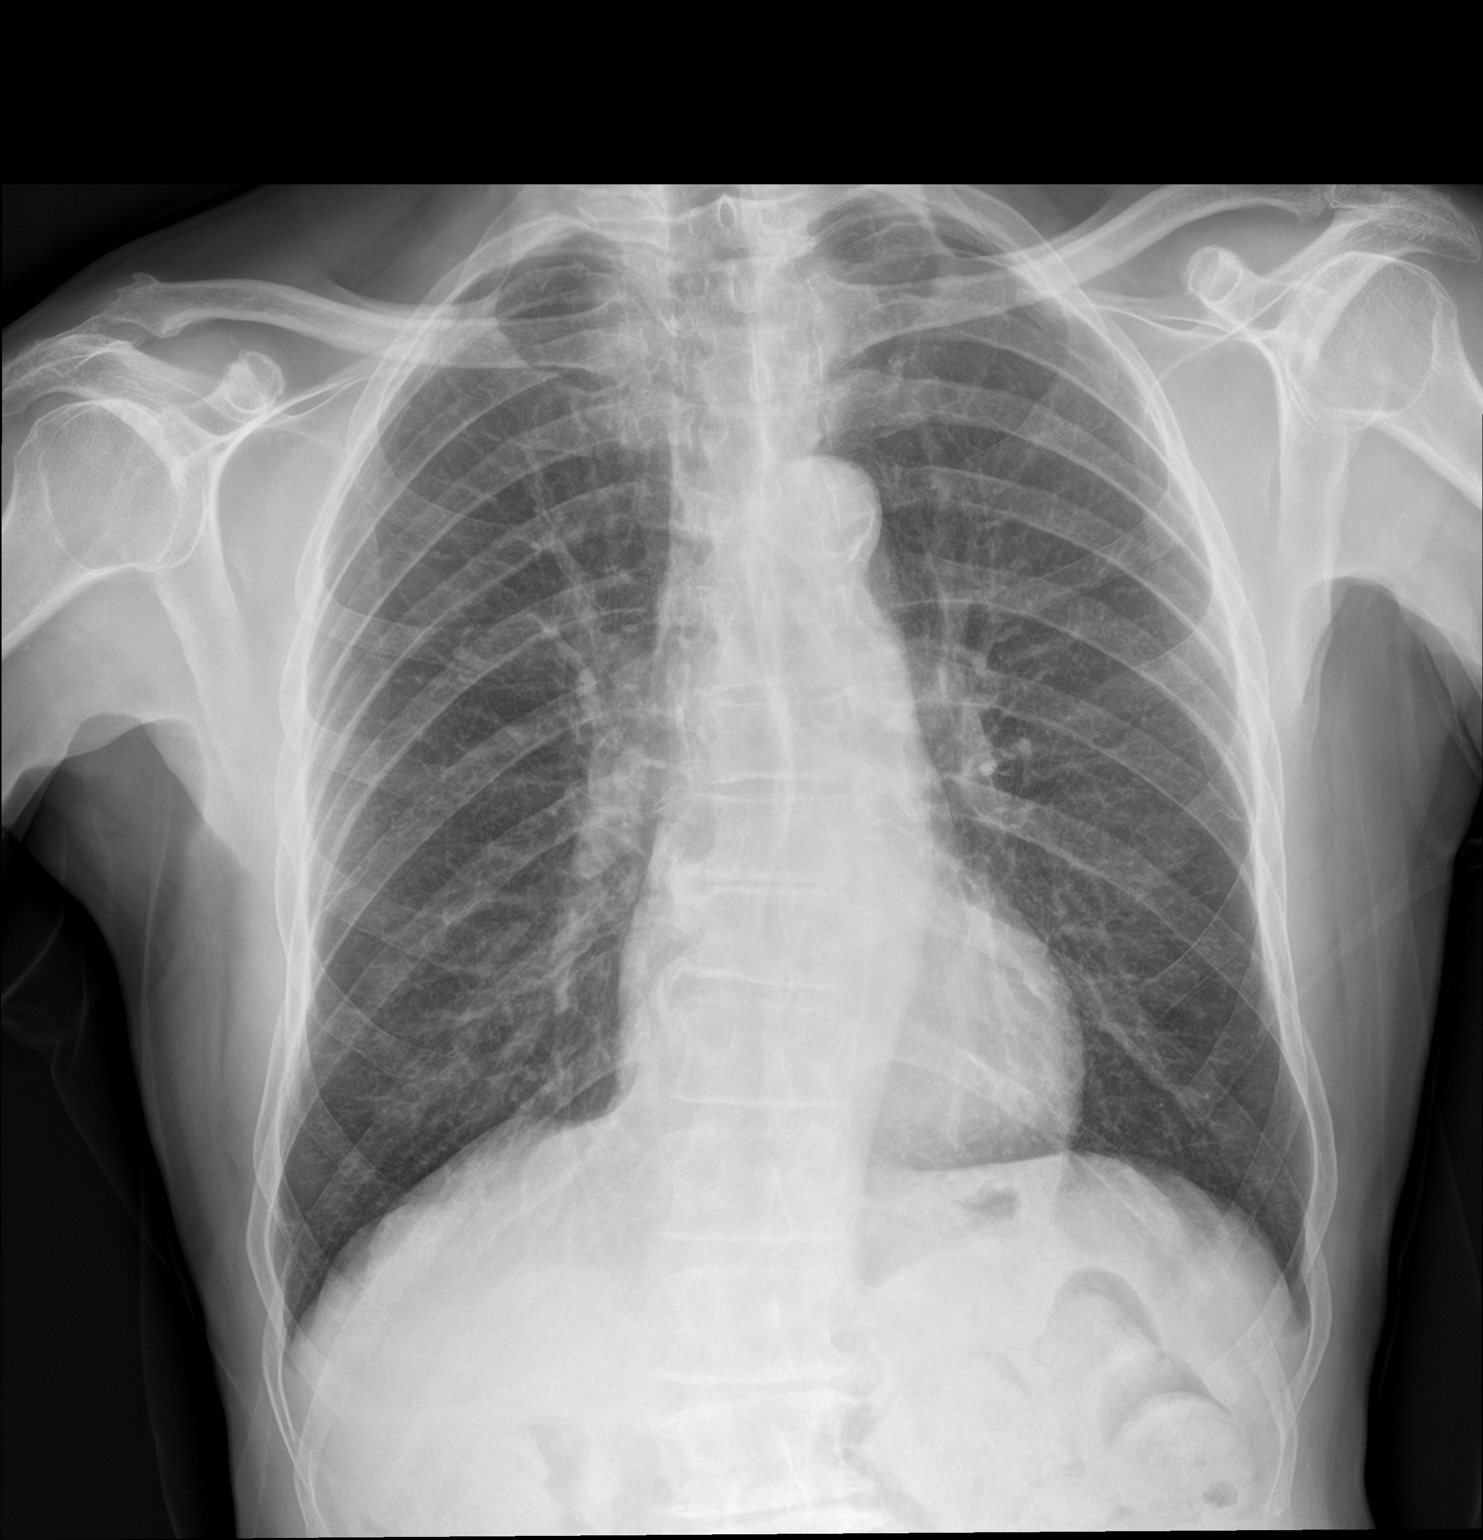

[chest lat]
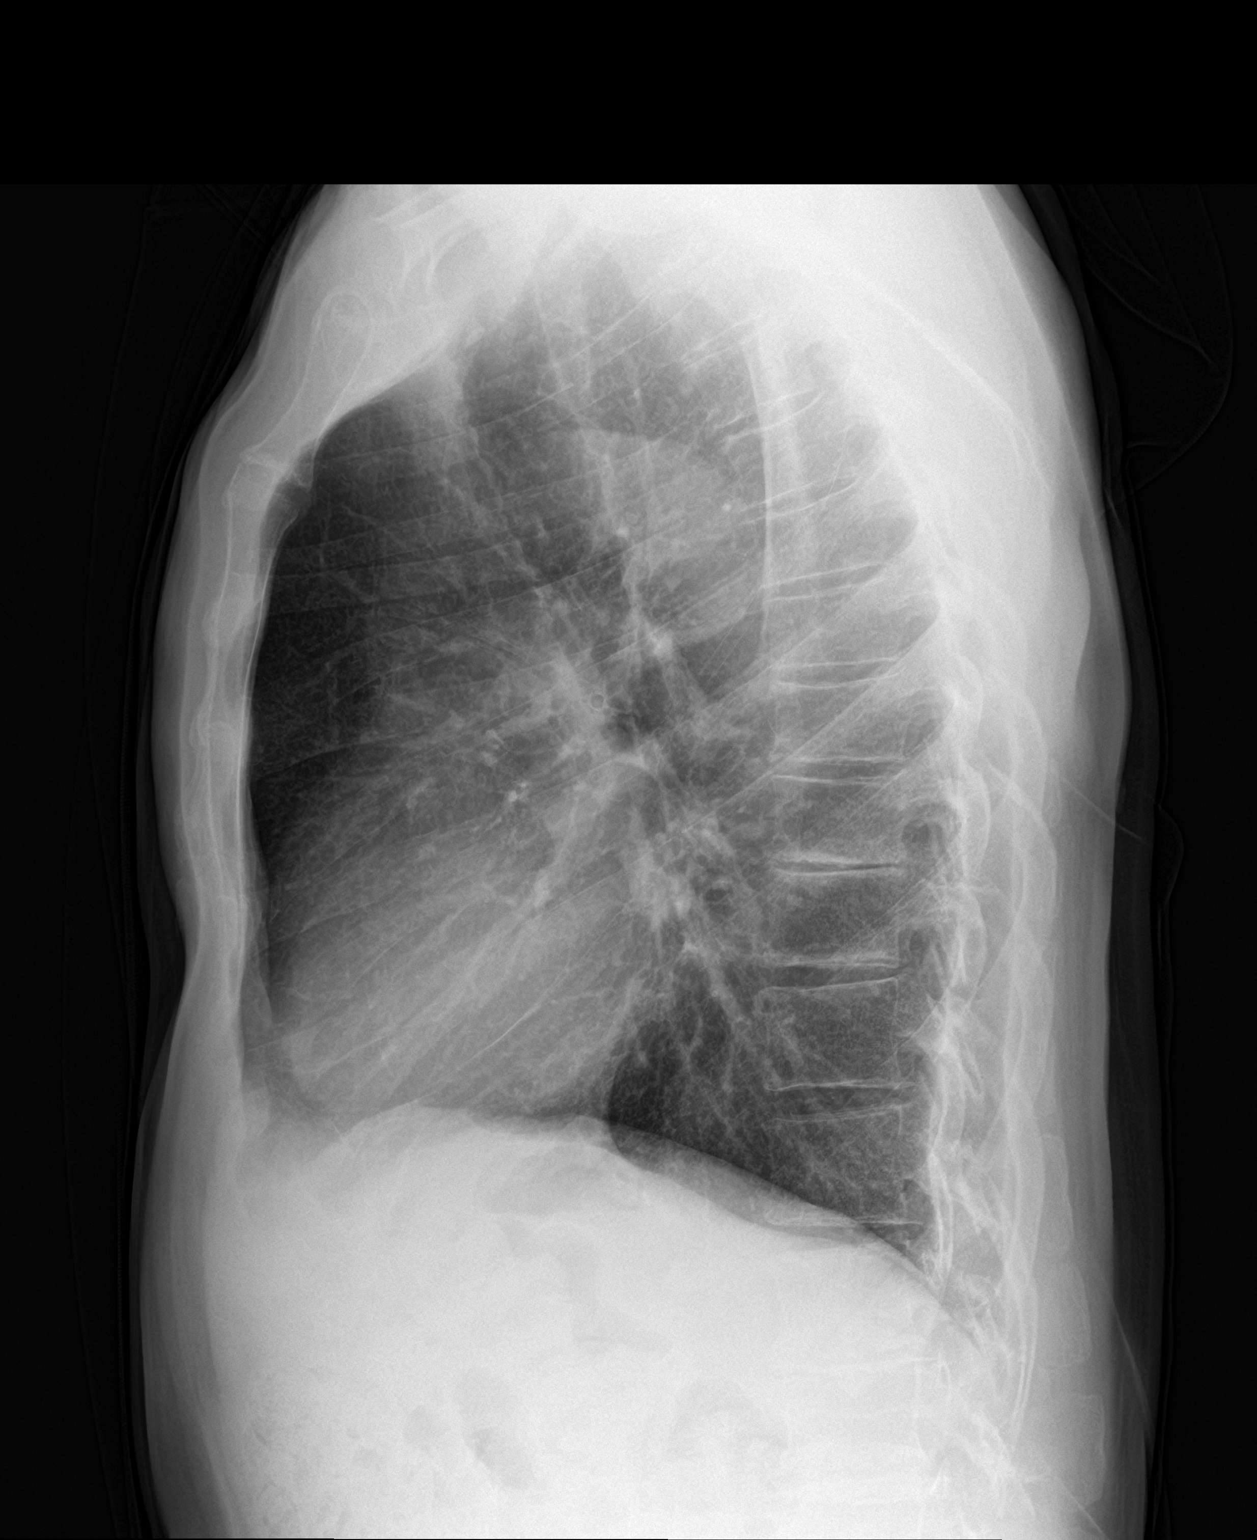

[2 of 2 positions shown; findings below may reference images not displayed]

FINDINGS: Mediastinum and hilar structures normal. Heart size normal. Lungs
are clear. No pleural effusion or pneumothorax. Degenerative changes
scoliosis thoracic spine. Degenerative changes both shoulders.
IMPRESSION: No acute cardiopulmonary disease.

## 2023-02-14 ENCOUNTER — Other Ambulatory Visit: Payer: Self-pay

## 2023-02-14 ENCOUNTER — Emergency Department (HOSPITAL_COMMUNITY): Payer: Medicare Other

## 2023-02-14 ENCOUNTER — Inpatient Hospital Stay (HOSPITAL_COMMUNITY)
Admission: EM | Admit: 2023-02-14 | Discharge: 2023-02-16 | DRG: 193 | Disposition: A | Discharge status: Home / Self Care | Payer: Medicare Other | Attending: Internal Medicine | Admitting: Internal Medicine

## 2023-02-14 DIAGNOSIS — E782 Mixed hyperlipidemia: Secondary | ICD-10-CM | POA: Diagnosis not present

## 2023-02-14 DIAGNOSIS — G9341 Metabolic encephalopathy: Secondary | ICD-10-CM | POA: Insufficient documentation

## 2023-02-14 DIAGNOSIS — E860 Dehydration: Secondary | ICD-10-CM | POA: Insufficient documentation

## 2023-02-14 DIAGNOSIS — R001 Bradycardia, unspecified: Secondary | ICD-10-CM | POA: Diagnosis not present

## 2023-02-14 DIAGNOSIS — J9601 Acute respiratory failure with hypoxia: Secondary | ICD-10-CM | POA: Diagnosis not present

## 2023-02-14 DIAGNOSIS — Z743 Need for continuous supervision: Secondary | ICD-10-CM | POA: Diagnosis not present

## 2023-02-14 DIAGNOSIS — Z7902 Long term (current) use of antithrombotics/antiplatelets: Secondary | ICD-10-CM | POA: Diagnosis not present

## 2023-02-14 DIAGNOSIS — G9340 Encephalopathy, unspecified: Secondary | ICD-10-CM

## 2023-02-14 DIAGNOSIS — Z87891 Personal history of nicotine dependence: Secondary | ICD-10-CM

## 2023-02-14 DIAGNOSIS — Z79899 Other long term (current) drug therapy: Secondary | ICD-10-CM

## 2023-02-14 DIAGNOSIS — I739 Peripheral vascular disease, unspecified: Secondary | ICD-10-CM | POA: Diagnosis present

## 2023-02-14 DIAGNOSIS — Z0389 Encounter for observation for other suspected diseases and conditions ruled out: Secondary | ICD-10-CM | POA: Diagnosis not present

## 2023-02-14 DIAGNOSIS — N179 Acute kidney failure, unspecified: Secondary | ICD-10-CM | POA: Diagnosis not present

## 2023-02-14 DIAGNOSIS — J101 Influenza due to other identified influenza virus with other respiratory manifestations: Secondary | ICD-10-CM | POA: Diagnosis not present

## 2023-02-14 DIAGNOSIS — I10 Essential (primary) hypertension: Secondary | ICD-10-CM | POA: Diagnosis present

## 2023-02-14 DIAGNOSIS — R6889 Other general symptoms and signs: Secondary | ICD-10-CM | POA: Diagnosis not present

## 2023-02-14 DIAGNOSIS — J441 Chronic obstructive pulmonary disease with (acute) exacerbation: Secondary | ICD-10-CM | POA: Diagnosis not present

## 2023-02-14 DIAGNOSIS — Z1152 Encounter for screening for COVID-19: Secondary | ICD-10-CM

## 2023-02-14 DIAGNOSIS — J111 Influenza due to unidentified influenza virus with other respiratory manifestations: Principal | ICD-10-CM

## 2023-02-14 DIAGNOSIS — Z8249 Family history of ischemic heart disease and other diseases of the circulatory system: Secondary | ICD-10-CM | POA: Diagnosis not present

## 2023-02-14 DIAGNOSIS — R350 Frequency of micturition: Secondary | ICD-10-CM | POA: Diagnosis present

## 2023-02-14 DIAGNOSIS — Z83438 Family history of other disorder of lipoprotein metabolism and other lipidemia: Secondary | ICD-10-CM | POA: Diagnosis not present

## 2023-02-14 DIAGNOSIS — I499 Cardiac arrhythmia, unspecified: Secondary | ICD-10-CM | POA: Diagnosis not present

## 2023-02-14 DIAGNOSIS — R509 Fever, unspecified: Secondary | ICD-10-CM | POA: Diagnosis not present

## 2023-02-14 LAB — CBC WITH DIFFERENTIAL/PLATELET
Abs Immature Granulocytes: 0.02 10*3/uL (ref 0.00–0.07)
Basophils Absolute: 0 10*3/uL (ref 0.0–0.1)
Basophils Relative: 1 %
Eosinophils Absolute: 0 10*3/uL (ref 0.0–0.5)
Eosinophils Relative: 0 %
HCT: 33.3 % — ABNORMAL LOW (ref 39.0–52.0)
Hemoglobin: 10.9 g/dL — ABNORMAL LOW (ref 13.0–17.0)
Immature Granulocytes: 0 %
Lymphocytes Relative: 3 %
Lymphs Abs: 0.2 10*3/uL — ABNORMAL LOW (ref 0.7–4.0)
MCH: 31.8 pg (ref 26.0–34.0)
MCHC: 32.7 g/dL (ref 30.0–36.0)
MCV: 97.1 fL (ref 80.0–100.0)
Monocytes Absolute: 0.5 10*3/uL (ref 0.1–1.0)
Monocytes Relative: 6 %
Neutro Abs: 7 10*3/uL (ref 1.7–7.7)
Neutrophils Relative %: 90 %
Platelets: 157 10*3/uL (ref 150–400)
RBC: 3.43 MIL/uL — ABNORMAL LOW (ref 4.22–5.81)
RDW: 14.5 % (ref 11.5–15.5)
WBC: 7.7 10*3/uL (ref 4.0–10.5)
nRBC: 0 % (ref 0.0–0.2)

## 2023-02-14 LAB — RESP PANEL BY RT-PCR (RSV, FLU A&B, COVID)  RVPGX2
Influenza A by PCR: POSITIVE — AB
Influenza B by PCR: NEGATIVE
Resp Syncytial Virus by PCR: NEGATIVE
SARS Coronavirus 2 by RT PCR: NEGATIVE

## 2023-02-14 LAB — COMPREHENSIVE METABOLIC PANEL
ALT: 17 U/L (ref 0–44)
AST: 26 U/L (ref 15–41)
Albumin: 3.9 g/dL (ref 3.5–5.0)
Alkaline Phosphatase: 41 U/L (ref 38–126)
Anion gap: 9 (ref 5–15)
BUN: 15 mg/dL (ref 8–23)
CO2: 19 mmol/L — ABNORMAL LOW (ref 22–32)
Calcium: 9.1 mg/dL (ref 8.9–10.3)
Chloride: 108 mmol/L (ref 98–111)
Creatinine, Ser: 1.4 mg/dL — ABNORMAL HIGH (ref 0.61–1.24)
GFR, Estimated: 51 mL/min — ABNORMAL LOW (ref >60)
Glucose, Bld: 109 mg/dL — ABNORMAL HIGH (ref 70–99)
Potassium: 3.6 mmol/L (ref 3.5–5.1)
Sodium: 136 mmol/L (ref 135–145)
Total Bilirubin: 0.7 mg/dL (ref 0.0–1.2)
Total Protein: 7.1 g/dL (ref 6.5–8.1)

## 2023-02-14 LAB — URINALYSIS, W/ REFLEX TO CULTURE (INFECTION SUSPECTED)
Bacteria, UA: NONE SEEN
Bilirubin Urine: NEGATIVE
Glucose, UA: NEGATIVE mg/dL
Hgb urine dipstick: NEGATIVE
Ketones, ur: NEGATIVE mg/dL
Leukocytes,Ua: NEGATIVE
Nitrite: NEGATIVE
Protein, ur: NEGATIVE mg/dL
Specific Gravity, Urine: 1.017 (ref 1.005–1.030)
pH: 5 (ref 5.0–8.0)

## 2023-02-14 LAB — LACTIC ACID, PLASMA
Lactic Acid, Venous: 1.4 mmol/L (ref 0.5–1.9)
Lactic Acid, Venous: 0.9 mmol/L (ref 0.5–1.9)

## 2023-02-14 MED ORDER — LACTATED RINGERS IV SOLN: INTRAVENOUS | Status: DC

## 2023-02-14 MED ORDER — LACTATED RINGERS IV BOLUS
1000.0000 mL | Freq: Once | INTRAVENOUS | Status: AC
  Administered 2023-02-14: 1000 mL via INTRAVENOUS

## 2023-02-14 NOTE — Consult Note (Signed)
 CODE SEPSIS - PHARMACY COMMUNICATION  **Broad Spectrum Antibiotics should be administered within 1 hour of Sepsis diagnosis**  Time Code Sepsis Called/Page Received: 1835  Antibiotics Ordered: none *Patient received Rocephin IM per EMS prior to admission. Per MD communication at 1903, order for Abx yet until further assessment  If necessary, Name of Provider/Nurse Contacted: Dr Patsey Schneiders Rodriguez-Guzman PharmD, BCPS 02/14/2023 7:06 PM

## 2023-02-14 NOTE — Sepsis Progress Note (Signed)
 eLink is following this Code Sepsis.

## 2023-02-14 NOTE — ED Notes (Signed)
 Gave pt urinal, told pt to call when he was done to collect.

## 2023-02-14 NOTE — Sepsis Progress Note (Signed)
 Elink monitoring for the code sepsis protocol.

## 2023-02-14 NOTE — ED Triage Notes (Signed)
 Per family, patient is altered. Woke up later than normal. Speech off. Bizarre behavior at home that is not his baseline. Not recognizing people at home. 102.4 Temp with EMS. Given 1000mg  tylenol  PO and 1g rochephin IM by EMS. A+Ox3 with ems and in triage, disoriented to date. Patient calm, cooperative and pleasant. Patient denies any pains, urinary complaints. Patient states he is here for his eye causing him issues, abnormal appearance noted to left eye, reported to be baseline.

## 2023-02-14 NOTE — ED Provider Notes (Signed)
 [place] EMERGENCY DEPARTMENT AT [facility] Provider Note   CSN: [id number] Arrival date & time: 02/14/23  1807     History  Chief Complaint  Patient presents with   Altered Mental Status    Per family, patient is altered. Woke up later than normal. Speech off. Bizarre behavior at home that is not his baseline. Not recognizing people at home. 102.4 Temp with EMS. Given 1000mg  tylenol  PO and 1g rochephin IM by EMS. A+Ox3 with ems and in triage, disoriented to date. Patient calm, cooperative and pleasant. Patient denies any pains, urinary complaints. Patient states he is here for his eye causing him issues, abnormal appearance noted to left eye, reported to be baseline.      Timothy Webster is a 78 y.o. male.   Altered Mental Status Patient brought in for mental status change.  Reportedly confused and not recognizing people.  Temperature 102.4 for EMS.  Patient is awake and pleasant.  States he does feel little bad.  States he has had some urinary frequency which is not unusual.  Denies cough.  States his wife has been sick with a cold.  Does have some confusion.     Home Medications Prior to Admission medications   Medication Sig Start Date End Date Taking? Authorizing Provider  acetaminophen  (TYLENOL ) 500 MG tablet Take 1,000 mg by mouth every 6 (six) hours as needed (pain).    [provider]  amLODipine -benazepril  (LOTREL) 10-40 MG capsule Take 1 capsule by mouth daily. 10/23/22   Ladona Heinz, MD  aspirin  EC 81 MG tablet Take 81 mg by mouth in the morning.    [provider]  brimonidine  (ALPHAGAN ) 0.2 % ophthalmic solution Place 1 drop into the right eye 2 (two) times daily. 02/26/19   [provider]  cilostazol  (PLETAL ) 100 MG tablet Take 1 tablet by mouth twice daily 12/18/22   [name], Jay, MD  dorzolamide -timolol  (COSOPT ) 22.3-6.8 MG/ML ophthalmic solution Place 1 drop into the right eye 2 (two) times daily.    [provider]   ezetimibe  (ZETIA ) 10 MG tablet Take 1 tablet by mouth once daily 12/13/22   Ladona Heinz, MD  gabapentin  (NEURONTIN ) 300 MG capsule TAKE 1 CAPSULE BY MOUTH IN THE MORNING , AT NOON AND AT BEDTIME TRY 3 TIMES A DAY TO SEE IF SYMPTOMS OF LEG PAIN IMPROVES 08/30/21   Ladona Heinz, MD  latanoprost  (XALATAN ) 0.005 % ophthalmic solution Place 1 drop into the right eye at bedtime.    [provider]  Menthol-Methyl Salicylate (MUSCLE RUB) 10-15 % CREA Apply 1 application. topically 4 (four) times daily as needed for muscle pain.    [provider]  nitroGLYCERIN  (NITROSTAT ) 0.4 MG SL tablet DISSOLVE ONE TABLET UNDER THE TONGUE EVERY 5 MINUTES AS NEEDED FOR CHEST PAIN.  DO NOT EXCEED A TOTAL OF 3 DOSES IN 15 MINUTES 10/18/20   Ladona Heinz, MD  rosuvastatin  (CRESTOR ) 20 MG tablet Take 1 tablet by mouth once daily 06/11/22   [name], Jay, MD      Allergies    Patient has no known allergies.    Review of Systems   Review of Systems  Physical Exam Updated Vital Signs BP (!) 117/54   Pulse 71   Temp 100.1 F (37.8 C)   Resp 14   SpO2 93%  Physical Exam Vitals reviewed.  Eyes:     Comments: Chronic changes of left eye.  Cardiovascular:     Rate and Rhythm: Regular rhythm.  Abdominal:     Tenderness: There is abdominal tenderness.     Comments: Mild lower abdominal tenderness without rebound or guarding.  No hernia palpated.  Musculoskeletal:     Cervical back: Neck supple.  Skin:    Capillary Refill: Capillary refill takes less than 2 seconds.  Neurological:     Mental Status: He is alert.     Comments: Patient awake and pleasant, but some apparent confusion.     ED Results / Procedures / Treatments   Labs (all labs ordered are listed, but only abnormal results are displayed) Labs Reviewed  RESP PANEL BY RT-PCR (RSV, FLU A&B, COVID)  RVPGX2 - Abnormal; Notable for the following components:      Result Value   Influenza A by PCR POSITIVE (*)    All other components within  normal limits  COMPREHENSIVE METABOLIC PANEL - Abnormal; Notable for the following components:   CO2 19 (*)    Glucose, Bld 109 (*)    Creatinine, Ser 1.40 (*)    GFR, Estimated 51 (*)    All other components within normal limits  CBC WITH DIFFERENTIAL/PLATELET - Abnormal; Notable for the following components:   RBC 3.43 (*)    Hemoglobin 10.9 (*)    HCT 33.3 (*)    Lymphs Abs 0.2 (*)    All other components within normal limits  CULTURE, BLOOD (ROUTINE X 2)  CULTURE, BLOOD (ROUTINE X 2)  RESP PANEL BY RT-PCR (RSV, FLU A&B, COVID)  RVPGX2  LACTIC ACID, PLASMA  LACTIC ACID, PLASMA  URINALYSIS, W/ REFLEX TO CULTURE (INFECTION SUSPECTED)    EKG None  Radiology DG Chest Port 1 View Result Date: 02/14/2023 CLINICAL DATA:  Questionable sepsis EXAM: PORTABLE CHEST 1 VIEW COMPARISON:  Chest x-ray 03/30/2021 FINDINGS: The heart size and mediastinal contours are within normal limits. Both lungs are clear. The visualized skeletal structures are unremarkable. IMPRESSION: No active disease. Electronically Signed   By: Greig Pique M.D.   On: 02/14/2023 19:11    Procedures Procedures    Medications Ordered in ED Medications  lactated ringers  infusion ( Intravenous New Bag/Given 02/14/23 2129)  lactated ringers  bolus 1,000 mL (0 mLs Intravenous Stopped 02/14/23 2127)    ED Course/ Medical Decision Making/ A&P                                 Medical Decision Making Amount and/or Complexity of Data Reviewed Labs: ordered. Radiology: ordered.  Risk Prescription drug management.   Patient brought in with mental status change and fever.  Patient states he has some dysuria.  Also flu is very prevalent in the community at this time.  He stated his wife had a cold.  Reportedly received Rocephin and Tylenol  by EMS.  Differential diagnosis does include various infections.  Will get chest x-ray urinalysis basic blood work including lactic acid and blood cultures.  Will also check  urinalysis. CBC does show mild anemia.  Lactic acid normal.  Urine also does not show infection however respiratory panel does show influenza.  Still confusion.  Mild hypoxia time.  I feel patient benefit from admission to the hospital.  Will discuss with hospitalist.          Final Clinical Impression(s) / ED Diagnoses Final diagnoses:  Influenza  Encephalopathy, unspecified type    Rx / DC Orders ED Discharge Orders     None  Patsey Lot, MD 02/14/23 [phone]

## 2023-02-14 NOTE — ED Notes (Signed)
 ED Provider at bedside.

## 2023-02-15 ENCOUNTER — Encounter (HOSPITAL_COMMUNITY): Payer: Self-pay | Admitting: Internal Medicine

## 2023-02-15 DIAGNOSIS — N179 Acute kidney failure, unspecified: Secondary | ICD-10-CM | POA: Diagnosis not present

## 2023-02-15 DIAGNOSIS — J101 Influenza due to other identified influenza virus with other respiratory manifestations: Secondary | ICD-10-CM | POA: Diagnosis present

## 2023-02-15 DIAGNOSIS — G9341 Metabolic encephalopathy: Secondary | ICD-10-CM | POA: Diagnosis not present

## 2023-02-15 DIAGNOSIS — Z87891 Personal history of nicotine dependence: Secondary | ICD-10-CM | POA: Diagnosis not present

## 2023-02-15 DIAGNOSIS — J441 Chronic obstructive pulmonary disease with (acute) exacerbation: Secondary | ICD-10-CM | POA: Diagnosis present

## 2023-02-15 DIAGNOSIS — Z79899 Other long term (current) drug therapy: Secondary | ICD-10-CM | POA: Diagnosis not present

## 2023-02-15 DIAGNOSIS — I10 Essential (primary) hypertension: Secondary | ICD-10-CM | POA: Diagnosis not present

## 2023-02-15 DIAGNOSIS — Z83438 Family history of other disorder of lipoprotein metabolism and other lipidemia: Secondary | ICD-10-CM | POA: Diagnosis not present

## 2023-02-15 DIAGNOSIS — Z1152 Encounter for screening for COVID-19: Secondary | ICD-10-CM | POA: Diagnosis not present

## 2023-02-15 DIAGNOSIS — E860 Dehydration: Secondary | ICD-10-CM | POA: Diagnosis not present

## 2023-02-15 DIAGNOSIS — I739 Peripheral vascular disease, unspecified: Secondary | ICD-10-CM | POA: Diagnosis present

## 2023-02-15 DIAGNOSIS — J9601 Acute respiratory failure with hypoxia: Secondary | ICD-10-CM | POA: Diagnosis not present

## 2023-02-15 DIAGNOSIS — E782 Mixed hyperlipidemia: Secondary | ICD-10-CM | POA: Diagnosis not present

## 2023-02-15 DIAGNOSIS — Z7902 Long term (current) use of antithrombotics/antiplatelets: Secondary | ICD-10-CM | POA: Diagnosis not present

## 2023-02-15 DIAGNOSIS — Z8249 Family history of ischemic heart disease and other diseases of the circulatory system: Secondary | ICD-10-CM | POA: Diagnosis not present

## 2023-02-15 DIAGNOSIS — R350 Frequency of micturition: Secondary | ICD-10-CM | POA: Diagnosis present

## 2023-02-15 LAB — CBG MONITORING, ED: Glucose-Capillary: 93 mg/dL (ref 70–99)

## 2023-02-15 LAB — COMPREHENSIVE METABOLIC PANEL
ALT: 17 U/L (ref 0–44)
AST: 32 U/L (ref 15–41)
Albumin: 3.5 g/dL (ref 3.5–5.0)
Alkaline Phosphatase: 36 U/L — ABNORMAL LOW (ref 38–126)
Anion gap: 9 (ref 5–15)
BUN: 15 mg/dL (ref 8–23)
CO2: 20 mmol/L — ABNORMAL LOW (ref 22–32)
Calcium: 8.8 mg/dL — ABNORMAL LOW (ref 8.9–10.3)
Chloride: 108 mmol/L (ref 98–111)
Creatinine, Ser: 1.3 mg/dL — ABNORMAL HIGH (ref 0.61–1.24)
GFR, Estimated: 56 mL/min — ABNORMAL LOW (ref >60)
Glucose, Bld: 105 mg/dL — ABNORMAL HIGH (ref 70–99)
Potassium: 3.6 mmol/L (ref 3.5–5.1)
Sodium: 137 mmol/L (ref 135–145)
Total Bilirubin: 0.7 mg/dL (ref 0.0–1.2)
Total Protein: 6.4 g/dL — ABNORMAL LOW (ref 6.5–8.1)

## 2023-02-15 LAB — BLOOD GAS, VENOUS
Acid-base deficit: 1.1 mmol/L (ref 0.0–2.0)
Bicarbonate: 22.6 mmol/L (ref 20.0–28.0)
Drawn by: 27160
O2 Saturation: 93.6 %
Patient temperature: 37.5
pCO2, Ven: 35 mm[Hg] — ABNORMAL LOW (ref 44–60)
pH, Ven: 7.42 (ref 7.25–7.43)
pO2, Ven: 60 mm[Hg] — ABNORMAL HIGH (ref 32–45)

## 2023-02-15 LAB — CBC
HCT: 32.8 % — ABNORMAL LOW (ref 39.0–52.0)
Hemoglobin: 10.8 g/dL — ABNORMAL LOW (ref 13.0–17.0)
MCH: 32 pg (ref 26.0–34.0)
MCHC: 32.9 g/dL (ref 30.0–36.0)
MCV: 97 fL (ref 80.0–100.0)
Platelets: 147 10*3/uL — ABNORMAL LOW (ref 150–400)
RBC: 3.38 MIL/uL — ABNORMAL LOW (ref 4.22–5.81)
RDW: 14.5 % (ref 11.5–15.5)
WBC: 6.4 10*3/uL (ref 4.0–10.5)
nRBC: 0 % (ref 0.0–0.2)

## 2023-02-15 LAB — TSH: TSH: 0.217 u[IU]/mL — ABNORMAL LOW (ref 0.350–4.500)

## 2023-02-15 LAB — PROCALCITONIN: Procalcitonin: 0.2 ng/mL

## 2023-02-15 LAB — PHOSPHORUS: Phosphorus: 2 mg/dL — ABNORMAL LOW (ref 2.5–4.6)

## 2023-02-15 LAB — MAGNESIUM: Magnesium: 1.6 mg/dL — ABNORMAL LOW (ref 1.7–2.4)

## 2023-02-15 LAB — VITAMIN B12: Vitamin B-12: 201 pg/mL (ref 180–914)

## 2023-02-15 LAB — T4, FREE: Free T4: 0.99 ng/dL (ref 0.61–1.12)

## 2023-02-15 LAB — FOLATE: Folate: 12 ng/mL (ref >5.9)

## 2023-02-15 MED ORDER — BENAZEPRIL HCL 20 MG PO TABS: 40.0000 mg | ORAL_TABLET | Freq: Every day | ORAL | Status: DC

## 2023-02-15 MED ORDER — ROSUVASTATIN CALCIUM 20 MG PO TABS
20.0000 mg | ORAL_TABLET | Freq: Every day | ORAL | Status: DC
  Administered 2023-02-15 – 2023-02-16 (×2): 20 mg via ORAL
  Filled 2023-02-15 (×2): qty 1

## 2023-02-15 MED ORDER — METHYLPREDNISOLONE SODIUM SUCC 40 MG IJ SOLR
40.0000 mg | Freq: Two times a day (BID) | INTRAMUSCULAR | Status: DC
  Administered 2023-02-15 – 2023-02-16 (×3): 40 mg via INTRAVENOUS
  Filled 2023-02-15 (×3): qty 1

## 2023-02-15 MED ORDER — LACTATED RINGERS IV SOLN: INTRAVENOUS | Status: AC

## 2023-02-15 MED ORDER — HEPARIN SODIUM (PORCINE) 5000 UNIT/ML IJ SOLN
5000.0000 [IU] | Freq: Three times a day (TID) | INTRAMUSCULAR | Status: DC
  Administered 2023-02-15 – 2023-02-16 (×4): 5000 [IU] via SUBCUTANEOUS
  Filled 2023-02-15 (×4): qty 1

## 2023-02-15 MED ORDER — ACETAMINOPHEN 325 MG PO TABS
650.0000 mg | ORAL_TABLET | Freq: Four times a day (QID) | ORAL | Status: DC | PRN
  Administered 2023-02-15: 650 mg via ORAL
  Filled 2023-02-15: qty 2

## 2023-02-15 MED ORDER — AMLODIPINE BESYLATE 5 MG PO TABS: 10.0000 mg | ORAL_TABLET | Freq: Every day | ORAL | Status: DC

## 2023-02-15 MED ORDER — BUDESONIDE 0.5 MG/2ML IN SUSP
0.5000 mg | Freq: Two times a day (BID) | RESPIRATORY_TRACT | Status: DC
  Administered 2023-02-15 – 2023-02-16 (×3): 0.5 mg via RESPIRATORY_TRACT
  Filled 2023-02-15 (×3): qty 2

## 2023-02-15 MED ORDER — ARFORMOTEROL TARTRATE 15 MCG/2ML IN NEBU
15.0000 ug | INHALATION_SOLUTION | Freq: Two times a day (BID) | RESPIRATORY_TRACT | Status: DC
  Administered 2023-02-15 – 2023-02-16 (×3): 15 ug via RESPIRATORY_TRACT
  Filled 2023-02-15 (×3): qty 2

## 2023-02-15 MED ORDER — ENSURE ENLIVE PO LIQD
237.0000 mL | Freq: Two times a day (BID) | ORAL | Status: DC
  Administered 2023-02-15 – 2023-02-16 (×2): 237 mL via ORAL

## 2023-02-15 MED ORDER — AMLODIPINE BESYLATE 5 MG PO TABS
5.0000 mg | ORAL_TABLET | Freq: Every day | ORAL | Status: DC
  Administered 2023-02-15 – 2023-02-16 (×2): 5 mg via ORAL
  Filled 2023-02-15 (×2): qty 1

## 2023-02-15 MED ORDER — EZETIMIBE 10 MG PO TABS
10.0000 mg | ORAL_TABLET | Freq: Every day | ORAL | Status: DC
  Administered 2023-02-15 – 2023-02-16 (×2): 10 mg via ORAL
  Filled 2023-02-15 (×2): qty 1

## 2023-02-15 MED ORDER — VITAMIN B-12 100 MCG PO TABS
500.0000 ug | ORAL_TABLET | Freq: Every day | ORAL | Status: DC
  Administered 2023-02-16: 500 ug via ORAL
  Filled 2023-02-15: qty 5

## 2023-02-15 MED ORDER — CYANOCOBALAMIN 1000 MCG/ML IJ SOLN
1000.0000 ug | Freq: Once | INTRAMUSCULAR | Status: AC
  Administered 2023-02-15: 1000 ug via INTRAMUSCULAR
  Filled 2023-02-15: qty 1

## 2023-02-15 MED ORDER — ACETAMINOPHEN 650 MG RE SUPP: 650.0000 mg | Freq: Four times a day (QID) | RECTAL | Status: DC | PRN

## 2023-02-15 MED ORDER — K PHOS MONO-SOD PHOS DI & MONO 155-852-130 MG PO TABS
500.0000 mg | ORAL_TABLET | Freq: Two times a day (BID) | ORAL | Status: DC
  Administered 2023-02-15 – 2023-02-16 (×3): 500 mg via ORAL
  Filled 2023-02-15 (×6): qty 2

## 2023-02-15 MED ORDER — ONDANSETRON HCL 4 MG PO TABS: 4.0000 mg | ORAL_TABLET | Freq: Four times a day (QID) | ORAL | Status: DC | PRN

## 2023-02-15 MED ORDER — MAGNESIUM SULFATE 2 GM/50ML IV SOLN
2.0000 g | Freq: Once | INTRAVENOUS | Status: AC
  Administered 2023-02-15: 2 g via INTRAVENOUS
  Filled 2023-02-15: qty 50

## 2023-02-15 MED ORDER — IPRATROPIUM-ALBUTEROL 0.5-2.5 (3) MG/3ML IN SOLN
3.0000 mL | Freq: Four times a day (QID) | RESPIRATORY_TRACT | Status: DC
  Administered 2023-02-15 (×3): 3 mL via RESPIRATORY_TRACT
  Filled 2023-02-15 (×3): qty 3

## 2023-02-15 MED ORDER — ONDANSETRON HCL 4 MG/2ML IJ SOLN
4.0000 mg | Freq: Four times a day (QID) | INTRAMUSCULAR | Status: DC | PRN
Start: 2023-02-15 — End: 2023-02-16

## 2023-02-15 MED ORDER — OSELTAMIVIR PHOSPHATE 75 MG PO CAPS
75.0000 mg | ORAL_CAPSULE | Freq: Two times a day (BID) | ORAL | Status: DC
  Administered 2023-02-15 (×2): 75 mg via ORAL
  Filled 2023-02-15 (×3): qty 1

## 2023-02-15 NOTE — ED Notes (Signed)
 Walked into pts room to find him out of bed trying to find a location to urinate. This RN educated the pt on the safety of using the call bed and not getting out of bed with out help due to being hooked to the monitor and IV fluids. Pt endorsed he understood to use the call bed when he needed help. With teach back method when I handed him the call bell he showed me the button he would push to get help.

## 2023-02-15 NOTE — ED Notes (Signed)
 ED TO INPATIENT HANDOFF REPORT  ED Nurse Name and Phone #: [phone]  S Name/Age/Gender Timothy Webster 78 y.o. male Room/Bed: APA18/APA18  Code Status   Code Status: Full Code  Home/SNF/Other Home Patient oriented to: self, place, time, and situation Is this baseline? Yes   Triage Complete: Triage complete  Chief Complaint Influenza A [J10.1]  Triage Note Per family, patient is altered. Woke up later than normal. Speech off. Bizarre behavior at home that is not his baseline. Not recognizing people at home. 102.4 Temp with EMS. Given 1000mg  tylenol  PO and 1g rochephin IM by EMS. A+Ox3 with ems and in triage, disoriented to date. Patient calm, cooperative and pleasant. Patient denies any pains, urinary complaints. Patient states he is here for his eye causing him issues, abnormal appearance noted to left eye, reported to be baseline.   Allergies No Known Allergies  Level of Care/Admitting Diagnosis ED Disposition     ED Disposition  Admit   Condition  --   Comment  Hospital Area: [facility] [[id number]]  Level of Care: Med-Surg [16]  Covid Evaluation: Asymptomatic - no recent exposure (last 10 days) testing not required  Diagnosis: Influenza A [723124]  Admitting Physician: ADEFESO, OLADAPO [[id number]]  Attending Physician: ADEFESO, OLADAPO [[id number]]  Certification:: I certify this patient will need inpatient services for at least 2 midnights  Expected Medical Readiness: 02/18/2023          B Medical/Surgery History Past Medical History:  Diagnosis Date   Glaucoma 02/25/2018   Hyperlipidemia    Hypertension    PAD (peripheral artery disease) (HCC)    Sinus bradycardia on ECG 02/25/2018   EKG 10/12/2015: Marked sinus bradycardia at rate of 44 bpm, normal axis. No evidence of ischemia otherwise normal EKG. No significant change from EKG 04/19/2015: Normal sinus rhythm at rate of 52 bpm, EKG 12/24/2013: Marked sinus bradycardia at the rate of 39 bpm.   Tobacco use  disorder, severe, dependence 02/25/2018   Past Surgical History:  Procedure Laterality Date   INSERTION OF ILIAC STENT  03/15/2014   Procedure: INSERTION OF ILIAC STENT;  Surgeon: Gordy Bergamo, MD;  Location: [facility] CATH LAB;  Service: Cardiovascular;;  LEIA    LOWER EXTREMITY ANGIOGRAM N/A 03/15/2014   Procedure: LOWER EXTREMITY ANGIOGRAM;  Surgeon: Gordy Bergamo, MD;  Location: [facility] CATH LAB;  Service: Cardiovascular;  Laterality: N/A;   LOWER EXTREMITY ANGIOGRAM Right 04/26/2014   Procedure: LOWER EXTREMITY ANGIOGRAM;  Surgeon: Gordy Bergamo, MD;  Location: [facility] CATH LAB;  Service: Cardiovascular;  Laterality: Right;   LOWER EXTREMITY ANGIOGRAPHY Bilateral 01/28/2017   Procedure: LOWER EXTREMITY ANGIOGRAPHY;  Surgeon: Bergamo Gordy, MD;  Location: [facility] INVASIVE CV LAB;  Service: Cardiovascular;  Laterality: Bilateral;   LOWER EXTREMITY ANGIOGRAPHY N/A 06/15/2019   Procedure: LOWER EXTREMITY ANGIOGRAPHY;  Surgeon: Bergamo Gordy, MD;  Location: [facility] INVASIVE CV LAB;  Service: Cardiovascular;  Laterality: N/A;   LOWER EXTREMITY ANGIOGRAPHY N/A 05/01/2021   Procedure: Lower Extremity Angiography;  Surgeon: Bergamo Gordy, MD;  Location: [facility] INVASIVE CV LAB;  Service: Cardiovascular;  Laterality: N/A;   PERIPHERAL VASCULAR ATHERECTOMY  07/20/2019   Procedure: PERIPHERAL VASCULAR ATHERECTOMY;  Surgeon: Bergamo Gordy, MD;  Location: [facility] INVASIVE CV LAB;  Service: Cardiovascular;;  Rt SFA   PERIPHERAL VASCULAR BALLOON ANGIOPLASTY  06/15/2019   Procedure: PERIPHERAL VASCULAR BALLOON ANGIOPLASTY;  Surgeon: Bergamo Gordy, MD;  Location: [facility] INVASIVE CV LAB;  Service: Cardiovascular;;  Left SFA   PERIPHERAL VASCULAR CATHETERIZATION Bilateral 05/16/2015   Procedure: Lower Extremity Angiography;  Surgeon: Gordy Bergamo, MD;  Location: [facility] INVASIVE CV LAB;  Service: Cardiovascular;  Laterality: Bilateral;   PERIPHERAL VASCULAR CATHETERIZATION Right 05/16/2015   Procedure: Peripheral Vascular Atherectomy;  Surgeon: Gordy Bergamo, MD;  Location: [facility] INVASIVE CV LAB;   Service: Cardiovascular;  Laterality: Right;  sfa   PERIPHERAL VASCULAR INTERVENTION  06/15/2019   Procedure: PERIPHERAL VASCULAR INTERVENTION;  Surgeon: Bergamo Gordy, MD;  Location: [facility] INVASIVE CV LAB;  Service: Cardiovascular;;  LEIA stent   PERIPHERAL VASCULAR INTERVENTION  05/01/2021   Procedure: PERIPHERAL VASCULAR INTERVENTION;  Surgeon: Bergamo Gordy, MD;  Location: [facility] INVASIVE CV LAB;  Service: Cardiovascular;;   REFRACTIVE SURGERY     Right Eye     A IV Location/Drains/Wounds Patient Lines/Drains/Airways Status     Active Line/Drains/Airways     Name Placement date Placement time Site Days   Peripheral IV 02/14/23 20 G Left Antecubital 02/14/23  1818  Antecubital  1   Peripheral IV 02/14/23 20 G Posterior;Right Forearm 02/14/23  1857  Forearm  1            Intake/Output Last 24 hours  Intake/Output Summary (Last 24 hours) at 02/15/2023 1252 Last data filed at 02/15/2023 0430 Gross per 24 hour  Intake 1000 ml  Output 150 ml  Net 850 ml    Labs/Imaging Results for orders placed or performed during the hospital encounter of 02/14/23 (from the past 48 hours)  Resp panel by RT-PCR (RSV, Flu A&B, Covid) Anterior Nasal Swab     Status: Abnormal   Collection Time: 02/14/23  6:35 PM   Specimen: Anterior Nasal Swab  Result Value Ref Range   SARS Coronavirus 2 by RT PCR NEGATIVE NEGATIVE    Comment: (NOTE) SARS-CoV-2 target nucleic acids are NOT DETECTED.  The SARS-CoV-2 RNA is generally detectable in upper respiratory specimens during the acute phase of infection. The lowest concentration of SARS-CoV-2 viral copies this assay can detect is 138 copies/mL. A negative result does not preclude SARS-Cov-2 infection and should not be used as the sole basis for treatment or other patient management decisions. A negative result may occur with  improper specimen collection/handling, submission of specimen other than nasopharyngeal swab, presence of viral mutation(s) within the areas  targeted by this assay, and inadequate number of viral copies(<138 copies/mL). A negative result must be combined with clinical observations, patient history, and epidemiological information. The expected result is Negative.  Fact Sheet for Patients:  bloggercourse.com  Fact Sheet for Healthcare Providers:  seriousbroker.it  This test is no t yet approved or cleared by the [place]  FDA and  has been authorized for detection and/or diagnosis of SARS-CoV-2 by FDA under an Emergency Use Authorization (EUA). This EUA will remain  in effect (meaning this test can be used) for the duration of the COVID-19 declaration under Section 564(b)(1) of the Act, 21 U.S.C.section 360bbb-3(b)(1), unless the authorization is terminated  or revoked sooner.       Influenza A by PCR POSITIVE (A) NEGATIVE   Influenza B by PCR NEGATIVE NEGATIVE    Comment: (NOTE) The Xpert Xpress SARS-CoV-2/FLU/RSV plus assay is intended as an aid in the diagnosis of influenza from Nasopharyngeal swab specimens and should not be used as a sole basis for treatment. Nasal washings and aspirates are unacceptable for Xpert Xpress SARS-CoV-2/FLU/RSV testing.  Fact Sheet for Patients: bloggercourse.com  Fact Sheet for Healthcare Providers: seriousbroker.it  This test is not yet approved or cleared by the [place]  FDA and has been authorized for detection and/or  diagnosis of SARS-CoV-2 by FDA under an Emergency Use Authorization (EUA). This EUA will remain in effect (meaning this test can be used) for the duration of the COVID-19 declaration under Section 564(b)(1) of the Act, 21 U.S.C. section 360bbb-3(b)(1), unless the authorization is terminated or revoked.     Resp Syncytial Virus by PCR NEGATIVE NEGATIVE    Comment: (NOTE) Fact Sheet for Patients: bloggercourse.com  Fact Sheet  for Healthcare Providers: seriousbroker.it  This test is not yet approved or cleared by the [place]  FDA and has been authorized for detection and/or diagnosis of SARS-CoV-2 by FDA under an Emergency Use Authorization (EUA). This EUA will remain in effect (meaning this test can be used) for the duration of the COVID-19 declaration under Section 564(b)(1) of the Act, 21 U.S.C. section 360bbb-3(b)(1), unless the authorization is terminated or revoked.  Performed at [facility], [address] [place], [place] [address]   Urinalysis, w/ Reflex to Culture (Infection Suspected) -Urine, Unspecified Source     Status: None   Collection Time: 02/14/23  6:35 PM  Result Value Ref Range   Specimen Source URINE, UNSPE    Color, Urine YELLOW YELLOW   APPearance CLEAR CLEAR   Specific Gravity, Urine 1.017 1.005 - 1.030   pH 5.0 5.0 - 8.0   Glucose, UA NEGATIVE NEGATIVE mg/dL   Hgb urine dipstick NEGATIVE NEGATIVE   Bilirubin Urine NEGATIVE NEGATIVE   Ketones, ur NEGATIVE NEGATIVE mg/dL   Protein, ur NEGATIVE NEGATIVE mg/dL   Nitrite NEGATIVE NEGATIVE   Leukocytes,Ua NEGATIVE NEGATIVE   RBC / HPF 0-5 0 - 5 RBC/hpf   WBC, UA 0-5 0 - 5 WBC/hpf    Comment:        Reflex urine culture not performed if WBC <=10, OR if Squamous epithelial cells >5. If Squamous epithelial cells >5 suggest recollection.    Bacteria, UA NONE SEEN NONE SEEN   Squamous Epithelial / HPF 0-5 0 - 5 /HPF   Mucus PRESENT     Comment: Performed at [facility], [address] [place], [place] [address]  Blood Culture (routine x 2)     Status: None (Preliminary result)   Collection Time: 02/14/23  6:48 PM   Specimen: BLOOD  Result Value Ref Range   Specimen Description BLOOD RFA    Special Requests NONE    Culture      NO GROWTH < 12 HOURS Performed at [facility], [address] [place], [place] [address]    Report Status PENDING   Lactic acid, plasma     Status: None   Collection  Time: 02/14/23  6:56 PM  Result Value Ref Range   Lactic Acid, Venous 1.4 0.5 - 1.9 mmol/L    Comment: Performed at [facility], [address] [place], [place] [address]  Comprehensive metabolic panel     Status: Abnormal   Collection Time: 02/14/23  6:56 PM  Result Value Ref Range   Sodium 136 135 - 145 mmol/L   Potassium 3.6 3.5 - 5.1 mmol/L   Chloride 108 98 - 111 mmol/L   CO2 19 (L) 22 - 32 mmol/L   Glucose, Bld 109 (H) 70 - 99 mg/dL    Comment: Glucose reference range applies only to samples taken after fasting for at least 8 hours.   BUN 15 8 - 23 mg/dL   Creatinine, Ser 8.59 (H) 0.61 - 1.24 mg/dL   Calcium  9.1 8.9 - 10.3 mg/dL   Total Protein 7.1 6.5 - 8.1 g/dL  Albumin 3.9 3.5 - 5.0 g/dL   AST 26 15 - 41 U/L   ALT 17 0 - 44 U/L   Alkaline Phosphatase 41 38 - 126 U/L   Total Bilirubin 0.7 0.0 - 1.2 mg/dL   GFR, Estimated 51 (L) >60 mL/min    Comment: (NOTE) Calculated using the CKD-EPI Creatinine Equation (2021)    Anion gap 9 5 - 15    Comment: Performed at [facility], [address] [place], [place] [address]  CBC with Differential     Status: Abnormal   Collection Time: 02/14/23  6:56 PM  Result Value Ref Range   WBC 7.7 4.0 - 10.5 K/uL   RBC 3.43 (L) 4.22 - 5.81 MIL/uL   Hemoglobin 10.9 (L) 13.0 - 17.0 g/dL   HCT 66.6 (L) 60.9 - 47.9 %   MCV 97.1 80.0 - 100.0 fL   MCH 31.8 26.0 - 34.0 pg   MCHC 32.7 30.0 - 36.0 g/dL   RDW 85.4 88.4 - 84.4 %   Platelets 157 150 - 400 K/uL   nRBC 0.0 0.0 - 0.2 %   Neutrophils Relative % 90 %   Neutro Abs 7.0 1.7 - 7.7 K/uL   Lymphocytes Relative 3 %   Lymphs Abs 0.2 (L) 0.7 - 4.0 K/uL   Monocytes Relative 6 %   Monocytes Absolute 0.5 0.1 - 1.0 K/uL   Eosinophils Relative 0 %   Eosinophils Absolute 0.0 0.0 - 0.5 K/uL   Basophils Relative 1 %   Basophils Absolute 0.0 0.0 - 0.1 K/uL   Immature Granulocytes 0 %   Abs Immature Granulocytes 0.02 0.00 - 0.07 K/uL    Comment: Performed at [facility], [address]  [place], [place] [address]  Blood Culture (routine x 2)     Status: None (Preliminary result)   Collection Time: 02/14/23  6:58 PM   Specimen: BLOOD  Result Value Ref Range   Specimen Description BLOOD BLOOD RIGHT ARM    Special Requests NONE    Culture      NO GROWTH < 12 HOURS Performed at [facility], [address], [place], [place] [address]    Report Status PENDING   Lactic acid, plasma     Status: None   Collection Time: 02/14/23  8:54 PM  Result Value Ref Range   Lactic Acid, Venous 0.9 0.5 - 1.9 mmol/L    Comment: Performed at [facility], [address] [place], [place] [address]  Comprehensive metabolic panel     Status: Abnormal   Collection Time: 02/15/23  3:34 AM  Result Value Ref Range   Sodium 137 135 - 145 mmol/L   Potassium 3.6 3.5 - 5.1 mmol/L   Chloride 108 98 - 111 mmol/L   CO2 20 (L) 22 - 32 mmol/L   Glucose, Bld 105 (H) 70 - 99 mg/dL    Comment: Glucose reference range applies only to samples taken after fasting for at least 8 hours.   BUN 15 8 - 23 mg/dL   Creatinine, Ser 8.69 (H) 0.61 - 1.24 mg/dL   Calcium  8.8 (L) 8.9 - 10.3 mg/dL   Total Protein 6.4 (L) 6.5 - 8.1 g/dL   Albumin 3.5 3.5 - 5.0 g/dL   AST 32 15 - 41 U/L   ALT 17 0 - 44 U/L   Alkaline Phosphatase 36 (L) 38 - 126 U/L   Total Bilirubin 0.7 0.0 - 1.2 mg/dL   GFR, Estimated 56 (L) >60 mL/min  Comment: (NOTE) Calculated using the CKD-EPI Creatinine Equation (2021)    Anion gap 9 5 - 15    Comment: Performed at [facility], [address] [place], [place] [address]  CBC     Status: Abnormal   Collection Time: 02/15/23  3:34 AM  Result Value Ref Range   WBC 6.4 4.0 - 10.5 K/uL   RBC 3.38 (L) 4.22 - 5.81 MIL/uL   Hemoglobin 10.8 (L) 13.0 - 17.0 g/dL   HCT 67.1 (L) 60.9 - 47.9 %   MCV 97.0 80.0 - 100.0 fL   MCH 32.0 26.0 - 34.0 pg   MCHC 32.9 30.0 - 36.0 g/dL   RDW 85.4 88.4 - 84.4 %   Platelets 147 (L) 150 - 400 K/uL   nRBC 0.0 0.0 - 0.2 %    Comment: Performed at [facility], [address], [place], [place] [address]  Magnesium      Status: Abnormal   Collection Time: 02/15/23  3:34 AM  Result Value Ref Range   Magnesium  1.6 (L) 1.7 - 2.4 mg/dL    Comment: Performed at [facility], [address] [place], [place] [address]  Phosphorus     Status: Abnormal   Collection Time: 02/15/23  3:34 AM  Result Value Ref Range   Phosphorus 2.0 (L) 2.5 - 4.6 mg/dL    Comment: Performed at [facility], [address], [place], [place] [address]  CBG monitoring, ED     Status: None   Collection Time: 02/15/23  7:27 AM  Result Value Ref Range   Glucose-Capillary 93 70 - 99 mg/dL    Comment: Glucose reference range applies only to samples taken after fasting for at least 8 hours.  Procalcitonin     Status: None   Collection Time: 02/15/23  7:40 AM  Result Value Ref Range   Procalcitonin 0.20 ng/mL    Comment:        Interpretation: PCT (Procalcitonin) <= 0.5 ng/mL: Systemic infection (sepsis) is not likely. Local bacterial infection is possible. (NOTE)       Sepsis PCT Algorithm           Lower Respiratory Tract                                      Infection PCT Algorithm    ----------------------------     ----------------------------         PCT < 0.25 ng/mL                PCT < 0.10 ng/mL          Strongly encourage             Strongly discourage   discontinuation of antibiotics    initiation of antibiotics    ----------------------------     -----------------------------       PCT 0.25 - 0.50 ng/mL            PCT 0.10 - 0.25 ng/mL               OR       >80% decrease in PCT            Discourage initiation of  antibiotics      Encourage discontinuation           of antibiotics    ----------------------------     -----------------------------         PCT >= 0.50 ng/mL              PCT 0.26 - 0.50 ng/mL               AND        <80% decrease in PCT             Encourage initiation of                                              antibiotics       Encourage continuation           of antibiotics    ----------------------------     -----------------------------        PCT >= 0.50 ng/mL                  PCT > 0.50 ng/mL               AND         increase in PCT                  Strongly encourage                                      initiation of antibiotics    Strongly encourage escalation           of antibiotics                                     -----------------------------                                           PCT <= 0.25 ng/mL                                                 OR                                        > 80% decrease in PCT                                      Discontinue / Do not initiate                                             antibiotics  Performed at [facility], [address], [place], [place] [address]   Vitamin B12     Status: None   Collection Time: 02/15/23  7:40 AM  Result Value Ref Range   Vitamin B-12 201 180 - 914 pg/mL    Comment: (NOTE) This assay is not validated for testing neonatal or myeloproliferative syndrome specimens for Vitamin B12 levels. Performed at [facility], [address] [place], [place] [address]   TSH     Status: Abnormal   Collection Time: 02/15/23  7:40 AM  Result Value Ref Range   TSH 0.217 (L) 0.350 - 4.500 uIU/mL    Comment: Performed by a 3rd Generation assay with a functional sensitivity of <=0.01 uIU/mL. Performed at [facility], [address], [place], [place] [address]   Folate     Status: None   Collection Time: 02/15/23  7:40 AM  Result Value Ref Range   Folate 12.0 >5.9 ng/mL    Comment: Performed at [facility], [address] [place], [place] [address]  Blood gas, venous     Status: Abnormal   Collection Time: 02/15/23  7:40 AM  Result Value Ref Range   pH, Ven 7.42 7.25 - 7.43   pCO2, Ven 35 (L) 44 - 60 mmHg   pO2, Ven 60 (H) 32 - 45 mmHg   Bicarbonate 22.6 20.0 - 28.0 mmol/L   Acid-base deficit 1.1  0.0 - 2.0 mmol/L   O2 Saturation 93.6 %   Patient temperature 37.5    Collection site BLOOD LEFT HAND    Drawn by [id number]     Comment: Performed at [facility], [address] [place], [place] [address]   DG Chest Port 1 View Result Date: 02/14/2023 CLINICAL DATA:  Questionable sepsis EXAM: PORTABLE CHEST 1 VIEW COMPARISON:  Chest x-ray 03/30/2021 FINDINGS: The heart size and mediastinal contours are within normal limits. Both lungs are clear. The visualized skeletal structures are unremarkable. IMPRESSION: No active disease. Electronically Signed   By: Greig Pique M.D.   On: 02/14/2023 19:11    Pending Labs Unresulted Labs (From admission, onward)     Start     Ordered   02/16/23 0500  Basic metabolic panel  Tomorrow morning,   R        02/15/23 0712   02/16/23 0500  Magnesium   Tomorrow morning,   R        02/15/23 0712   02/16/23 0500  Phosphorus  Tomorrow morning,   R        02/15/23 0712   02/15/23 0712  T4, free  Once,   R        02/15/23 0711   02/14/23 1836  Resp panel by RT-PCR (RSV, Flu A&B, Covid) Anterior Nasal Swab  (SARS Coronavirus 2 by RT PCR (hospital order, performed in [facility] hospital lab) *cepheid single result test*)  Once,   URGENT        02/14/23 1835            Vitals/Pain Today's Vitals   02/15/23 0900 02/15/23 0930 02/15/23 1000 02/15/23 1030  BP: (!) 156/93 118/66 (!) 130/54 121/60  Pulse: (!) 105 91 63 66  Resp: (!) 25 (!) 23 18 14   Temp:      TempSrc:      SpO2: 91% 94% 90% 93%  PainSc:        Isolation Precautions No active isolations  Medications Medications  oseltamivir  (TAMIFLU ) capsule 75 mg (0 mg Oral Hold 02/15/23 0916)  heparin  injection 5,000 Units (5,000 Units Subcutaneous Given 02/15/23 0610)  acetaminophen  (TYLENOL ) tablet 650 mg (650 mg Oral Given 02/15/23 0524)    Or  acetaminophen  (TYLENOL ) suppository 650 mg ( Rectal See Alternative 02/15/23 0524)  ondansetron  (ZOFRAN ) tablet 4 mg (has no administration in time range)     Or  ondansetron  (ZOFRAN ) injection 4 mg (has no administration in time range)  rosuvastatin  (CRESTOR ) tablet 20 mg (20 mg Oral Given 02/15/23 0930)  ezetimibe  (ZETIA ) tablet 10 mg (10 mg Oral Given 02/15/23 0930)  amLODipine  (NORVASC ) tablet 5 mg (5 mg Oral Given 02/15/23 0930)  lactated ringers  infusion ( Intravenous New Bag/Given 02/15/23 0735)  ipratropium-albuterol  (DUONEB) 0.5-2.5 (3) MG/3ML nebulizer solution 3 mL (3 mLs Nebulization Given 02/15/23 0736)  budesonide  (PULMICORT ) nebulizer solution 0.5 mg (0.5 mg Nebulization Given 02/15/23 0736)  arformoterol  (BROVANA ) nebulizer solution 15 mcg (15 mcg Nebulization Given 02/15/23 0736)  methylPREDNISolone  sodium succinate (SOLU-MEDROL ) 40 mg/mL injection 40 mg (40 mg Intravenous Given 02/15/23 0739)  phosphorus (K PHOS  NEUTRAL) tablet 500 mg (500 mg Oral Given 02/15/23 0931)  lactated ringers  bolus 1,000 mL (0 mLs Intravenous Stopped 02/14/23 2127)  magnesium  sulfate IVPB 2 g 50 mL (0 g Intravenous Stopped 02/15/23 0842)    Mobility walks with person assist     Focused Assessments Pulmonary Assessment Handoff:  Lung sounds: Bilateral Breath Sounds: Diminished, Expiratory wheezes L Breath Sounds: Diminished, Expiratory wheezes R Breath Sounds: Diminished, Expiratory wheezes O2 Device: Nasal Cannula O2 Flow Rate (L/min): 4 L/min    R Recommendations: See Admitting Provider Note  Report given to:   Additional Notes:

## 2023-02-15 NOTE — Progress Notes (Signed)
 Pt up to bathroom with standby assistance, dyspnea noted with exertion, pt remains on 2 lpm [place]. Pt had medium sized BM and voided. Pt ambulated back to bed, tolerated fair. O2 rechecked with ambulation, SaO2 100% with 2lpm [place]. O2 decreased to 1 lpm [place] and pt able to maintain SaO2 94-96% without complaint of SOB.

## 2023-02-15 NOTE — H&P (Signed)
 History and Physical    Patient: Timothy Webster FMW:[id number] DOB: [birth year] DOA: 02/14/2023 DOS: the patient was seen and examined on 02/15/2023 PCP: Leigh Lung, MD  Patient coming from: Home  Chief Complaint:  Chief Complaint  Patient presents with   Altered Mental Status    Per family, patient is altered. Woke up later than normal. Speech off. Bizarre behavior at home that is not his baseline. Not recognizing people at home. 102.4 Temp with EMS. Given 1000mg  tylenol  PO and 1g rochephin IM by EMS. A+Ox3 with ems and in triage, disoriented to date. Patient calm, cooperative and pleasant. Patient denies any pains, urinary complaints. Patient states he is here for his eye causing him issues, abnormal appearance noted to left eye, reported to be baseline.     HPI: Timothy Webster is a 78 y.o. male with medical history significant of hypertension, hyperlipidemia who presents to the emergency department due to altered mental status.  At bedside, patient only complaining of pain behind his eyes.  He was unable to provide further history.  Further history was obtained from ED physician and ED medical record.  Per report, family reported that patient was altered and that his speech was off, odd behavior at home such as not recognizing people at home (different from baseline).  EMS was activated, on arrival of EMS team, patient was noted to have a temperature 102.4 F, 1000 mg of Tylenol  was given, 1 g of Rocephin IM was given.  He was reported to be alert and oriented, but was disoriented to date.  Patient states that his wife was sick with cold symptoms at home, he states that he already obtained flu vaccine.  ED Course: In the emergency department, he was febrile with a temperature of 103.2, respiratory rate was 21, BP was 122/47, pulse 83/min, O2 sat was 93% on room air.  Workup in the ED showed normocytic anemia, BMP was normal except for bicarb of 19, blood glucose 109, creatinine 1.40, GFR 51,  urinalysis was normal.  Influenza A was positive, influenza B, SARS coronavirus 2, RSV was negative. Chest x-ray showed no active disease IV hydration was provided.  Hospitalist was asked to admit patient for further evaluation and management.  Review of Systems: Review of systems as noted in the HPI. All other systems reviewed and are negative.   Past Medical History:  Diagnosis Date   Glaucoma 02/25/2018   Hyperlipidemia    Hypertension    PAD (peripheral artery disease) (HCC)    Sinus bradycardia on ECG 02/25/2018   EKG 10/12/2015: Marked sinus bradycardia at rate of 44 bpm, normal axis. No evidence of ischemia otherwise normal EKG. No significant change from EKG 04/19/2015: Normal sinus rhythm at rate of 52 bpm, EKG 12/24/2013: Marked sinus bradycardia at the rate of 39 bpm.   Tobacco use disorder, severe, dependence 02/25/2018   Past Surgical History:  Procedure Laterality Date   INSERTION OF ILIAC STENT  03/15/2014   Procedure: INSERTION OF ILIAC STENT;  Surgeon: Gordy Bergamo, MD;  Location: [facility] CATH LAB;  Service: Cardiovascular;;  LEIA    LOWER EXTREMITY ANGIOGRAM N/A 03/15/2014   Procedure: LOWER EXTREMITY ANGIOGRAM;  Surgeon: Gordy Bergamo, MD;  Location: [facility] CATH LAB;  Service: Cardiovascular;  Laterality: N/A;   LOWER EXTREMITY ANGIOGRAM Right 04/26/2014   Procedure: LOWER EXTREMITY ANGIOGRAM;  Surgeon: Gordy Bergamo, MD;  Location: [facility] CATH LAB;  Service: Cardiovascular;  Laterality: Right;   LOWER EXTREMITY ANGIOGRAPHY Bilateral 01/28/2017   Procedure: LOWER EXTREMITY  ANGIOGRAPHY;  Surgeon: Ladona Heinz, MD;  Location: [facility] INVASIVE CV LAB;  Service: Cardiovascular;  Laterality: Bilateral;   LOWER EXTREMITY ANGIOGRAPHY N/A 06/15/2019   Procedure: LOWER EXTREMITY ANGIOGRAPHY;  Surgeon: Ladona Heinz, MD;  Location: [facility] INVASIVE CV LAB;  Service: Cardiovascular;  Laterality: N/A;   LOWER EXTREMITY ANGIOGRAPHY N/A 05/01/2021   Procedure: Lower Extremity Angiography;  Surgeon: Ladona Heinz, MD;  Location: [facility]  INVASIVE CV LAB;  Service: Cardiovascular;  Laterality: N/A;   PERIPHERAL VASCULAR ATHERECTOMY  07/20/2019   Procedure: PERIPHERAL VASCULAR ATHERECTOMY;  Surgeon: Ladona Heinz, MD;  Location: [facility] INVASIVE CV LAB;  Service: Cardiovascular;;  Rt SFA   PERIPHERAL VASCULAR BALLOON ANGIOPLASTY  06/15/2019   Procedure: PERIPHERAL VASCULAR BALLOON ANGIOPLASTY;  Surgeon: Ladona Heinz, MD;  Location: [facility] INVASIVE CV LAB;  Service: Cardiovascular;;  Left SFA   PERIPHERAL VASCULAR CATHETERIZATION Bilateral 05/16/2015   Procedure: Lower Extremity Angiography;  Surgeon: Heinz Ladona, MD;  Location: [facility] INVASIVE CV LAB;  Service: Cardiovascular;  Laterality: Bilateral;   PERIPHERAL VASCULAR CATHETERIZATION Right 05/16/2015   Procedure: Peripheral Vascular Atherectomy;  Surgeon: Heinz Ladona, MD;  Location: [facility] INVASIVE CV LAB;  Service: Cardiovascular;  Laterality: Right;  sfa   PERIPHERAL VASCULAR INTERVENTION  06/15/2019   Procedure: PERIPHERAL VASCULAR INTERVENTION;  Surgeon: Ladona Heinz, MD;  Location: [facility] INVASIVE CV LAB;  Service: Cardiovascular;;  LEIA stent   PERIPHERAL VASCULAR INTERVENTION  05/01/2021   Procedure: PERIPHERAL VASCULAR INTERVENTION;  Surgeon: Ladona Heinz, MD;  Location: [facility] INVASIVE CV LAB;  Service: Cardiovascular;;   REFRACTIVE SURGERY     Right Eye    Social History:  reports that he quit smoking about 7 years ago. His smoking use included cigarettes. He started smoking about 22 years ago. He has a 15 pack-year smoking history. He has never used smokeless tobacco. He reports that he does not drink alcohol and does not use drugs.   No Known Allergies  Family History  Problem Relation Age of Onset   Hypertension Mother    Hyperlipidemia Mother    Heart disease Mother      Prior to Admission medications   Medication Sig Start Date End Date Taking? Authorizing Provider  acetaminophen  (TYLENOL ) 500 MG tablet Take 1,000 mg by mouth every 6 (six) hours as needed (pain).    [provider]   amLODipine -benazepril  (LOTREL) 10-40 MG capsule Take 1 capsule by mouth daily. 10/23/22   Ladona Heinz, MD  aspirin  EC 81 MG tablet Take 81 mg by mouth in the morning.    [provider]  brimonidine  (ALPHAGAN ) 0.2 % ophthalmic solution Place 1 drop into the right eye 2 (two) times daily. 02/26/19   [provider]  cilostazol  (PLETAL ) 100 MG tablet Take 1 tablet by mouth twice daily 12/18/22   [name], Jay, MD  dorzolamide -timolol  (COSOPT ) 22.3-6.8 MG/ML ophthalmic solution Place 1 drop into the right eye 2 (two) times daily.    [provider]  ezetimibe  (ZETIA ) 10 MG tablet Take 1 tablet by mouth once daily 12/13/22   Ladona Heinz, MD  gabapentin  (NEURONTIN ) 300 MG capsule TAKE 1 CAPSULE BY MOUTH IN THE MORNING , AT NOON AND AT BEDTIME TRY 3 TIMES A DAY TO SEE IF SYMPTOMS OF LEG PAIN IMPROVES 08/30/21   Ladona Heinz, MD  latanoprost  (XALATAN ) 0.005 % ophthalmic solution Place 1 drop into the right eye at bedtime.    [provider]  Menthol-Methyl Salicylate (MUSCLE RUB) 10-15 % CREA Apply 1 application. topically 4 (four) times daily as needed  for muscle pain.    [provider]  nitroGLYCERIN  (NITROSTAT ) 0.4 MG SL tablet DISSOLVE ONE TABLET UNDER THE TONGUE EVERY 5 MINUTES AS NEEDED FOR CHEST PAIN.  DO NOT EXCEED A TOTAL OF 3 DOSES IN 15 MINUTES 10/18/20   Ladona Heinz, MD  rosuvastatin  (CRESTOR ) 20 MG tablet Take 1 tablet by mouth once daily 06/11/22   Ladona Heinz, MD    Physical Exam: BP (!) 135/54   Pulse 77   Temp 100.1 F (37.8 C)   Resp (!) 22   SpO2 96%   General: 78 y.o. year-old male well developed well nourished in no acute distress.   HEENT: NCAT, PERRL Neck: Supple, trachea medial Cardiovascular: Regular rate and rhythm with no rubs or gallops.  No thyromegaly or JVD noted.  No lower extremity edema. 2/4 pulses in all 4 extremities. Respiratory: Clear to auscultation with no wheezes or rales. Good inspiratory effort. Abdomen: Soft, nontender  nondistended with normal bowel sounds x4 quadrants. Muskuloskeletal: No cyanosis, clubbing or edema noted bilaterally Neuro: No focal neurologic deficit, noted confusion  Skin: No ulcerative lesions noted or rashes Psychiatry: Mood is appropriate for condition and setting          Labs on Admission:  Basic Metabolic Panel: Recent Labs  Lab 02/14/23 1856  NA 136  K 3.6  CL 108  CO2 19*  GLUCOSE 109*  BUN 15  CREATININE 1.40*  CALCIUM  9.1   Liver Function Tests: Recent Labs  Lab 02/14/23 1856  AST 26  ALT 17  ALKPHOS 41  BILITOT 0.7  PROT 7.1  ALBUMIN 3.9   No results for input(s): LIPASE, AMYLASE in the last 168 hours. No results for input(s): AMMONIA in the last 168 hours. CBC: Recent Labs  Lab 02/14/23 1856  WBC 7.7  NEUTROABS 7.0  HGB 10.9*  HCT 33.3*  MCV 97.1  PLT 157   Cardiac Enzymes: No results for input(s): CKTOTAL, CKMB, CKMBINDEX, TROPONINI in the last 168 hours.  BNP (last 3 results) No results for input(s): BNP in the last 8760 hours.  ProBNP (last 3 results) No results for input(s): PROBNP in the last 8760 hours.  CBG: No results for input(s): GLUCAP in the last 168 hours.  Radiological Exams on Admission: DG Chest Port 1 View Result Date: 02/14/2023 CLINICAL DATA:  Questionable sepsis EXAM: PORTABLE CHEST 1 VIEW COMPARISON:  Chest x-ray 03/30/2021 FINDINGS: The heart size and mediastinal contours are within normal limits. Both lungs are clear. The visualized skeletal structures are unremarkable. IMPRESSION: No active disease. Electronically Signed   By: Greig Pique M.D.   On: 02/14/2023 19:11    EKG: I independently viewed the EKG done and my findings are as followed: Normal sinus rhythm at a rate of 69 bpm  Assessment/Plan Present on Admission:  Influenza A  Essential hypertension  Mixed hyperlipidemia  Principal Problem:   Influenza A Active Problems:   Mixed hyperlipidemia   Essential hypertension    Acute metabolic encephalopathy   AKI (acute kidney injury) (HCC)   Dehydration  Influenza A Continue Tamiflu  Continue Tylenol  as needed Continue supportive measures  Acute metabolic encephalopathy This may be due to above Continue fall precaution Continue management as per above  Acute kidney injury Dehydration Creatinine at 1.40 (baseline creatinine at 0.8-1.0) Continue IV hydration Renally adjust medications, avoid nephrotoxic agents/dehydration/hypotension  Essential hypertension Continue amlodipine , benazepril   Mixed hyperlipidemia Continue Crestor , Zetia   DVT prophylaxis: Heparin  subcu  Code Status: Full code  Family Communication: None at bedside  Consults: None  Severity of Illness: The appropriate patient status for this patient is INPATIENT. Inpatient status is judged to be reasonable and necessary in order to provide the required intensity of service to ensure the patient's safety. The patient's presenting symptoms, physical exam findings, and initial radiographic and laboratory data in the context of their chronic comorbidities is felt to place them at high risk for further clinical deterioration. Furthermore, it is not anticipated that the patient will be medically stable for discharge from the hospital within 2 midnights of admission.   * I certify that at the point of admission it is my clinical judgment that the patient will require inpatient hospital care spanning beyond 2 midnights from the point of admission due to high intensity of service, high risk for further deterioration and high frequency of surveillance required.*  Author: Harriette Tovey, DO 02/15/2023 3:05 AM  For on call review [email or web address].

## 2023-02-15 NOTE — Hospital Course (Signed)
 78 year old male with a history of hypertension, hyperlipidemia, peripheral artery disease, tobacco abuse in remission presenting with altered mental status and confusion.  On the morning of 02/14/2023, the patient woke up later than usual.  His wife subsequently noted that the patient was confused when he woke up.  The patient thought he was at somebody else's house and wanted to get out of the house.  Apparently, the patient was  walking into the walls.  EMS was activated, and the patient was noted to have a temperature of 102 point degrees Fahrenheit. EMS gave the patient ceftriaxone 1 g IM and acetaminophen  given prior to the patient arriving to the emergency department.  In speaking with the patient and his wife, the patient had been having worsening nonproductive cough over the past 2 to 3 days.  There is no recent history of fevers, chills, chest pain, worsening shortness of breath, nausea, vomiting or diarrhea, abdominal pain. At baseline, the patient is independent with his ADLs.  In fact, the patient still drives a car locally.  There is not been any new medications.  In the ED, the patient was febrile to 103.1 F and tachycardic in 110s.  He was hemodynamically stable.  Oxygen saturation was 89% on room air.  He was placed on oxygen 2 L with saturation up to 96%. WBC 7.7, hemoglobin 10.9, platelets 157.  Sodium 136, potassium 3.6, bicarbonate of 19, serum creatinine 1.40.  Chest x-ray showed chronic interstitial prominence.  EKG shows sinus rhythm nonspecific T wave changes.  Lactic acid 1.4>> 0.9.  Influenza A was positive.  The patient was started on IV fluids after being given 1 L LR.  UA was negative for pyuria.

## 2023-02-15 NOTE — Progress Notes (Addendum)
 PROGRESS NOTE  Timothy Webster FMW:[id number] DOB: June 25, 1945 DOA: 02/14/2023 PCP: Leigh Lung, MD  Brief History:  78 year old male with a history of hypertension, hyperlipidemia, peripheral artery disease, tobacco abuse in remission presenting with altered mental status and confusion.  On the morning of 02/14/2023, the patient woke up later than usual.  His wife subsequently noted that the patient was confused when he woke up.  The patient thought he was at somebody else's house and wanted to get out of the house.  Apparently, the patient was  walking into the walls.  EMS was activated, and the patient was noted to have a temperature of 102 point degrees Fahrenheit. EMS gave the patient ceftriaxone 1 g IM and acetaminophen  given prior to the patient arriving to the emergency department.  In speaking with the patient and his wife, the patient had been having worsening nonproductive cough over the past 2 to 3 days.  There is no recent history of fevers, chills, chest pain, worsening shortness of breath, nausea, vomiting or diarrhea, abdominal pain. At baseline, the patient is independent with his ADLs.  In fact, the patient still drives a car locally.  There is not been any new medications.  In the ED, the patient was febrile to 103.1 F and tachycardic in 110s.  He was hemodynamically stable.  Oxygen saturation was 89% on room air.  He was placed on oxygen 2 L with saturation up to 96%. WBC 7.7, hemoglobin 10.9, platelets 157.  Sodium 136, potassium 3.6, bicarbonate of 19, serum creatinine 1.40.  Chest x-ray showed chronic interstitial prominence.  EKG shows sinus rhythm nonspecific T wave changes.  Lactic acid 1.4>> 0.9.  Influenza A was positive.  The patient was started on IV fluids after being given 1 L LR.  UA was negative for pyuria.   Assessment/Plan: Acute respiratory failure with hypoxia -Secondary to COPD exacerbation in the setting of influenza -Oxygen saturation 89% room  air -Stable on 2 L -Check VBG -Wean oxygen as tolerated  COPD exacerbation -Start Brovana  -Start Pulmicort  -Start DuoNebs -Start Solu-Medrol   Influenza A -Start oseltamavir  Acute metabolic encephalopathy -Multifactorial including influenza A and AKI -B12 -TSH -UA negative for pyuria  Acute kidney injury -Secondary to volume depletion -Baseline creatinine 0.8-1.0 -Presented with serum creatinine 1.40 -Continue IV fluids -Hold benazepril   Essential hypertension -Restart amlodipine  -Holding benazepril   Mixed hyperlipidemia -Continue statin and Zetia   Hypomagnesemia/Hypophosphatemia -replete             Family Communication: spouse 2/8  Consultants:  none  Code Status:  FULL   DVT Prophylaxis:  [place] Heparin    Procedures: As Listed in Progress Note Above  Antibiotics: None   Total time spent 50 minutes.  Greater than 50% spent face to face counseling and coordinating care.    Subjective: Patient complains of a nonproductive cough.  He denies any chest pain, nausea, vomiting or diarrhea, abdominal pain.  Objective: Vitals:   02/15/23 0230 02/15/23 0300 02/15/23 0430 02/15/23 0519  BP: (!) 147/53 (!) 135/54 (!) 145/66   Pulse: 79 77 (!) 106   Resp:   (!) 27   Temp:    (!) 103.1 F (39.5 C)  TempSrc:    Oral  SpO2: 96% 96% 96%     Intake/Output Summary (Last 24 hours) at 02/15/2023 0703 Last data filed at 02/15/2023 0430 Gross per 24 hour  Intake 1000 ml  Output 150 ml  Net 850 ml  Weight change:  Exam:  General:  Pt is alert, follows commands appropriately, not in acute distress HEENT: No icterus, No thrush, No neck mass, Cale/AT;  Left eye opaque, Cardiovascular: RRR, S1/S2, no rubs, no gallops Respiratory: Bilateral rales.  Bilateral expiratory wheeze. Abdomen: Soft/+BS, non tender, non distended, no guarding Extremities: No edema, No lymphangitis, No petechiae, No rashes, no synovitis Neuro:  CN II-XII intact, strength 4/5 in  RUE, RLE, strength 4/5 LUE, LLE; sensation intact bilateral; no dysmetria; babinski equivocal    Data Reviewed: I have personally reviewed following labs and imaging studies Basic Metabolic Panel: Recent Labs  Lab 02/14/23 1856 02/15/23 0334  NA 136 137  K 3.6 3.6  CL 108 108  CO2 19* 20*  GLUCOSE 109* 105*  BUN 15 15  CREATININE 1.40* 1.30*  CALCIUM  9.1 8.8*  MG  --  1.6*  PHOS  --  2.0*   Liver Function Tests: Recent Labs  Lab 02/14/23 1856 02/15/23 0334  AST 26 32  ALT 17 17  ALKPHOS 41 36*  BILITOT 0.7 0.7  PROT 7.1 6.4*  ALBUMIN 3.9 3.5   No results for input(s): LIPASE, AMYLASE in the last 168 hours. No results for input(s): AMMONIA in the last 168 hours. Coagulation Profile: No results for input(s): INR, PROTIME in the last 168 hours. CBC: Recent Labs  Lab 02/14/23 1856 02/15/23 0334  WBC 7.7 6.4  NEUTROABS 7.0  --   HGB 10.9* 10.8*  HCT 33.3* 32.8*  MCV 97.1 97.0  PLT 157 147*   Cardiac Enzymes: No results for input(s): CKTOTAL, CKMB, CKMBINDEX, TROPONINI in the last 168 hours. BNP: Invalid input(s): POCBNP CBG: No results for input(s): GLUCAP in the last 168 hours. HbA1C: No results for input(s): HGBA1C in the last 72 hours. Urine analysis:    Component Value Date/Time   COLORURINE YELLOW 02/14/2023 1835   APPEARANCEUR CLEAR 02/14/2023 1835   LABSPEC 1.017 02/14/2023 1835   PHURINE 5.0 02/14/2023 1835   GLUCOSEU NEGATIVE 02/14/2023 1835   HGBUR NEGATIVE 02/14/2023 1835   BILIRUBINUR NEGATIVE 02/14/2023 1835   KETONESUR NEGATIVE 02/14/2023 1835   PROTEINUR NEGATIVE 02/14/2023 1835   NITRITE NEGATIVE 02/14/2023 1835   LEUKOCYTESUR NEGATIVE 02/14/2023 1835   Sepsis Labs: @LABRCNTIP (procalcitonin:4,lacticidven:4) ) Recent Results (from the past 240 hours)  Resp panel by RT-PCR (RSV, Flu A&B, Covid) Anterior Nasal Swab     Status: Abnormal   Collection Time: 02/14/23  6:35 PM   Specimen: Anterior Nasal Swab   Result Value Ref Range Status   SARS Coronavirus 2 by RT PCR NEGATIVE NEGATIVE Final    Comment: (NOTE) SARS-CoV-2 target nucleic acids are NOT DETECTED.  The SARS-CoV-2 RNA is generally detectable in upper respiratory specimens during the acute phase of infection. The lowest concentration of SARS-CoV-2 viral copies this assay can detect is 138 copies/mL. A negative result does not preclude SARS-Cov-2 infection and should not be used as the sole basis for treatment or other patient management decisions. A negative result may occur with  improper specimen collection/handling, submission of specimen other than nasopharyngeal swab, presence of viral mutation(s) within the areas targeted by this assay, and inadequate number of viral copies(<138 copies/mL). A negative result must be combined with clinical observations, patient history, and epidemiological information. The expected result is Negative.  Fact Sheet for Patients:  bloggercourse.com  Fact Sheet for Healthcare Providers:  seriousbroker.it  This test is no t yet approved or cleared by the [place]  FDA and  has been authorized for detection  and/or diagnosis of SARS-CoV-2 by FDA under an Emergency Use Authorization (EUA). This EUA will remain  in effect (meaning this test can be used) for the duration of the COVID-19 declaration under Section 564(b)(1) of the Act, 21 U.S.C.section 360bbb-3(b)(1), unless the authorization is terminated  or revoked sooner.       Influenza A by PCR POSITIVE (A) NEGATIVE Final   Influenza B by PCR NEGATIVE NEGATIVE Final    Comment: (NOTE) The Xpert Xpress SARS-CoV-2/FLU/RSV plus assay is intended as an aid in the diagnosis of influenza from Nasopharyngeal swab specimens and should not be used as a sole basis for treatment. Nasal washings and aspirates are unacceptable for Xpert Xpress SARS-CoV-2/FLU/RSV testing.  Fact Sheet for  Patients: bloggercourse.com  Fact Sheet for Healthcare Providers: seriousbroker.it  This test is not yet approved or cleared by the [place]  FDA and has been authorized for detection and/or diagnosis of SARS-CoV-2 by FDA under an Emergency Use Authorization (EUA). This EUA will remain in effect (meaning this test can be used) for the duration of the COVID-19 declaration under Section 564(b)(1) of the Act, 21 U.S.C. section 360bbb-3(b)(1), unless the authorization is terminated or revoked.     Resp Syncytial Virus by PCR NEGATIVE NEGATIVE Final    Comment: (NOTE) Fact Sheet for Patients: bloggercourse.com  Fact Sheet for Healthcare Providers: seriousbroker.it  This test is not yet approved or cleared by the [place]  FDA and has been authorized for detection and/or diagnosis of SARS-CoV-2 by FDA under an Emergency Use Authorization (EUA). This EUA will remain in effect (meaning this test can be used) for the duration of the COVID-19 declaration under Section 564(b)(1) of the Act, 21 U.S.C. section 360bbb-3(b)(1), unless the authorization is terminated or revoked.  Performed at [facility], [address], [place], [place] [address]   Blood Culture (routine x 2)     Status: None (Preliminary result)   Collection Time: 02/14/23  6:48 PM   Specimen: BLOOD  Result Value Ref Range Status   Specimen Description BLOOD RFA  Final   Special Requests NONE  Final   Culture   Final    NO GROWTH < 12 HOURS Performed at [facility], [address], [place], [place] [address]    Report Status PENDING  Incomplete  Blood Culture (routine x 2)     Status: None (Preliminary result)   Collection Time: 02/14/23  6:58 PM   Specimen: BLOOD  Result Value Ref Range Status   Specimen Description BLOOD BLOOD RIGHT ARM  Final   Special Requests NONE  Final   Culture   Final    NO GROWTH <  12 HOURS Performed at [facility], [address], [place], [place] [address]    Report Status PENDING  Incomplete     Scheduled Meds:  amLODipine   10 mg Oral Daily   And   benazepril   40 mg Oral Daily   ezetimibe   10 mg Oral Daily   heparin   5,000 Units Subcutaneous Q8H   oseltamivir   75 mg Oral BID   rosuvastatin   20 mg Oral Daily   Continuous Infusions:  lactated ringers  100 mL/hr at 02/15/23 9541    Procedures/Studies: DG Chest Port 1 View Result Date: 02/14/2023 CLINICAL DATA:  Questionable sepsis EXAM: PORTABLE CHEST 1 VIEW COMPARISON:  Chest x-ray 03/30/2021 FINDINGS: The heart size and mediastinal contours are within normal limits. Both lungs are clear. The visualized skeletal structures are unremarkable. IMPRESSION: No active disease. Electronically Signed   By:  Greig Pique M.D.   On: 02/14/2023 19:11    Alm Schneider, DO  [facility]  If 7PM-7AM, please contact night-coverage [email or web address] Password [email or web address] 02/15/2023, 7:03 AM   LOS: 0 days

## 2023-02-15 NOTE — Plan of Care (Signed)

## 2023-02-15 NOTE — Progress Notes (Signed)
 Pt arrived via stretcher from ED to room 318. Pt A&O x3, denies c/o at present. Pt states feels weak, was assisted to move from stretcher to bed by NT. Pt oriented to room and safety precautions, bed alarm on and call bell within reach, advised to call for needs, pt states understanding.

## 2023-02-16 DIAGNOSIS — J101 Influenza due to other identified influenza virus with other respiratory manifestations: Secondary | ICD-10-CM | POA: Diagnosis not present

## 2023-02-16 DIAGNOSIS — N179 Acute kidney failure, unspecified: Secondary | ICD-10-CM | POA: Diagnosis not present

## 2023-02-16 DIAGNOSIS — J9601 Acute respiratory failure with hypoxia: Secondary | ICD-10-CM

## 2023-02-16 DIAGNOSIS — G9341 Metabolic encephalopathy: Secondary | ICD-10-CM | POA: Diagnosis not present

## 2023-02-16 LAB — BASIC METABOLIC PANEL
Anion gap: 9 (ref 5–15)
BUN: 18 mg/dL (ref 8–23)
CO2: 21 mmol/L — ABNORMAL LOW (ref 22–32)
Calcium: 8.8 mg/dL — ABNORMAL LOW (ref 8.9–10.3)
Chloride: 109 mmol/L (ref 98–111)
Creatinine, Ser: 1.08 mg/dL (ref 0.61–1.24)
GFR, Estimated: >60 mL/min (ref >60)
Glucose, Bld: 150 mg/dL — ABNORMAL HIGH (ref 70–99)
Potassium: 3.7 mmol/L (ref 3.5–5.1)
Sodium: 139 mmol/L (ref 135–145)

## 2023-02-16 LAB — PHOSPHORUS: Phosphorus: 3.2 mg/dL (ref 2.5–4.6)

## 2023-02-16 LAB — MAGNESIUM: Magnesium: 2 mg/dL (ref 1.7–2.4)

## 2023-02-16 MED ORDER — CYANOCOBALAMIN 500 MCG PO TABS: 500.0000 ug | ORAL_TABLET | Freq: Every day | ORAL | Status: AC

## 2023-02-16 MED ORDER — CYANOCOBALAMIN 500 MCG PO TABS: 500.0000 ug | ORAL_TABLET | Freq: Every day | ORAL | Status: DC

## 2023-02-16 MED ORDER — PREDNISONE 20 MG PO TABS: 60.0000 mg | ORAL_TABLET | Freq: Every day | ORAL | Status: DC

## 2023-02-16 MED ORDER — OSELTAMIVIR PHOSPHATE 30 MG PO CAPS: 30.0000 mg | ORAL_CAPSULE | Freq: Two times a day (BID) | ORAL | 0 refills | Status: DC

## 2023-02-16 MED ORDER — OSELTAMIVIR PHOSPHATE 30 MG PO CAPS
30.0000 mg | ORAL_CAPSULE | Freq: Two times a day (BID) | ORAL | Status: DC
  Administered 2023-02-16: 30 mg via ORAL
  Filled 2023-02-16: qty 1

## 2023-02-16 MED ORDER — PREDNISONE 10 MG PO TABS: 60.0000 mg | ORAL_TABLET | Freq: Every day | ORAL | 0 refills | Status: DC

## 2023-02-16 MED ORDER — IPRATROPIUM-ALBUTEROL 0.5-2.5 (3) MG/3ML IN SOLN
3.0000 mL | Freq: Three times a day (TID) | RESPIRATORY_TRACT | Status: DC
  Administered 2023-02-16: 3 mL via RESPIRATORY_TRACT
  Filled 2023-02-16 (×2): qty 3

## 2023-02-16 NOTE — Plan of Care (Signed)
  Problem: Education: Goal: Knowledge of General Education information will improve Description: Including pain rating scale, medication(s)/side effects and non-pharmacologic comfort measures Outcome: Progressing   Problem: Health Behavior/Discharge Planning: Goal: Ability to manage health-related needs will improve Outcome: Progressing   Problem: Clinical Measurements: Goal: Ability to maintain clinical measurements within normal limits will improve Outcome: Progressing   Problem: Elimination: Goal: Will not experience complications related to bowel motility Outcome: Progressing   Problem: Safety: Goal: Ability to remain free from injury will improve Outcome: Progressing

## 2023-02-16 NOTE — Evaluation (Signed)
 Physical Therapy Evaluation Patient Details Name: Timothy Webster MRN: [id number] DOB: [birth year] Today's Date: 02/16/2023  History of Present Illness  Per MD:  Patient brought in for mental status change.  Reportedly confused and not recognizing people.  Temperature 102.4 for EMS.  Patient is awake and pleasant.  States he does feel little bad.  States he has had some urinary frequency which is not unusual.  Denies cough.  States his wife has been sick with a cold.  Does have some confusion.  Clinical Impression  PT I with bed mobility and transfers, mod I with ambulation.  Ambulated on room air x 90 ft with O2 at 94        If plan is discharge home, recommend the following: Help with stairs or ramp for entrance   Can travel by private vehicle    yes    Equipment Recommendations None recommended by PT  Recommendations for Other Services    none   Functional Status Assessment Patient has had a recent decline in their functional status and demonstrates the ability to make significant improvements in function in a reasonable and predictable amount of time.     Precautions / Restrictions Precautions Precautions: None Restrictions Weight Bearing Restrictions Per Provider Order: No      Mobility  Bed Mobility Overal bed mobility: Independent                  Transfers Overall transfer level: Independent                      Ambulation/Gait Ambulation/Gait assistance: Independent Gait Distance (Feet): 85 Feet Assistive device: None Gait Pattern/deviations: Decreased step length - right, Decreased step length - left                Pertinent Vitals/Pain Pain Assessment Pain Assessment: No/denies pain    Home Living Family/patient expects to be discharged to:: Private residence Living Arrangements: Spouse/significant other Available Help at Discharge: Family Type of Home: House                  Prior Function  I                      Extremity/Trunk Assessment        Lower Extremity Assessment Lower Extremity Assessment: Overall WFL for tasks assessed       Communication   Communication Communication: No apparent difficulties Cueing Techniques: Verbal cues  Cognition Arousal: Alert Behavior During Therapy: WFL for tasks assessed/performed Overall Cognitive Status: Within Functional Limits for tasks assessed                                                 Assessment/Plan    PT Assessment Patient does not need any further PT services     PT Treatment Interventions      PT Goals (Current goals can be found in the Care Plan section)  Acute Rehab PT Goals Patient Stated Goal: to go home PT Goal Formulation: With patient Time For Goal Achievement: 02/17/23 Potential to Achieve Goals: Good            AM-PAC PT 6 Clicks Mobility  Outcome Measure Help needed turning from your back to your side while in a flat bed without using bedrails?: None Help needed moving from lying on  your back to sitting on the side of a flat bed without using bedrails?: None Help needed moving to and from a bed to a chair (including a wheelchair)?: None Help needed standing up from a chair using your arms (e.g., wheelchair or bedside chair)?: None Help needed to walk in hospital room?: None Help needed climbing 3-5 steps with a railing? : A Little 6 Click Score: 23    End of Session Equipment Utilized During Treatment: Gait belt Activity Tolerance: Patient tolerated treatment well Patient left: in bed;with call bell/phone within reach Nurse Communication: Mobility status PT Visit Diagnosis: Muscle weakness (generalized) (M62.81)    Time: 8885-8865 PT Time Calculation (min) (ACUTE ONLY): 20 min   Charges:   PT Evaluation $PT Eval Low Complexity: 1 Low   PT General Charges $$ ACUTE PT VISIT: 1 Visit        Montie Metro, PT CLT [phone]  02/16/2023, 11:35 AM

## 2023-02-16 NOTE — Progress Notes (Signed)
   02/16/23 0840  TOC Brief Assessment  Insurance and Status Reviewed  Patient has primary care physician Yes  Home environment has been reviewed From home, spouse  Prior level of function: Independent, family support  Prior/Current Home Services No current home services  Social Drivers of Health Review SDOH reviewed no interventions necessary  Readmission risk has been reviewed Yes  Transition of care needs no transition of care needs at this time      Transition of Care Department [facility]) has reviewed patient and no TOC needs have been identified at this time. We will continue to monitor patient advancement through interdisciplinary progression rounds. If new patient transition needs arise, please place a TOC consult.

## 2023-02-16 NOTE — Discharge Summary (Addendum)
 Physician Discharge Summary   Patient: Timothy Webster MRN: [id number] DOB: Jan 16, 1945  Admit date:     02/14/2023  Discharge date: 02/16/23  Discharge Physician: Alm Zi Sek   PCP: Leigh Lung, MD   Recommendations at discharge:   Please follow up with primary care provider within 1-2 weeks  Please repeat BMP and CBC in one week     Hospital Course: 78 year old male with a history of hypertension, hyperlipidemia, peripheral artery disease, tobacco abuse in remission presenting with altered mental status and confusion.  On the morning of 02/14/2023, the patient woke up later than usual.  His wife subsequently noted that the patient was confused when he woke up.  The patient thought he was at somebody else's house and wanted to get out of the house.  Apparently, the patient was  walking into the walls.  EMS was activated, and the patient was noted to have a temperature of 102 point degrees Fahrenheit. EMS gave the patient ceftriaxone 1 g IM and acetaminophen  given prior to the patient arriving to the emergency department.  In speaking with the patient and his wife, the patient had been having worsening nonproductive cough over the past 2 to 3 days.  There is no recent history of fevers, chills, chest pain, worsening shortness of breath, nausea, vomiting or diarrhea, abdominal pain. At baseline, the patient is independent with his ADLs.  In fact, the patient still drives a car locally.  There is not been any new medications.  In the ED, the patient was febrile to 103.1 F and tachycardic in 110s.  He was hemodynamically stable.  Oxygen saturation was 89% on room air.  He was placed on oxygen 2 L with saturation up to 96%. WBC 7.7, hemoglobin 10.9, platelets 157.  Sodium 136, potassium 3.6, bicarbonate of 19, serum creatinine 1.40.  Chest x-ray showed chronic interstitial prominence.  EKG shows sinus rhythm nonspecific T wave changes.  Lactic acid 1.4>> 0.9.  Influenza A was positive.  The patient  was started on IV fluids after being given 1 L LR.  UA was negative for pyuria.  Assessment and Plan:  Acute respiratory failure with hypoxia -Secondary to COPD exacerbation in the setting of influenza -Oxygen saturation 89% room air -Stable on 2 L>>weaned to RA -Check VBG--7.42/35/60 -Wean oxygen as tolerated   COPD exacerbation -Started Brovana  -Started Pulmicort  -Started DuoNebs -Started Solu-Medrol  -improved-->d/c home with prednisone  taper   Influenza A -Start oseltamavir>>7 more doses after d/c   Acute metabolic encephalopathy -Multifactorial including influenza A and AKI -B12--201>>>begin supplementation daily -TSH--0.217, Free T4--0.99 -UA negative for pyuria -resolved   Acute kidney injury -Secondary to volume depletion -Baseline creatinine 0.8-1.0 -Presented with serum creatinine 1.40 -Continue IV fluids>>improved -Hold benazepril  during hospitalization -serum creatinine 1.08 on day of d/c   Essential hypertension -Restart amlodipine  -Holding benazepril  during hospitalization   Mixed hyperlipidemia -Continue statin and Zetia    Hypomagnesemia/Hypophosphatemia -repleted      Consultants: none Procedures performed: none  Disposition: Home Diet recommendation:  Cardiac diet DISCHARGE MEDICATION: Allergies as of 02/16/2023   No Known Allergies      Medication List     TAKE these medications    acetaminophen  500 MG tablet Commonly known as: TYLENOL  Take 1,000 mg by mouth every 6 (six) hours as needed (pain).   amLODipine -benazepril  10-40 MG capsule Commonly known as: LOTREL Take 1 capsule by mouth daily.   aspirin  EC 81 MG tablet Take 81 mg by mouth in the morning.   brimonidine  0.2 %  ophthalmic solution Commonly known as: ALPHAGAN  Place 1 drop into the right eye 2 (two) times daily.   cilostazol  100 MG tablet Commonly known as: PLETAL  Take 1 tablet by mouth twice daily   cyanocobalamin  500 MCG tablet Commonly known as: VITAMIN  B12 Take 1 tablet (500 mcg total) by mouth daily. Start taking on: February 17, 2023   dorzolamide -timolol  2-0.5 % ophthalmic solution Commonly known as: COSOPT  Place 1 drop into the right eye 2 (two) times daily.   ezetimibe  10 MG tablet Commonly known as: ZETIA  Take 1 tablet by mouth once daily   gabapentin  300 MG capsule Commonly known as: NEURONTIN  TAKE 1 CAPSULE BY MOUTH IN THE MORNING , AT NOON AND AT BEDTIME TRY 3 TIMES A DAY TO SEE IF SYMPTOMS OF LEG PAIN IMPROVES What changed: See the new instructions.   ketorolac  0.5 % ophthalmic solution Commonly known as: ACULAR  Place 1 drop into the right eye 4 (four) times daily.   latanoprost  0.005 % ophthalmic solution Commonly known as: XALATAN  Place 1 drop into the right eye at bedtime.   Muscle Rub 10-15 % Crea Apply 1 application. topically 4 (four) times daily as needed for muscle pain.   nitroGLYCERIN  0.4 MG SL tablet Commonly known as: NITROSTAT  DISSOLVE ONE TABLET UNDER THE TONGUE EVERY 5 MINUTES AS NEEDED FOR CHEST PAIN.  DO NOT EXCEED A TOTAL OF 3 DOSES IN 15 MINUTES   oseltamivir  30 MG capsule Commonly known as: TAMIFLU  Take 1 capsule (30 mg total) by mouth 2 (two) times daily.   predniSONE  10 MG tablet Commonly known as: DELTASONE  Take 6 tablets (60 mg total) by mouth daily with breakfast. And decrease by one tablet daily Start taking on: February 17, 2023   rosuvastatin  20 MG tablet Commonly known as: CRESTOR  Take 1 tablet by mouth once daily        Discharge Exam: Filed Weights   02/15/23 1427  Weight: 60.7 kg   HEENT:  /AT, No thrush, no icterus CV:  RRR, no rub, no S3, no S4 Lung:  bibasilar rales.  No wheeze Abd:  soft/+BS, NT Ext:  No edema, no lymphangitis, no synovitis, no rash  Condition at discharge: stable  The results of significant diagnostics from this hospitalization (including imaging, microbiology, ancillary and laboratory) are listed below for reference.   Imaging  Studies: DG Chest Port 1 View Result Date: 02/14/2023 CLINICAL DATA:  Questionable sepsis EXAM: PORTABLE CHEST 1 VIEW COMPARISON:  Chest x-ray 03/30/2021 FINDINGS: The heart size and mediastinal contours are within normal limits. Both lungs are clear. The visualized skeletal structures are unremarkable. IMPRESSION: No active disease. Electronically Signed   By: Greig Pique M.D.   On: 02/14/2023 19:11    Microbiology: Results for orders placed or performed during the hospital encounter of 02/14/23  Resp panel by RT-PCR (RSV, Flu A&B, Covid) Anterior Nasal Swab     Status: Abnormal   Collection Time: 02/14/23  6:35 PM   Specimen: Anterior Nasal Swab  Result Value Ref Range Status   SARS Coronavirus 2 by RT PCR NEGATIVE NEGATIVE Final    Comment: (NOTE) SARS-CoV-2 target nucleic acids are NOT DETECTED.  The SARS-CoV-2 RNA is generally detectable in upper respiratory specimens during the acute phase of infection. The lowest concentration of SARS-CoV-2 viral copies this assay can detect is 138 copies/mL. A negative result does not preclude SARS-Cov-2 infection and should not be used as the sole basis for treatment or other patient management decisions. A negative result may  occur with  improper specimen collection/handling, submission of specimen other than nasopharyngeal swab, presence of viral mutation(s) within the areas targeted by this assay, and inadequate number of viral copies(<138 copies/mL). A negative result must be combined with clinical observations, patient history, and epidemiological information. The expected result is Negative.  Fact Sheet for Patients:  bloggercourse.com  Fact Sheet for Healthcare Providers:  seriousbroker.it  This test is no t yet approved or cleared by the [place]  FDA and  has been authorized for detection and/or diagnosis of SARS-CoV-2 by FDA under an Emergency Use Authorization (EUA). This  EUA will remain  in effect (meaning this test can be used) for the duration of the COVID-19 declaration under Section 564(b)(1) of the Act, 21 U.S.C.section 360bbb-3(b)(1), unless the authorization is terminated  or revoked sooner.       Influenza A by PCR POSITIVE (A) NEGATIVE Final   Influenza B by PCR NEGATIVE NEGATIVE Final    Comment: (NOTE) The Xpert Xpress SARS-CoV-2/FLU/RSV plus assay is intended as an aid in the diagnosis of influenza from Nasopharyngeal swab specimens and should not be used as a sole basis for treatment. Nasal washings and aspirates are unacceptable for Xpert Xpress SARS-CoV-2/FLU/RSV testing.  Fact Sheet for Patients: bloggercourse.com  Fact Sheet for Healthcare Providers: seriousbroker.it  This test is not yet approved or cleared by the [place]  FDA and has been authorized for detection and/or diagnosis of SARS-CoV-2 by FDA under an Emergency Use Authorization (EUA). This EUA will remain in effect (meaning this test can be used) for the duration of the COVID-19 declaration under Section 564(b)(1) of the Act, 21 U.S.C. section 360bbb-3(b)(1), unless the authorization is terminated or revoked.     Resp Syncytial Virus by PCR NEGATIVE NEGATIVE Final    Comment: (NOTE) Fact Sheet for Patients: bloggercourse.com  Fact Sheet for Healthcare Providers: seriousbroker.it  This test is not yet approved or cleared by the [place]  FDA and has been authorized for detection and/or diagnosis of SARS-CoV-2 by FDA under an Emergency Use Authorization (EUA). This EUA will remain in effect (meaning this test can be used) for the duration of the COVID-19 declaration under Section 564(b)(1) of the Act, 21 U.S.C. section 360bbb-3(b)(1), unless the authorization is terminated or revoked.  Performed at [facility], [address], [place], [place] [address]    Blood Culture (routine x 2)     Status: None (Preliminary result)   Collection Time: 02/14/23  6:48 PM   Specimen: BLOOD  Result Value Ref Range Status   Specimen Description BLOOD RFA  Final   Special Requests NONE  Final   Culture   Final    NO GROWTH 2 DAYS Performed at [facility], [address] [place], [place] [address]    Report Status PENDING  Incomplete  Blood Culture (routine x 2)     Status: None (Preliminary result)   Collection Time: 02/14/23  6:58 PM   Specimen: BLOOD  Result Value Ref Range Status   Specimen Description BLOOD BLOOD RIGHT ARM  Final   Special Requests NONE  Final   Culture   Final    NO GROWTH 2 DAYS Performed at [facility], [address], [place], [place] [address]    Report Status PENDING  Incomplete    Labs: CBC: Recent Labs  Lab 02/14/23 1856 02/15/23 0334  WBC 7.7 6.4  NEUTROABS 7.0  --   HGB 10.9* 10.8*  HCT 33.3* 32.8*  MCV 97.1 97.0  PLT 157 147*  Basic Metabolic Panel: Recent Labs  Lab 02/14/23 1856 02/15/23 0334 02/16/23 0549  NA 136 137 139  K 3.6 3.6 3.7  CL 108 108 109  CO2 19* 20* 21*  GLUCOSE 109* 105* 150*  BUN 15 15 18   CREATININE 1.40* 1.30* 1.08  CALCIUM  9.1 8.8* 8.8*  MG  --  1.6* 2.0  PHOS  --  2.0* 3.2   Liver Function Tests: Recent Labs  Lab 02/14/23 1856 02/15/23 0334  AST 26 32  ALT 17 17  ALKPHOS 41 36*  BILITOT 0.7 0.7  PROT 7.1 6.4*  ALBUMIN 3.9 3.5   CBG: Recent Labs  Lab 02/15/23 0727  GLUCAP 93    Discharge time spent: greater than 30 minutes.  Signed: Alm Schneider, MD [facility] 02/16/2023

## 2023-02-19 LAB — CULTURE, BLOOD (ROUTINE X 2)
Culture: NO GROWTH
Culture: NO GROWTH

## 2023-03-11 DIAGNOSIS — Z6824 Body mass index (BMI) 24.0-24.9, adult: Secondary | ICD-10-CM | POA: Diagnosis not present

## 2023-05-06 DIAGNOSIS — M25511 Pain in right shoulder: Secondary | ICD-10-CM | POA: Diagnosis not present

## 2023-05-13 DIAGNOSIS — H44522 Atrophy of globe, left eye: Secondary | ICD-10-CM | POA: Diagnosis not present

## 2023-05-13 DIAGNOSIS — H401113 Primary open-angle glaucoma, right eye, severe stage: Secondary | ICD-10-CM | POA: Diagnosis not present

## 2023-05-13 DIAGNOSIS — H04129 Dry eye syndrome of unspecified lacrimal gland: Secondary | ICD-10-CM | POA: Diagnosis not present

## 2023-06-10 ENCOUNTER — Ambulatory Visit: Payer: Medicare Other | Admitting: Cardiology

## 2023-06-19 ENCOUNTER — Other Ambulatory Visit: Payer: Self-pay | Admitting: Cardiology

## 2023-06-19 DIAGNOSIS — E7800 Pure hypercholesterolemia, unspecified: Secondary | ICD-10-CM

## 2023-06-25 ENCOUNTER — Other Ambulatory Visit: Payer: Self-pay | Admitting: Cardiology

## 2023-07-01 DIAGNOSIS — H35351 Cystoid macular degeneration, right eye: Secondary | ICD-10-CM | POA: Diagnosis not present

## 2023-07-01 DIAGNOSIS — H401132 Primary open-angle glaucoma, bilateral, moderate stage: Secondary | ICD-10-CM | POA: Diagnosis not present

## 2023-07-01 DIAGNOSIS — H44522 Atrophy of globe, left eye: Secondary | ICD-10-CM | POA: Diagnosis not present

## 2023-07-01 DIAGNOSIS — H35033 Hypertensive retinopathy, bilateral: Secondary | ICD-10-CM | POA: Diagnosis not present

## 2023-07-14 ENCOUNTER — Other Ambulatory Visit: Payer: Self-pay | Admitting: Cardiology

## 2023-07-14 DIAGNOSIS — I10 Essential (primary) hypertension: Secondary | ICD-10-CM

## 2023-07-14 DIAGNOSIS — E7800 Pure hypercholesterolemia, unspecified: Secondary | ICD-10-CM

## 2023-07-15 ENCOUNTER — Telehealth: Payer: Self-pay | Admitting: Cardiology

## 2023-07-15 DIAGNOSIS — I739 Peripheral vascular disease, unspecified: Secondary | ICD-10-CM

## 2023-07-15 MED ORDER — CILOSTAZOL 100 MG PO TABS: 100.0000 mg | ORAL_TABLET | Freq: Two times a day (BID) | ORAL | 0 refills | Status: DC

## 2023-07-15 MED ORDER — EZETIMIBE 10 MG PO TABS: 10.0000 mg | ORAL_TABLET | Freq: Every day | ORAL | 0 refills | Status: DC

## 2023-07-15 NOTE — Telephone Encounter (Signed)
 Pt's medications were sent to pt's pharmacy as requested. Confirmation received.

## 2023-07-15 NOTE — Telephone Encounter (Signed)
*  STAT* If patient is at the pharmacy, call can be transferred to refill team.   1. Which medications need to be refilled? (please list name of each medication and dose if known)   cilostazol  (PLETAL ) 100 MG tablet  ezetimibe  (ZETIA ) 10 MG tablet  2. Which pharmacy/location (including street and city if local pharmacy) is medication to be sent to? [facility] [address] [place] - [id number] [place] [address] Phone: [phone]  Fax: [phone]     3. Do they need a 30 day or 90 day supply? Please refill up to pt's 8/21 appt Pt is out of medication

## 2023-07-29 DIAGNOSIS — H5440 Blindness, one eye, unspecified eye: Secondary | ICD-10-CM | POA: Diagnosis not present

## 2023-07-29 DIAGNOSIS — H44522 Atrophy of globe, left eye: Secondary | ICD-10-CM | POA: Diagnosis not present

## 2023-08-28 ENCOUNTER — Ambulatory Visit: Attending: Cardiology | Admitting: Cardiology

## 2023-08-28 ENCOUNTER — Encounter: Payer: Self-pay | Admitting: Cardiology

## 2023-08-28 VITALS — BP 130/63 | HR 57 | Ht 62.0 in | Wt 130.2 lb

## 2023-08-28 DIAGNOSIS — I739 Peripheral vascular disease, unspecified: Secondary | ICD-10-CM | POA: Diagnosis not present

## 2023-08-28 DIAGNOSIS — E7800 Pure hypercholesterolemia, unspecified: Secondary | ICD-10-CM

## 2023-08-28 DIAGNOSIS — R7989 Other specified abnormal findings of blood chemistry: Secondary | ICD-10-CM

## 2023-08-28 DIAGNOSIS — I10 Essential (primary) hypertension: Secondary | ICD-10-CM

## 2023-08-28 MED ORDER — CLOPIDOGREL BISULFATE 75 MG PO TABS: 75.0000 mg | ORAL_TABLET | Freq: Every day | ORAL | 3 refills | Status: AC

## 2023-08-28 NOTE — Patient Instructions (Signed)
 Medication Instructions:  Stop aspirin  Stop Cilostazol  Start Clopidogrel  75 mg by mouth daily  *If you need a refill on your cardiac medications before your next appointment, please call your pharmacy*  Lab Work: none If you have labs (blood work) drawn today and your tests are completely normal, you will receive your results only by: MyChart Message (if you have MyChart) OR A paper copy in the mail If you have any lab test that is abnormal or we need to change your treatment, we will call you to review the results.  Testing/Procedures: none  Follow-Up: At [facility], you and your health needs are our priority.  As part of our continuing mission to provide you with exceptional heart care, our providers are all part of one team.  This team includes your primary Cardiologist (physician) and Advanced Practice Providers or APPs (Physician Assistants and Nurse Practitioners) who all work together to provide you with the care you need, when you need it.  Your next appointment:   As needed  Provider:   Dr Ladona   We recommend signing up for the patient portal called MyChart.  Sign up information is provided on this After Visit Summary.  MyChart is used to connect with patients for Virtual Visits (Telemedicine).  Patients are able to view lab/test results, encounter notes, upcoming appointments, etc.  Non-urgent messages can be sent to your provider as well.   To learn more about what you can do with MyChart, go to [email or web address].   Other Instructions

## 2023-08-28 NOTE — Progress Notes (Signed)
 Cardiology Office Note:  .   Date:  08/28/2023  ID:  Timothy Webster, DOB Jan 20, 1945, MRN [id number] PCP: Leigh Lung, MD  [facility] Providers Cardiologist:  None   History of Present Illness: .   Timothy Webster is a 78 y.o. AAM male with PAD, hyperlipidemia, tobacco use disorder which he has quit in May 2017.  He has history of bilateral SFA angioplasty and atherectomy and also bilateral iliac artery angioplasty.  He is very compliant with all his medications.  He presents for annual visit.  He has had a exercise Myoview  stress test on 03/22/2021.  No evidence of ischemia, EF 46% although visually appeared normal, low risk.  He is presently doing well and except for minimal symptoms of claudication in the right calf, states that he is doing well with no limitations on physical activity.  He is accompanied by his wife.  Denies chest pain or dyspnea.  He has remained abstinent from tobacco use.  Cardiac Studies relevent.    ABI 11/27/2021: This exam reveals moderately decreased perfusion of the right lower extremity, noted at the dorsalis pedis and post tibial artery level (ABI 0.68).   This exam reveals moderately decreased perfusion of the left lower extremity, noted at the post tibial artery level (ABI 0.75). Severely abnormal monophasic waveform at the level of the ankles. Compared to 05/22/2021, there is improvement in bilateral ABI from right 0.65 and left 0.59.   Discussed the use of AI scribe software for clinical note transcription with the patient, who gave verbal consent to proceed.  History of Present Illness Timothy Webster is a 78 year old male with peripheral artery disease who presents for cardiovascular follow-up. He is accompanied by a family member. He was referred by Dr. Leigh for follow-up on his cardiovascular health and thyroid  function.  He experiences a persistent burning sensation in his leg, which does not significantly impede his ability to walk or perform  daily activities. He is currently taking cilostazol  100 mg twice a day and aspirin , along with B12 supplements. He has a history of smoking but has successfully quit. His weight is stable. He had a low hemoglobin level when hospitalized with influenza in March. Additionally, his thyroid  function was noted to be low during an emergency room visit, necessitating follow-up to ensure normalization.  Labs   Lipoprotein (a)  Date/Time Value Ref Range Status  10/30/2018 09:58 AM 106.3 (H) <75.0 nmol/L Final    Comment:    Note:  Values greater than or equal to 75.0 nmol/L may        indicate an independent risk factor for CHD,        but must be evaluated with caution when applied        to non-Caucasian populations due to the        influence of genetic factors on Lp(a) across        ethnicities.     Recent Labs    02/14/23 1856 02/15/23 0334 02/16/23 0549  NA 136 137 139  K 3.6 3.6 3.7  CL 108 108 109  CO2 19* 20* 21*  GLUCOSE 109* 105* 150*  BUN 15 15 18   CREATININE 1.40* 1.30* 1.08  CALCIUM  9.1 8.8* 8.8*  GFRNONAA 51* 56* >60    Lab Results  Component Value Date   ALT 17 02/15/2023   AST 32 02/15/2023   ALKPHOS 36 (L) 02/15/2023   BILITOT 0.7 02/15/2023      Latest Ref Rng &  Units 02/15/2023    3:34 AM 02/14/2023    6:56 PM 12/03/2021    9:16 AM  CBC  WBC 4.0 - 10.5 K/uL 6.4  7.7  5.6   Hemoglobin 13.0 - 17.0 g/dL 89.1  89.0  87.6   Hematocrit 39.0 - 52.0 % 32.8  33.3  37.7   Platelets 150 - 400 K/uL 147  157  217    No results found for: HGBA1C  Lab Results  Component Value Date   TSH 0.217 (L) 02/15/2023    Care everywhere/Faxed External Labs: Labs 12/03/2021:  Total cholesterol 160, triglycerides 59, HDL 67, LDL 81.  ROS  Review of Systems  Cardiovascular:  Positive for claudication (very mild right calf). Negative for chest pain, dyspnea on exertion and leg swelling.   Physical Exam:   VS:  BP 130/63 (BP Location: Left Arm, Patient Position: Sitting,  Cuff Size: Normal)   Pulse (!) 57   Ht 5' 2 (1.575 m)   Wt 130 lb 3.2 oz (59.1 kg)   SpO2 97%   BMI 23.81 kg/m    Wt Readings from Last 3 Encounters:  08/28/23 130 lb 3.2 oz (59.1 kg)  02/15/23 133 lb 12.8 oz (60.7 kg)  06/10/22 134 lb (60.8 kg)    BP Readings from Last 3 Encounters:  08/28/23 130/63  02/16/23 (!) 132/53  06/10/22 117/63   Physical Exam Neck:     Vascular: No carotid bruit or JVD.  Cardiovascular:     Rate and Rhythm: Normal rate and regular rhythm.     Pulses:          Dorsalis pedis pulses are 0 on the right side and 0 on the left side.       Posterior tibial pulses are 0 on the right side and 0 on the left side.     Heart sounds: Normal heart sounds. No murmur heard.    No gallop.     Comments: Capillary refill time < 2-3 Sec. Feet warm Pulmonary:     Effort: Pulmonary effort is normal.     Breath sounds: Normal breath sounds.  Abdominal:     General: Bowel sounds are normal.     Palpations: Abdomen is soft.  Musculoskeletal:     Right lower leg: No edema.     Left lower leg: No edema.    EKG:         ASSESSMENT AND PLAN: .      ICD-10-CM   1. Claudication in peripheral vascular disease (HCC)  I73.9 clopidogrel  (PLAVIX ) 75 MG tablet    2. Essential hypertension, benign  I10     3. Pure hypercholesterolemia  E78.00     4. Abnormal TSH  R79.89      Assessment & Plan Peripheral artery disease of lower extremities Chronic peripheral artery disease of the lower extremities with intermittent burning sensation in the legs. Circulation is adequate as evidenced by capillary refill. Symptoms are mild and do not significantly limit activities. Risks of invasive procedures outweigh benefits due to mild symptoms. - Stop aspirin  and cilostazol  - Start Plavix  5 mg once daily - Provide prescription for Plavix  for one year with three refills - Instruct to contact primary care provider for future refills  Dyslipidemia Dyslipidemia with unknown  current cholesterol levels. Goal is to maintain LDL cholesterol less than 70 mg/dL to prevent new blockages in the legs. - Request primary care provider to check LDL cholesterol - If LDL is not less than 70 mg/dL,  start Repatha every two weeks - Continue Crestor  20 mg daily and Zetia  10 mg daily.  Anemia Low hemoglobin noted during hospitalization for influenza in March. Decision to stop aspirin  and cilostazol  due to low blood count and assess PAD stable, will start him on Plavix  75 mg daily.  Unless his symptoms of claudication get worse, would recommend medical management and conservative management I will see him back on a as needed basis.  With regard to hypertension, blood pressure under excellent control with amlodipine /benazepril  10/40 mg combination.  Continue the same.   Follow up: As needed as he is stable from PAD standpoint and cardiac standpoint.  Will request PCP to take over all the prescriptions.  I have personally return a letter to Dr. Cozette Potters regarding abnormal labs and lipid management and gave it to the patient.  Signed,  Gordy Bergamo, MD, [facility] 08/28/2023, 5:06 PM [facility] [address] [place], [place] [address] Phone: [phone]. Fax:  [phone]

## 2023-09-02 DIAGNOSIS — E7800 Pure hypercholesterolemia, unspecified: Secondary | ICD-10-CM | POA: Diagnosis not present

## 2023-09-02 DIAGNOSIS — R202 Paresthesia of skin: Secondary | ICD-10-CM | POA: Diagnosis not present

## 2023-09-02 DIAGNOSIS — I10 Essential (primary) hypertension: Secondary | ICD-10-CM | POA: Diagnosis not present

## 2023-09-02 DIAGNOSIS — I119 Hypertensive heart disease without heart failure: Secondary | ICD-10-CM | POA: Diagnosis not present

## 2023-09-02 DIAGNOSIS — I2510 Atherosclerotic heart disease of native coronary artery without angina pectoris: Secondary | ICD-10-CM | POA: Diagnosis not present

## 2023-09-02 DIAGNOSIS — I739 Peripheral vascular disease, unspecified: Secondary | ICD-10-CM | POA: Diagnosis not present

## 2023-09-12 ENCOUNTER — Other Ambulatory Visit: Payer: Self-pay | Admitting: Cardiology

## 2023-09-12 DIAGNOSIS — E7800 Pure hypercholesterolemia, unspecified: Secondary | ICD-10-CM

## 2023-09-12 DIAGNOSIS — I10 Essential (primary) hypertension: Secondary | ICD-10-CM

## 2023-09-16 DIAGNOSIS — H44522 Atrophy of globe, left eye: Secondary | ICD-10-CM | POA: Diagnosis not present

## 2023-09-16 DIAGNOSIS — H401113 Primary open-angle glaucoma, right eye, severe stage: Secondary | ICD-10-CM | POA: Diagnosis not present

## 2023-09-16 DIAGNOSIS — H04129 Dry eye syndrome of unspecified lacrimal gland: Secondary | ICD-10-CM | POA: Diagnosis not present

## 2023-10-06 DIAGNOSIS — H44522 Atrophy of globe, left eye: Secondary | ICD-10-CM | POA: Diagnosis not present

## 2023-10-06 DIAGNOSIS — H35351 Cystoid macular degeneration, right eye: Secondary | ICD-10-CM | POA: Diagnosis not present

## 2023-10-06 DIAGNOSIS — H35033 Hypertensive retinopathy, bilateral: Secondary | ICD-10-CM | POA: Diagnosis not present

## 2023-10-06 DIAGNOSIS — H401112 Primary open-angle glaucoma, right eye, moderate stage: Secondary | ICD-10-CM | POA: Diagnosis not present

## 2023-11-07 ENCOUNTER — Other Ambulatory Visit: Payer: Self-pay | Admitting: Cardiology
